# Patient Record
Sex: Female | Born: 1975 | Race: White | Hispanic: No | Marital: Married | Smoking: Never smoker
Health system: Southern US, Community
[De-identification: ages and names within clinical notes are randomized; demographics above are authoritative.]

## PROBLEM LIST (undated history)

## (undated) DIAGNOSIS — Z98891 History of uterine scar from previous surgery: Secondary | ICD-10-CM

## (undated) DIAGNOSIS — O321XX0 Maternal care for breech presentation, not applicable or unspecified: Secondary | ICD-10-CM

## (undated) DIAGNOSIS — O139 Gestational [pregnancy-induced] hypertension without significant proteinuria, unspecified trimester: Secondary | ICD-10-CM

## (undated) DIAGNOSIS — O1490 Unspecified pre-eclampsia, unspecified trimester: Secondary | ICD-10-CM

## (undated) DIAGNOSIS — T7840XA Allergy, unspecified, initial encounter: Secondary | ICD-10-CM

## (undated) DIAGNOSIS — J3089 Other allergic rhinitis: Secondary | ICD-10-CM

## (undated) DIAGNOSIS — R51 Headache: Secondary | ICD-10-CM

## (undated) HISTORY — DX: Unspecified pre-eclampsia, unspecified trimester: O14.90

## (undated) HISTORY — DX: Allergy, unspecified, initial encounter: T78.40XA

## (undated) HISTORY — DX: Headache: R51

## (undated) HISTORY — DX: Other allergic rhinitis: J30.89

## (undated) HISTORY — DX: Maternal care for breech presentation, not applicable or unspecified: O32.1XX0

## (undated) HISTORY — PX: NASAL SEPTUM SURGERY: SHX37

## (undated) HISTORY — DX: History of uterine scar from previous surgery: Z98.891

---

## 2003-03-02 ENCOUNTER — Emergency Department (HOSPITAL_COMMUNITY): Admission: EM | Admit: 2003-03-02 | Discharge: 2003-03-02 | Payer: Self-pay | Admitting: Emergency Medicine

## 2005-01-23 ENCOUNTER — Emergency Department (HOSPITAL_COMMUNITY): Admission: EM | Admit: 2005-01-23 | Discharge: 2005-01-24 | Payer: Self-pay | Admitting: Emergency Medicine

## 2005-02-11 ENCOUNTER — Ambulatory Visit: Payer: Self-pay | Admitting: Internal Medicine

## 2005-04-07 ENCOUNTER — Ambulatory Visit: Payer: Self-pay | Admitting: Internal Medicine

## 2005-04-07 ENCOUNTER — Other Ambulatory Visit: Admission: RE | Admit: 2005-04-07 | Discharge: 2005-04-07 | Payer: Self-pay | Admitting: Internal Medicine

## 2005-07-21 ENCOUNTER — Ambulatory Visit: Payer: Self-pay | Admitting: Family Medicine

## 2005-07-26 ENCOUNTER — Ambulatory Visit: Payer: Self-pay | Admitting: Internal Medicine

## 2005-07-28 ENCOUNTER — Ambulatory Visit: Payer: Self-pay | Admitting: Cardiology

## 2005-07-28 ENCOUNTER — Encounter: Payer: Self-pay | Admitting: Internal Medicine

## 2005-08-17 ENCOUNTER — Encounter: Payer: Self-pay | Admitting: Internal Medicine

## 2005-10-03 ENCOUNTER — Ambulatory Visit: Payer: Self-pay | Admitting: Internal Medicine

## 2005-10-06 ENCOUNTER — Ambulatory Visit: Payer: Self-pay | Admitting: Internal Medicine

## 2005-10-08 ENCOUNTER — Ambulatory Visit: Payer: Self-pay | Admitting: Family Medicine

## 2005-10-24 ENCOUNTER — Ambulatory Visit: Payer: Self-pay | Admitting: Internal Medicine

## 2006-03-31 ENCOUNTER — Ambulatory Visit: Payer: Self-pay | Admitting: Internal Medicine

## 2006-05-10 ENCOUNTER — Ambulatory Visit: Payer: Self-pay | Admitting: Internal Medicine

## 2006-05-10 ENCOUNTER — Other Ambulatory Visit: Admission: RE | Admit: 2006-05-10 | Discharge: 2006-05-10 | Payer: Self-pay | Admitting: Internal Medicine

## 2006-05-10 ENCOUNTER — Encounter: Payer: Self-pay | Admitting: Internal Medicine

## 2006-05-16 ENCOUNTER — Encounter: Payer: Self-pay | Admitting: Internal Medicine

## 2006-05-16 LAB — CONVERTED CEMR LAB

## 2007-02-27 ENCOUNTER — Encounter: Payer: Self-pay | Admitting: Internal Medicine

## 2007-02-27 DIAGNOSIS — G43909 Migraine, unspecified, not intractable, without status migrainosus: Secondary | ICD-10-CM | POA: Insufficient documentation

## 2007-02-27 DIAGNOSIS — J309 Allergic rhinitis, unspecified: Secondary | ICD-10-CM | POA: Insufficient documentation

## 2007-02-28 ENCOUNTER — Ambulatory Visit: Payer: Self-pay | Admitting: Internal Medicine

## 2007-04-02 ENCOUNTER — Ambulatory Visit: Payer: Self-pay | Admitting: Internal Medicine

## 2007-04-02 LAB — CONVERTED CEMR LAB
ALT: 43 units/L — ABNORMAL HIGH (ref 0–40)
AST: 29 units/L (ref 0–37)
Albumin: 3.6 g/dL (ref 3.5–5.2)
Alkaline Phosphatase: 65 units/L (ref 39–117)
BUN: 13 mg/dL (ref 6–23)
Basophils Absolute: 0 10*3/uL (ref 0.0–0.1)
Basophils Relative: 0.7 % (ref 0.0–1.0)
Bilirubin, Direct: 0.1 mg/dL (ref 0.0–0.3)
CO2: 29 meq/L (ref 19–32)
Calcium: 9 mg/dL (ref 8.4–10.5)
Chloride: 109 meq/L (ref 96–112)
Cholesterol: 231 mg/dL (ref 0–200)
Creatinine, Ser: 0.9 mg/dL (ref 0.4–1.2)
Direct LDL: 153.3 mg/dL
Eosinophils Absolute: 0.1 10*3/uL (ref 0.0–0.6)
Eosinophils Relative: 2.7 % (ref 0.0–5.0)
GFR calc Af Amer: 95 mL/min
GFR calc non Af Amer: 79 mL/min
Glucose, Bld: 99 mg/dL (ref 70–99)
HCT: 41.5 % (ref 36.0–46.0)
HDL: 49.7 mg/dL (ref >39.0)
Hemoglobin: 14.2 g/dL (ref 12.0–15.0)
Lymphocytes Relative: 40 % (ref 12.0–46.0)
MCHC: 34.1 g/dL (ref 30.0–36.0)
MCV: 87 fL (ref 78.0–100.0)
Monocytes Absolute: 0.3 10*3/uL (ref 0.2–0.7)
Monocytes Relative: 6.4 % (ref 3.0–11.0)
Neutro Abs: 2.5 10*3/uL (ref 1.4–7.7)
Neutrophils Relative %: 50.2 % (ref 43.0–77.0)
Platelets: 255 10*3/uL (ref 150–400)
Potassium: 4.2 meq/L (ref 3.5–5.1)
RBC: 4.78 M/uL (ref 3.87–5.11)
RDW: 11.5 % (ref 11.5–14.6)
Sodium: 140 meq/L (ref 135–145)
TSH: 2.58 microintl units/mL (ref 0.35–5.50)
Total Bilirubin: 0.8 mg/dL (ref 0.3–1.2)
Total CHOL/HDL Ratio: 4.6
Total Protein: 7.2 g/dL (ref 6.0–8.3)
Triglycerides: 68 mg/dL (ref 0–149)
VLDL: 14 mg/dL (ref 0–40)
WBC: 4.8 10*3/uL (ref 4.5–10.5)

## 2007-05-08 ENCOUNTER — Encounter: Payer: Self-pay | Admitting: Internal Medicine

## 2007-05-08 ENCOUNTER — Other Ambulatory Visit: Admission: RE | Admit: 2007-05-08 | Discharge: 2007-05-08 | Payer: Self-pay | Admitting: Internal Medicine

## 2007-05-08 ENCOUNTER — Ambulatory Visit: Payer: Self-pay | Admitting: Internal Medicine

## 2007-05-08 DIAGNOSIS — R7401 Elevation of levels of liver transaminase levels: Secondary | ICD-10-CM | POA: Insufficient documentation

## 2007-05-08 DIAGNOSIS — R740 Nonspecific elevation of levels of transaminase and lactic acid dehydrogenase [LDH]: Secondary | ICD-10-CM

## 2007-05-08 DIAGNOSIS — E785 Hyperlipidemia, unspecified: Secondary | ICD-10-CM | POA: Insufficient documentation

## 2007-05-09 ENCOUNTER — Encounter: Payer: Self-pay | Admitting: Internal Medicine

## 2007-07-04 ENCOUNTER — Ambulatory Visit: Payer: Self-pay | Admitting: Internal Medicine

## 2007-07-04 LAB — CONVERTED CEMR LAB
ALT: 35 units/L (ref 0–35)
AST: 29 units/L (ref 0–37)
Albumin: 3.6 g/dL (ref 3.5–5.2)
Alkaline Phosphatase: 71 units/L (ref 39–117)
Bilirubin, Direct: 0.1 mg/dL (ref 0.0–0.3)
Cholesterol: 213 mg/dL (ref 0–200)
Direct LDL: 149.2 mg/dL
HDL: 47.6 mg/dL (ref >39.0)
Total Bilirubin: 0.7 mg/dL (ref 0.3–1.2)
Total CHOL/HDL Ratio: 4.5
Total Protein: 7 g/dL (ref 6.0–8.3)
Triglycerides: 54 mg/dL (ref 0–149)
VLDL: 11 mg/dL (ref 0–40)

## 2007-07-11 ENCOUNTER — Ambulatory Visit: Payer: Self-pay | Admitting: Internal Medicine

## 2007-07-11 DIAGNOSIS — Z8719 Personal history of other diseases of the digestive system: Secondary | ICD-10-CM | POA: Insufficient documentation

## 2007-07-11 DIAGNOSIS — R51 Headache: Secondary | ICD-10-CM | POA: Insufficient documentation

## 2007-07-11 DIAGNOSIS — R519 Headache, unspecified: Secondary | ICD-10-CM | POA: Insufficient documentation

## 2007-08-14 ENCOUNTER — Ambulatory Visit: Payer: Self-pay | Admitting: Internal Medicine

## 2007-08-14 DIAGNOSIS — F4321 Adjustment disorder with depressed mood: Secondary | ICD-10-CM | POA: Insufficient documentation

## 2007-08-14 DIAGNOSIS — G4700 Insomnia, unspecified: Secondary | ICD-10-CM | POA: Insufficient documentation

## 2007-08-21 LAB — CONVERTED CEMR LAB
Free T4: 0.7 ng/dL (ref 0.6–1.6)
TSH: 2.11 microintl units/mL (ref 0.35–5.50)

## 2007-09-10 ENCOUNTER — Ambulatory Visit: Payer: Self-pay | Admitting: Internal Medicine

## 2007-09-20 ENCOUNTER — Encounter: Payer: Self-pay | Admitting: Internal Medicine

## 2007-10-01 ENCOUNTER — Ambulatory Visit: Payer: Self-pay | Admitting: Internal Medicine

## 2007-11-06 ENCOUNTER — Ambulatory Visit: Payer: Self-pay | Admitting: Internal Medicine

## 2007-11-06 DIAGNOSIS — L708 Other acne: Secondary | ICD-10-CM | POA: Insufficient documentation

## 2007-11-21 ENCOUNTER — Telehealth: Payer: Self-pay | Admitting: Internal Medicine

## 2007-11-26 ENCOUNTER — Telehealth: Payer: Self-pay | Admitting: Internal Medicine

## 2007-11-30 ENCOUNTER — Encounter: Payer: Self-pay | Admitting: Internal Medicine

## 2008-01-14 ENCOUNTER — Ambulatory Visit: Payer: Self-pay | Admitting: Internal Medicine

## 2008-04-01 ENCOUNTER — Telehealth: Payer: Self-pay | Admitting: *Deleted

## 2008-06-04 ENCOUNTER — Ambulatory Visit: Payer: Self-pay | Admitting: Internal Medicine

## 2008-06-04 LAB — CONVERTED CEMR LAB
Bilirubin Urine: NEGATIVE
Glucose, Urine, Semiquant: NEGATIVE
Ketones, urine, test strip: NEGATIVE
Nitrite: NEGATIVE
Protein, U semiquant: NEGATIVE
Specific Gravity, Urine: >=1.03
Urobilinogen, UA: 0.2
WBC Urine, dipstick: NEGATIVE
pH: 5

## 2008-06-12 LAB — CONVERTED CEMR LAB
ALT: 40 units/L — ABNORMAL HIGH (ref 0–35)
AST: 35 units/L (ref 0–37)
Albumin: 3.6 g/dL (ref 3.5–5.2)
Alkaline Phosphatase: 65 units/L (ref 39–117)
BUN: 11 mg/dL (ref 6–23)
Basophils Absolute: 0.1 10*3/uL (ref 0.0–0.1)
Basophils Relative: 1 % (ref 0.0–3.0)
Bilirubin, Direct: 0.1 mg/dL (ref 0.0–0.3)
CO2: 28 meq/L (ref 19–32)
Calcium: 9 mg/dL (ref 8.4–10.5)
Chloride: 108 meq/L (ref 96–112)
Cholesterol: 193 mg/dL (ref 0–200)
Creatinine, Ser: 1 mg/dL (ref 0.4–1.2)
Eosinophils Absolute: 0.2 10*3/uL (ref 0.0–0.7)
Eosinophils Relative: 3 % (ref 0.0–5.0)
GFR calc Af Amer: 84 mL/min
GFR calc non Af Amer: 69 mL/min
Glucose, Bld: 108 mg/dL — ABNORMAL HIGH (ref 70–99)
HCT: 40.9 % (ref 36.0–46.0)
HDL: 44.4 mg/dL (ref >39.0)
Hemoglobin: 14.3 g/dL (ref 12.0–15.0)
LDL Cholesterol: 134 mg/dL — ABNORMAL HIGH (ref 0–99)
Lymphocytes Relative: 29 % (ref 12.0–46.0)
MCHC: 34.9 g/dL (ref 30.0–36.0)
MCV: 88.8 fL (ref 78.0–100.0)
Monocytes Absolute: 0.3 10*3/uL (ref 0.1–1.0)
Monocytes Relative: 5.8 % (ref 3.0–12.0)
Neutro Abs: 3.6 10*3/uL (ref 1.4–7.7)
Neutrophils Relative %: 61.2 % (ref 43.0–77.0)
Platelets: 306 10*3/uL (ref 150–400)
Potassium: 4 meq/L (ref 3.5–5.1)
RBC: 4.6 M/uL (ref 3.87–5.11)
RDW: 12.1 % (ref 11.5–14.6)
Sodium: 143 meq/L (ref 135–145)
TSH: 2.84 microintl units/mL (ref 0.35–5.50)
Total Bilirubin: 0.5 mg/dL (ref 0.3–1.2)
Total CHOL/HDL Ratio: 4.3
Total Protein: 7.2 g/dL (ref 6.0–8.3)
Triglycerides: 71 mg/dL (ref 0–149)
VLDL: 14 mg/dL (ref 0–40)
WBC: 5.9 10*3/uL (ref 4.5–10.5)

## 2008-07-16 ENCOUNTER — Ambulatory Visit: Payer: Self-pay | Admitting: Internal Medicine

## 2008-07-16 DIAGNOSIS — R739 Hyperglycemia, unspecified: Secondary | ICD-10-CM | POA: Insufficient documentation

## 2008-07-16 LAB — CONVERTED CEMR LAB: Blood Glucose, Fingerstick: 114

## 2008-07-22 ENCOUNTER — Telehealth: Payer: Self-pay | Admitting: Internal Medicine

## 2008-08-13 ENCOUNTER — Ambulatory Visit: Payer: Self-pay | Admitting: Internal Medicine

## 2008-08-13 LAB — CONVERTED CEMR LAB
Hep B Core Total Ab: NEGATIVE
Hep B S Ab: NEGATIVE

## 2008-08-14 ENCOUNTER — Encounter: Payer: Self-pay | Admitting: Internal Medicine

## 2008-08-14 LAB — CONVERTED CEMR LAB
Albumin: 3.6 g/dL (ref 3.5–5.2)
Alkaline Phosphatase: 66 units/L (ref 39–117)
Bilirubin, Direct: 0.1 mg/dL (ref 0.0–0.3)
Total Bilirubin: 0.6 mg/dL (ref 0.3–1.2)

## 2008-12-17 ENCOUNTER — Encounter: Payer: Self-pay | Admitting: Internal Medicine

## 2009-05-05 ENCOUNTER — Telehealth: Payer: Self-pay | Admitting: *Deleted

## 2009-08-31 ENCOUNTER — Ambulatory Visit: Payer: Self-pay | Admitting: Internal Medicine

## 2009-08-31 LAB — CONVERTED CEMR LAB
ALT: 25 units/L (ref 0–35)
Albumin: 3.7 g/dL (ref 3.5–5.2)
BUN: 11 mg/dL (ref 6–23)
Basophils Relative: 0.9 % (ref 0.0–3.0)
Bilirubin Urine: NEGATIVE
Chloride: 105 meq/L (ref 96–112)
Cholesterol: 186 mg/dL (ref 0–200)
Eosinophils Relative: 1.5 % (ref 0.0–5.0)
Glucose, Urine, Semiquant: NEGATIVE
HCT: 42 % (ref 36.0–46.0)
Hemoglobin: 14.4 g/dL (ref 12.0–15.0)
LDL Cholesterol: 118 mg/dL — ABNORMAL HIGH (ref 0–99)
Lymphs Abs: 2 10*3/uL (ref 0.7–4.0)
MCV: 88.5 fL (ref 78.0–100.0)
Monocytes Absolute: 0.4 10*3/uL (ref 0.1–1.0)
Neutro Abs: 5.5 10*3/uL (ref 1.4–7.7)
Platelets: 273 10*3/uL (ref 150.0–400.0)
Potassium: 4.1 meq/L (ref 3.5–5.1)
RBC: 4.75 M/uL (ref 3.87–5.11)
TSH: 2.71 microintl units/mL (ref 0.35–5.50)
Total Protein: 7.5 g/dL (ref 6.0–8.3)
Urobilinogen, UA: 0.2
WBC: 8.1 10*3/uL (ref 4.5–10.5)

## 2009-09-14 ENCOUNTER — Ambulatory Visit: Payer: Self-pay | Admitting: Internal Medicine

## 2009-09-14 DIAGNOSIS — N970 Female infertility associated with anovulation: Secondary | ICD-10-CM | POA: Insufficient documentation

## 2009-09-14 LAB — CONVERTED CEMR LAB: Blood Glucose, Fingerstick: 111

## 2009-12-07 ENCOUNTER — Ambulatory Visit: Payer: Self-pay | Admitting: Internal Medicine

## 2009-12-14 ENCOUNTER — Ambulatory Visit: Payer: Self-pay | Admitting: Internal Medicine

## 2009-12-15 ENCOUNTER — Encounter: Payer: Self-pay | Admitting: Internal Medicine

## 2010-02-10 ENCOUNTER — Telehealth: Payer: Self-pay | Admitting: *Deleted

## 2010-02-10 ENCOUNTER — Ambulatory Visit: Payer: Self-pay | Admitting: Internal Medicine

## 2010-03-02 ENCOUNTER — Ambulatory Visit: Payer: Self-pay | Admitting: Internal Medicine

## 2010-04-12 ENCOUNTER — Telehealth: Payer: Self-pay | Admitting: *Deleted

## 2010-04-23 ENCOUNTER — Ambulatory Visit: Payer: Self-pay | Admitting: Internal Medicine

## 2010-05-12 ENCOUNTER — Encounter: Payer: Self-pay | Admitting: Internal Medicine

## 2010-05-17 ENCOUNTER — Telehealth: Payer: Self-pay | Admitting: *Deleted

## 2010-09-07 ENCOUNTER — Ambulatory Visit: Payer: Self-pay | Admitting: Internal Medicine

## 2010-09-07 LAB — CONVERTED CEMR LAB
AST: 16 units/L (ref 0–37)
Albumin: 2.7 g/dL — ABNORMAL LOW (ref 3.5–5.2)
Alkaline Phosphatase: 64 units/L (ref 39–117)
Basophils Absolute: 0 10*3/uL (ref 0.0–0.1)
Bilirubin Urine: NEGATIVE
Bilirubin, Direct: 0 mg/dL (ref 0.0–0.3)
Blood in Urine, dipstick: NEGATIVE
Calcium: 8.8 mg/dL (ref 8.4–10.5)
Cholesterol: 276 mg/dL — ABNORMAL HIGH (ref 0–200)
Creatinine, Ser: 0.7 mg/dL (ref 0.4–1.2)
Eosinophils Absolute: 0.1 10*3/uL (ref 0.0–0.7)
GFR calc non Af Amer: 96.09 mL/min (ref >60)
Glucose, Bld: 120 mg/dL — ABNORMAL HIGH (ref 70–99)
HDL: 78.8 mg/dL (ref >39.00)
Hemoglobin: 12.7 g/dL (ref 12.0–15.0)
Ketones, urine, test strip: NEGATIVE
Lymphocytes Relative: 18.1 % (ref 12.0–46.0)
MCHC: 33.9 g/dL (ref 30.0–36.0)
Monocytes Relative: 4.8 % (ref 3.0–12.0)
Neutro Abs: 6.9 10*3/uL (ref 1.4–7.7)
Neutrophils Relative %: 75.9 % (ref 43.0–77.0)
Nitrite: NEGATIVE
RDW: 13.4 % (ref 11.5–14.6)
Sodium: 139 meq/L (ref 135–145)
Total Bilirubin: 0.3 mg/dL (ref 0.3–1.2)
Total CHOL/HDL Ratio: 4
Triglycerides: 176 mg/dL — ABNORMAL HIGH (ref 0.0–149.0)
Urobilinogen, UA: 0.2

## 2010-09-15 ENCOUNTER — Ambulatory Visit: Payer: Self-pay | Admitting: Internal Medicine

## 2010-09-15 DIAGNOSIS — R809 Proteinuria, unspecified: Secondary | ICD-10-CM | POA: Insufficient documentation

## 2010-09-20 LAB — CONVERTED CEMR LAB
Creatinine,U: 122.2 mg/dL
Microalb Creat Ratio: 0.9 mg/g (ref 0.0–30.0)
Microalb, Ur: 1.1 mg/dL (ref 0.0–1.9)

## 2010-11-09 ENCOUNTER — Encounter
Admission: RE | Admit: 2010-11-09 | Discharge: 2010-11-16 | Payer: Self-pay | Source: Home / Self Care | Attending: Obstetrics and Gynecology | Admitting: Obstetrics and Gynecology

## 2010-11-10 ENCOUNTER — Encounter
Admit: 2010-11-10 | Discharge: 2010-11-16 | Payer: Self-pay | Attending: Obstetrics and Gynecology | Admitting: Obstetrics and Gynecology

## 2010-11-16 NOTE — Assessment & Plan Note (Signed)
Summary: CPX/CJR   Vital Signs:  Patient profile:   35 year old female Menstrual status:  regular LMP:     03/17/2010 Height:      67 inches Weight:      236 pounds Temp:     98.9 degrees F oral Pulse rate:   110 / minute BP sitting:   112 / 74  (left arm) Cuff size:   large  Vitals Entered By: Alfred Levins, CMA (September 15, 2010 2:06 PM) CC: cpx, no pap LMP (date): 03/17/2010 EDC by LMP==> 12/30/2010 EDC 12/30/2010 LMP - Character: light LMP - Reliable? approximate (month known) Menarche (age onset years): 12-13   Menses interval (days): 28 Menstrual flow (days): 5-6 Enter LMP: 03/17/2010 Last PAP Result normal   History of Present Illness: Sandra Hudson comes in today  for preventive visit . Since last visit  here  there have been no major changes in health status  but  is pregnant [redacted] weeks or so  and now not working . thus stress is less.   Her  HAs are better.   EDC march 15 . No complicaitons of pregnancy.   nl bp and initial bg screen negative but rechecking  next week.  had uti early in november now better . Mood: so so difficult to tell with  pregancy and  staying at home.  Takind wellbutrin Herman and ? if cymbalta or prozac .   Seeing counselor reg who rec stay on med if possible  until 4 months PP  to avoid relapse.    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 1 By Mouth Once Daily 2)  Zolpidem Tartrate 10 Mg Tabs (Zolpidem Tartrate) .Marland Kitchen.. 1 By Mouth At Bedtime 3)  Wellbutrin Xl 150 Mg  Tb24 (Bupropion Hcl) .... 2 By Mouth Once Daily 4)  Flonase 50 Mcg/act Susp (Fluticasone Propionate) .... 2 Sprays Each Nares Q D 5)  Cymbalta 60 Mg Cpep (Duloxetine Hcl) .Marland Kitchen.. 1 By Mouth Once Daily 6)  Ativan 0.5 Mg Tabs (Lorazepam) .Marland Kitchen.. 1 By Mouth Two Times A Day As Needed Anxiety  Allergies (verified): No Known Drug Allergies  Past History:  Past medical, surgical, family and social histories (including risk factors) reviewed, and no changes noted (except as noted below).  Past Medical History: Reviewed history  from 02/10/2010 and no changes required. Allergic rhinitis dust mites See preload Headache Infertility treatment [facility]  bates ent midwife  Past Surgical History: Reviewed history from 04/23/2010 and no changes required. Nasal Surgery for Deviated Septum and large turbinate  Past History:  Care Management: ENT: Jenne Pane Gynecology: Erin Sons- Midwife   central [facility]   Psychologist: Gunnar Fusi PIle   Family History: Reviewed history from 12/14/2009 and no changes required. See Preload no thyroid disease. Father has thrasing light sleeper. Multiple system atrophy  progressing.  .   died 1 2010/03/03  Social History: Reviewed history from 12/14/2009 and no changes required. 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   Review of Systems  The patient denies abdominal pain, melena, hematochezia, severe indigestion/heartburn, muscle weakness, abnormal bleeding, enlarged lymph nodes, angioedema, and breast masses.         fell down stairs  a while back  and had  elbow. pain  nl now. Neg cv pulmon  gi ,gu changes .    Physical Exam  General:  Well-developed,well-nourished,in no acute distress; alert,appropriate and cooperative throughout examination Head:  normocephalic and atraumatic.   Eyes:  PERRL, EOMs full, conjunctiva clear  Ears:  R ear normal, L ear normal, and no external deformities.   Nose:  no external deformity, no external erythema, and no nasal discharge.   Mouth:  good dentition and pharynx pink and moist.   Neck:  No deformities, masses, or tenderness noted. Breasts:  No mass, nodules, thickening, tenderness, bulging, retraction, inflamation, nipple discharge or skin changes noted.   Lungs:  Normal respiratory effort, chest expands symmetrically. Lungs are clear to auscultation, no crackles or wheezes. Heart:  Normal rate and regular rhythm. S1 and S2 normal without gallop, murmur, click, rub or other extra sounds.no gallop.   Abdomen:  soft, non-tender, no hepatomegaly, and no splenomegaly.  fundul up tp below umbilicus non tender Msk:  no joint swelling, no joint warmth, and no redness over joints.   Pulses:  pulses intact without delay   Extremities:  no clubbing cyanosis or edema  Neurologic:  alert & oriented X3, strength normal in all extremities, and gait normal.  non focal  Skin:  turgor normal, color normal, no petechiae, and no purpura.  acne on back som pustules Cervical Nodes:  No lymphadenopathy noted Axillary Nodes:  No palpable lymphadenopathy Inguinal Nodes:  No significant adenopathy Psych:  Oriented X3, memory  intact for recent and remote, good eye contact, not anxious appearing, and not depressed appearing.  midly subdued    Impression & Recommendations:  Problem # 1:  PREVENTIVE HEALTH CARE (ICD-V70.0) Discussed nutrition,exercise,diet,healthy weight, vitamin D and calcium.   utd on shots   Problem # 2:  DISORDER, ADJUSTMENT W/DEPRESSED MOOD (ICD-309.0) Assessment: Unchanged stable  discmeds  will confirm meds and dosing and will do refill s / if on fluoextine instead of cymbalta  Problem # 3:  HYPERGLYCEMIA (ICD-790.29) fbs 120 and pregnancy    to be tested soon  will send results to gyne  Problem # 4:  PROTEINURIA (ICD-791.0) with albumin 2.9 / dilutional   check urine   had recent uti and could be related Orders: TLB-Microalbumin/Creat Ratio, Urine (82043-MALB)  Problem # 5:  HYPERLIPIDEMIA NEC/NOS (ICD-272.4) high  lifestyle intervention compatable with pregnancy  Labs Reviewed: SGOT:  16 (09/07/2010)   SGPT: 16 (09/07/2010)   HDL:78.80 (09/07/2010), 46.70 (08/31/2009)  LDL:118 (08/31/2009), 134 (06/04/2008)  Chol:276 (09/07/2010), 186 (08/31/2009)  Trig:176.0 (09/07/2010), 105.0 (08/31/2009)  Problem # 6:  HEADACHE (ICD-784.0) Assessment: Improved  The following medications were removed from the medication list:    Imitrex 100 Mg Tabs (Sumatriptan succinate) .Marland Kitchen... Take 1 tablet by mouth as directed  Problem # 7:  PREGNANT STATE, INCIDENTAL (ICD-V22.2) disc tdap  and fluimmunix  for care givers and HH members    Problem # 8:  albumin 2.9  check urine may be dilutional  Complete Medication List: 1)  Zyrtec Allergy 10 Mg Tabs (Cetirizine hcl) .Marland Kitchen.. 1 by mouth once daily 2)  Zolpidem Tartrate 10 Mg Tabs (Zolpidem tartrate) .Marland Kitchen.. 1 by mouth at bedtime 3)  Wellbutrin Xl 150 Mg Tb24 (Bupropion hcl) .... 2 by mouth once daily 4)  Flonase 50 Mcg/act Susp (Fluticasone propionate) .... 2 sprays each nares q d 5)  Cymbalta 60 Mg Cpep (Duloxetine hcl) .Marland Kitchen.. 1 by mouth once daily 6)   Ativan 0.5 Mg Tabs (Lorazepam) .Marland Kitchen.. 1 by mouth two times a day as needed anxiety  Patient Instructions: 1)  will send  results  to your ob gyne office  2)  healthy eating. 3)  You will be informed of lab results when available.  4)  have pharmacy call with other refills   5)  rov in 6 months or when appropriate.  Prescriptions: WELLBUTRIN XL 150 MG  TB24 (BUPROPION HCL) 2 by mouth once daily  #60 Tablet x 5   Entered and Authorized by:   [name] MD   Signed by:   [name] MD on 09/15/2010   Method used:   Electronically to        [facility]. [phone]* (retail)       [phone]       [place], [place]  [address]       Ph: [phone]       Fax: [phone]   RxID:   [id number] ZOLPIDEM TARTRATE 10 MG TABS (ZOLPIDEM TARTRATE) 1 by mouth at bedtime  #30 x 3   Entered and Authorized by:   [name] MD   Signed by:   [name] MD on 09/15/2010   Method used:   Print then Give to Patient   RxID:   [id number]    Orders Added: 1)  TLB-Microalbumin/Creat Ratio, Urine [82043-MALB] 2)  Est. Patient 18-39 years [99395] 3)  Est. Patient Level II [59563]

## 2010-11-16 NOTE — Medication Information (Signed)
Summary: Coverage for Lunesta Denied  Coverage for Lunesta Denied   Imported By: Maryln Gottron 05/21/2010 13:09:22  _____________________________________________________________________  External Attachment:    Type:   Image     Comment:   External Document

## 2010-11-16 NOTE — Medication Information (Signed)
Summary: Prior Authorization Request and Approval for Lunesta  Prior Authorization Request and Approval for Lunesta   Imported By: Maryln Gottron 12/18/2009 11:23:11  _____________________________________________________________________  External Attachment:    Type:   Image     Comment:   External Document

## 2010-11-16 NOTE — Assessment & Plan Note (Signed)
Summary: 3 month fup//ccm   Vital Signs:  Patient profile:   35 year old female Menstrual status:  regular LMP:     12/09/2009 Weight:      212 pounds Pulse rate:   72 / minute BP sitting:   110 / 60  (right arm) Cuff size:   large  Vitals Entered By: Romualdo Bolk, CMA (AAMA) (December 14, 2009 1:43 PM) CC: Follow-up visit on meds LMP (date): 12/09/2009 LMP - Character: light LMP - Reliable? Yes Menarche (age onset years): 12-13   Menses interval (days): 28 Menstrual flow (days): 5-6 Enter LMP: 12/09/2009 Last PAP Result normal   History of Present Illness: Sandra Hudson comes in  today for  fu of a number of issues : Since last visit her father has died and   work situation is abit stressful . HA   ocass  use of meds   periods  1-2 x per months doing better since on sleep meds . Sleep : still issue at times  lunesta helps  Mood.  Ok considering fathers death from debilitating disease. in January.  no se of meds .   Fertility :   back onocps for awhile  willbe going off later. BG not checking at present.   Preventive Screening-Counseling & Management  Alcohol-Tobacco     Alcohol drinks/day: <1     Alcohol type: wine, mixed drinks     Smoking Status: never  Caffeine-Diet-Exercise     Caffeine use/day: less than 1 a day     Does Patient Exercise: no  Current Medications (verified): 1)  Imitrex 100 Mg Tabs (Sumatriptan Succinate) .... Take 1 Tablet By Mouth As Directed 2)  Zyrtec Allergy 10 Mg  Tabs (Cetirizine Hcl) .Marland Kitchen.. 1 By Mouth Once Daily 3)  Lunesta 3 Mg  Tabs (Eszopiclone) .Marland Kitchen.. 1 Hs As Directed 4)  Fluoxetine Hcl 20 Mg  Tabs (Fluoxetine Hcl) .Marland Kitchen.. 1 By Mouth Two Times A Day 5)  Wellbutrin Xl 150 Mg  Tb24 (Bupropion Hcl) .... 2 By Mouth Once Daily 6)  Flonase 50 Mcg/act Susp (Fluticasone Propionate) .... 2 Sprays Each Nares Q D 7)  Yaz 3-0.02 Mg Tabs (Drospirenone-Ethinyl Estradiol)  Allergies (verified): No Known Drug Allergies  Past History:  Past  medical, surgical, family and social histories (including risk factors) reviewed, and no changes noted (except as noted below).  Past Medical History: Reviewed history from 07/16/2008 and no changes required. Allergic rhinitis dust mites See preload Headache  [facility]  bates ent midwife  Past Surgical History: Reviewed history from 09/10/2007 and no changes required. Nasal Surgery for Devated Spetum and large turbinate  Past History:  Care Management: ENT: Jenne Pane Gynecology: Erin Sons- Midwife  Family History: Reviewed history from 09/14/2009 and no changes required. See Preload no thyroid disease. Father has thrasing light sleeper. Multiple system atrophy  progressing.  .   died 1 2010/02/26  Social History: Reviewed history from 07/16/2008 and no changes required. 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    Physical Exam  General:  alert, well-developed, well-nourished, and well-hydrated.   Head:  normocephalic, atraumatic, and no abnormalities observed.  Eyes:  vision grossly intact and pupils equal.   Neck:  No deformities, masses, or tenderness noted. Lungs:  Normal respiratory effort, chest expands symmetrically. Lungs are clear to auscultation, no crackles or wheezes.no dullness.   Heart:  Normal rate and regular rhythm. S1 and S2 normal without gallop, murmur, click, rub or other extra sounds. Abdomen:  Bowel sounds positive,abdomen soft and non-tender without masses, organomegaly or    noted. Pulses:  nl cap refill  Extremities:  no clubbing cyanosis or edema  Neurologic:  non focal alert & oriented X3 and gait normal.   Skin:  turgor normal, color normal, no ecchymoses, and no petechiae.   Cervical Nodes:  No lymphadenopathy noted Psych:  Oriented X3, memory intact for recent and remote, normally interactive, good eye contact, not anxious appearing, and subdued.   normal for situation and speech nl.    Impression & Recommendations:  Problem # 1:  HYPERGLYCEMIA (ICD-790.29) Assessment Improved hg a1c 5.8    Problem # 2:  HEADACHE (ICD-784.0) Assessment: Improved stable  Her updated medication list for this problem includes:    Imitrex 100 Mg Tabs (Sumatriptan succinate) .Marland Kitchen... Take 1 tablet by mouth as directed  Problem # 3:  DISORDER, ADJUSTMENT W/DEPRESSED MOOD (ICD-309.0) doing as expected with fathers death   counseled   Problem # 4:  SLEEPLESSNESS (ICD-780.52) Assessment: Unchanged stable Her updated medication list for this problem includes:    Lunesta 3 Mg Tabs (Eszopiclone) .Marland Kitchen... 1 hs as directed  Problem # 5:  INFERTILITY, ANOVULATORY (ICD-628.0) will try again later  weight loss would help.     Complete Medication List: 1)  Imitrex 100 Mg Tabs (Sumatriptan succinate) .... Take 1 tablet by mouth as directed 2)  Zyrtec Allergy 10 Mg Tabs (Cetirizine hcl) .Marland Kitchen.. 1 by mouth once daily 3)  Lunesta 3 Mg Tabs (Eszopiclone) .Marland Kitchen.. 1 hs as directed 4)  Fluoxetine Hcl 20 Mg Tabs (Fluoxetine hcl) .Marland Kitchen.. 1 by mouth two times a day 5)  Wellbutrin Xl 150 Mg Tb24 (Bupropion hcl) .... 2 by mouth once daily 6)  Flonase 50 Mcg/act Susp (Fluticasone propionate) .... 2 sprays each nares q d 7)  Yaz 3-0.02 Mg Tabs (Drospirenone-ethinyl estradiol)  Patient Instructions: 1)  no change  in medicine today  2)  call if   needed otherwise   get cpx with labs in the summer. Prescriptions: LUNESTA 3 MG  TABS (ESZOPICLONE) 1 hs as directed  #30 x 5   Entered and Authorized by:    [name] MD   Signed by:   [name] MD on 12/14/2009   Method used:   Print then Give to Patient   RxID:   [id number] FLUOXETINE HCL 20 MG  TABS (FLUOXETINE HCL) 1 by mouth two times a day  #60 Capsule x 6   Entered and Authorized by:   [name] MD   Signed by:   [name] MD on 12/14/2009   Method used:   Electronically to        [facility]. [phone]* (retail)       [phone]       [place], [place]  [address]       Ph: [phone]       Fax: [phone]   RxID:   [id number] WELLBUTRIN XL 150 MG  TB24 (BUPROPION HCL) 2 by mouth once daily  #60 x 5   Entered and Authorized  by:   [name] MD   Signed by:   [name] MD on 12/14/2009   Method used:   Electronically to        [facility]. [phone]* (retail)       [phone]       [place], [place]  [address]       Ph: [phone]       Fax: [phone]   RxID:   [id number]  greater than 50% of visit spent in counseling   25 min

## 2010-11-16 NOTE — Progress Notes (Signed)
  Phone Note Other Incoming   Caller: Dr Gunnar Fusi PIle Psychologist  Summary of Call: Counselor  saw pt today and suggested  adding low dose benzo  short term to see if would help  situation  until better . She also rec to pt to take off a few days and follow up . agreed with changing med type.   Will do this tomorrow am.  Please call in Ativan .5 mg  1  by mouth two times a day  as needed for anxiety.   Disp 30  # call for refills before visit if needed. Please call patient when it has been sent in  Initial call taken by: [name] MD,  February 10, 2010 7:10 PM  Follow-up for Phone Call        Left message for pt to call back. Rx called into [facility]. Follow-up by: Romualdo Bolk, CMA Duncan Dull),  February 11, 2010 9:09 AM  Additional Follow-up for Phone Call Additional follow up Details #1::        Pt aware and wants rx called into [facility] Additional Follow-up by: Romualdo Bolk, CMA (AAMA),  February 11, 2010 9:59 AM    New/Updated Medications: ATIVAN 0.5 MG TABS (LORAZEPAM) 1 by mouth two times a day as needed anxiety Prescriptions: ATIVAN 0.5 MG TABS (LORAZEPAM) 1 by mouth two times a day as needed anxiety  #30 x 0   Entered by:   Romualdo Bolk, CMA (AAMA)   Authorized by:   [name] MD   Signed by:   Romualdo Bolk, CMA (AAMA) on 02/11/2010   Method used:   Telephoned to ...       [facility]. [phone]* (retail)       [phone]       [place], [place]  [address]       Ph: [phone]       Fax: [phone]   RxID:   [id number]

## 2010-11-16 NOTE — Progress Notes (Signed)
Summary: Pt req samples for Cymbalta or script called in to [facility]  Phone Note Refill Request Call back at [phone] cell Message from:  Patient  Refills Requested: Medication #1:  CYMBALTA 60 MG CPEP 1 by mouth once daily Pt is completely out of Cymbalta samples and would like to req either more samples or Dr. Fabian Sharp can call in a script o [facility].   Initial call taken by: Lucy Antigua,  April 12, 2010 3:08 PM  Follow-up for Phone Call        Per Dr. Fabian Sharp- Okay to do samples and rx. Samples up front and rx sent to pharmacy. Follow-up by: Romualdo Bolk, CMA (AAMA),  April 12, 2010 4:55 PM    BPrescriptions: CYMBALTA 60 MG CPEP (DULOXETINE HCL) 1 by mouth once daily  #30 x 5   Entered by:   Romualdo Bolk, CMA (AAMA)   Authorized by:   [name] MD   Signed by:   Romualdo Bolk, CMA (AAMA) on 04/12/2010   Method used:   Electronically to        [facility]. [phone]* (retail)       [phone]       [place], [place]  [address]       Ph: [phone]       Fax: [phone]   RxID:   [id number]

## 2010-11-16 NOTE — Assessment & Plan Note (Signed)
Summary: 3 wk rov/njr   Vital Signs:  Patient profile:   35 year old female Menstrual status:  regular Height:      67.5 inches Weight:      212.6 pounds BMI:     32.92 Temp:     98.3 degrees F oral BP sitting:   110 / 70  (right arm)  Vitals Entered By: Kathrynn Speed CMA (Mar 02, 2010 2:24 PM) CC: ROV meds Cymbalta, Headache   History of Present Illness: Sandra Hudson comes  in for follow up  Doing some better .  used anxiety med  yesterday   during class.      with help  Still seeing counselor   weekly.   Sleep not as well.   Off ocps help some of mood  HA issue with sleep but no deterioration.    Preventive Screening-Counseling & Management  Alcohol-Tobacco     Alcohol drinks/day: <1     Alcohol type: wine, mixed drinks     Smoking Status: never  Caffeine-Diet-Exercise     Caffeine use/day: less than 1 a day     Does Patient Exercise: no  Current Medications (verified): 1)  Imitrex 100 Mg Tabs (Sumatriptan Succinate) .... Take 1 Tablet By Mouth As Directed 2)  Zyrtec Allergy 10 Mg  Tabs (Cetirizine Hcl) .Marland Kitchen.. 1 By Mouth Once Daily 3)  Lunesta 3 Mg  Tabs (Eszopiclone) .Marland Kitchen.. 1 Hs As Directed 4)  Wellbutrin Xl 150 Mg  Tb24 (Bupropion Hcl) .... 2 By Mouth Once Daily 5)  Flonase 50 Mcg/act Susp (Fluticasone Propionate) .... 2 Sprays Each Nares Q D 6)  Yaz 3-0.02 Mg Tabs (Drospirenone-Ethinyl Estradiol) 7)  Cymbalta 60 Mg Cpep (Duloxetine Hcl) .Marland Kitchen.. 1 By Mouth Once Daily 8)  Ativan 0.5 Mg Tabs (Lorazepam) .Marland Kitchen.. 1 By Mouth Two Times A Day As Needed Anxiety  Allergies (verified): No Known Drug Allergies  Past History:  Past medical, surgical, family and social histories (including risk factors) reviewed for relevance to current acute and chronic problems.  Past Medical History: Reviewed history from 02/10/2010 and no changes required. Allergic rhinitis dust mites See preload Headache Infertility treatment [facility]  bates ent midwife  Past Surgical  History: Reviewed history from 02/10/2010 and no changes required. Nasal Surgery for Deviated Spetum and large turbinate  Family History: Reviewed history from 12/14/2009 and no changes required. See Preload no thyroid disease. Father has thrasing light sleeper. Multiple system atrophy  progressing.  .   died 1 03/11/2010  Social History: Reviewed history from 12/14/2009 and no changes required. 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   Psych:  Oriented X3, normally interactive, good eye contact, and not anxious appearing.  mildly subdued   brighter affect    Impression & Recommendations:  Problem # 1:  DISORDER, ADJUSTMENT W/DEPRESSED MOOD (ICD-309.0) Assessment Improved some improvement   samples given to continue on same dose .   Problem # 2:  HEADACHE (ICD-784.0)  Her updated medication list for this problem includes:    Imitrex 100 Mg Tabs (Sumatriptan succinate) .Marland Kitchen... Take 1 tablet by mouth as directed  Problem # 3:  SLEEPLESSNESS (ICD-780.52) Assessment: Unchanged ocass wakenings but can go back to sleep  Her updated medication list for this problem includes:  Lunesta 3 Mg Tabs (Eszopiclone) .Marland Kitchen... 1 hs as directed  Problem # 4:  ALLERGIC RHINITIS (ICD-477.9)  Her updated medication list for this problem includes:    Zyrtec Allergy 10 Mg Tabs (Cetirizine hcl) .Marland Kitchen... 1 by mouth once daily    Flonase 50 Mcg/act Susp (Fluticasone propionate) .Marland Kitchen... 2 sprays each nares q d  Complete Medication List: 1)  Imitrex 100 Mg Tabs (Sumatriptan succinate) .... Take 1 tablet by mouth as directed 2)  Zyrtec Allergy 10 Mg Tabs (Cetirizine hcl) .Marland Kitchen.. 1 by mouth once daily 3)  Lunesta 3 Mg Tabs (Eszopiclone) .Marland Kitchen.. 1 hs as directed 4)  Wellbutrin Xl 150 Mg Tb24 (Bupropion hcl) .... 2 by mouth once  daily 5)  Flonase 50 Mcg/act Susp (Fluticasone propionate) .... 2 sprays each nares q d 6)  Yaz 3-0.02 Mg Tabs (Drospirenone-ethinyl estradiol) 7)  Cymbalta 60 Mg Cpep (Duloxetine hcl) .Marland Kitchen.. 1 by mouth once daily 8)  Ativan 0.5 Mg Tabs (Lorazepam) .Marland Kitchen.. 1 by mouth two times a day as needed anxiety  Patient Instructions: 1)  continue on     cymbalta   60   2)  ROV  in about a month    or as needed.  Prescriptions: FLONASE 50 MCG/ACT SUSP (FLUTICASONE PROPIONATE) 2 sprays each nares q d  #16 Gram x 6   Entered and Authorized by:   [name] MD   Signed by:   [name] MD on 03/02/2010   Method used:   Electronically to        [facility]. [phone]* (retail)       [phone]       [place], [place]  [address]       Ph: [phone]       Fax: [phone]   RxID:   [id number]

## 2010-11-16 NOTE — Progress Notes (Signed)
Summary: Change Lunesta to generic Ambien  Phone Note Outgoing Call   Call placed by: Romualdo Bolk, CMA Duncan Dull),  May 17, 2010 5:25 PM Call placed to: Patient Summary of Call: Called pt about denial of prior auth. Pt wants to know what other medication she can take for sleep. Pt's ins will cover generic ambien. Pt is preg. Is this safe for her to take and can we change her Lunesta to this? Initial call taken by: Romualdo Bolk, CMA (AAMA),  May 17, 2010 5:26 PM  Follow-up for Phone Call        ok  to try ambien 10 mg  1 by mouth hs as needed . Preg category B   .   but less is best.  Abien 10 mg  disp #30  refill x 1  Follow-up by: [name] MD,  May 17, 2010 5:32 PM  Additional Follow-up for Phone Call Additional follow up Details #1::        Left message for pt to call back. Additional Follow-up by: Romualdo Bolk, CMA (AAMA),  May 18, 2010 11:42 AM    Additional Follow-up for Phone Call Additional follow up Details #2::    Pt aware and rx called in. Follow-up by: Romualdo Bolk, CMA (AAMA),  May 18, 2010 4:44 PM  New/Updated Medications: ZOLPIDEM TARTRATE 10 MG TABS (ZOLPIDEM TARTRATE) 1 by mouth at bedtime Prescriptions: ZOLPIDEM TARTRATE 10 MG TABS (ZOLPIDEM TARTRATE) 1 by mouth at bedtime  #30 x 1   Entered by:   Romualdo Bolk, CMA (AAMA)   Authorized by:   [name] MD   Signed by:   Romualdo Bolk, CMA (AAMA) on 05/18/2010   Method used:   Telephoned to ...       [facility]. [phone]* (retail)       [phone]       [place], [place]  [address]       Ph: [phone]       Fax: [phone]   RxID:   [id number]

## 2010-11-16 NOTE — Assessment & Plan Note (Signed)
Summary: med check/?increase of dosage/cjr   Vital Signs:  Patient profile:   35 year old female Menstrual status:  regular LMP:     02/03/2010 Weight:      207 pounds Pulse rate:   78 / minute BP sitting:   110 / 60  (right arm) Cuff size:   large  Vitals Entered By: Romualdo Bolk, CMA (AAMA) (February 10, 2010 11:03 AM) CC: Follow-up visit on meds-Pt wants to discuss increasing dosage. Pt states that she has been more fatigue and lack of interest. LMP (date): 02/03/2010 LMP - Character: light LMP - Reliable? Yes Menarche (age onset years): 12-13   Menses interval (days): 28 Menstrual flow (days): 5-6 Enter LMP: 02/03/2010 Last PAP Result normal   History of Present Illness: Sandra Hudson comesin comes in today   for above.  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 Other lossesMoms cat died .  no change with OCP recently started.   More lack of motivation and interest.    Stress at school  anyway.   did travel over break.    did have enjoyable times .   Sleep a lot  more depression than anxiety. To see counselor today. Paula Pile  Preventive Screening-Counseling & Management  Alcohol-Tobacco     Alcohol drinks/day: <1     Alcohol type: wine, mixed drinks     Smoking Status: never  Caffeine-Diet-Exercise     Caffeine use/day: less than 1 a day     Does Patient Exercise: no  Current Medications (verified): 1)  Imitrex 100 Mg Tabs (Sumatriptan Succinate) .... Take 1 Tablet By Mouth As Directed 2)  Zyrtec Allergy 10 Mg  Tabs (Cetirizine Hcl) .Marland Kitchen.. 1 By Mouth Once Daily 3)  Lunesta 3 Mg  Tabs (Eszopiclone) .Marland Kitchen.. 1 Hs As Directed 4)  Fluoxetine Hcl 20 Mg  Tabs (Fluoxetine Hcl) .Marland Kitchen.. 1 By Mouth Two Times A Day 5)  Wellbutrin Xl 150 Mg  Tb24 (Bupropion Hcl) .... 2 By Mouth Once Daily 6)  Flonase 50 Mcg/act Susp (Fluticasone Propionate) .... 2 Sprays Each Nares Q D 7)  Yaz 3-0.02 Mg Tabs  (Drospirenone-Ethinyl Estradiol)  Allergies (verified): No Known Drug Allergies  Past History:  Past medical, surgical, family and social histories (including risk factors) reviewed, and no changes noted (except as noted below).  Past Medical History: Allergic rhinitis dust mites See preload Headache Infertility treatment [facility]  bates ent midwife  Past Surgical History: Nasal Surgery for Deviated Spetum and large turbinate  Past History:  Care Management: ENT: Jenne Pane Gynecology: Erin Sons- Midwife  Psychologist: Gunnar Fusi PIle   Family History: Reviewed history from 12/14/2009 and no changes required. See Preload no thyroid disease. Father has thrasing light sleeper. Multiple system atrophy  progressing.  .   died 1 03-09-2010  Social History: Reviewed history from 12/14/2009 and no changes required. 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    Physical Exam  General:  alert, well-developed, well-nourished, and well-hydrated.   Head:  normocephalic and atraumatic.   Psych:  Oriented X3, memory intact for recent and remote, good eye contact, and not anxious appearing.  slightly down and stressed  . nl cognition  and speech  and though process.   Impression & Recommendations:  Problem # 1:  DISORDER, ADJUSTMENT W/DEPRESSED MOOD (ICD-309.0) Assessment Deteriorated still hasnt have time to process fathers death and very difficult work situation,      reasonable to change to an snri and follow up . to see counselor today to assess whether to take off work a few days is reasonable.     counseled today   Complete Medication List: 1)  Imitrex 100 Mg Tabs (Sumatriptan succinate) .... Take 1 tablet by mouth as directed 2)   Zyrtec Allergy 10 Mg Tabs (Cetirizine hcl) .Marland Kitchen.. 1 by mouth once daily 3)  Lunesta 3 Mg Tabs (Eszopiclone) .Marland Kitchen.. 1 hs as directed 4)  Wellbutrin Xl 150 Mg Tb24 (Bupropion hcl) .... 2 by mouth once daily 5)  Flonase 50 Mcg/act Susp (Fluticasone propionate) .... 2 sprays each nares q d 6)  Yaz 3-0.02 Mg Tabs (Drospirenone-ethinyl estradiol) 7)  Cymbalta 60 Mg Cpep (Duloxetine hcl) .Marland Kitchen.. 1 by mouth once daily  Patient Instructions: 1)  change fluoxetine to   cymbalta 60 mg per day     2)  may have some transitional   signs  3)  rov in 2-3 weeks   4)  disc with counselor today about other  strategies.  if needed can add small amt of prozac   in transition if needed.     greater than 50% of visit spent in counseling  25 minutes .

## 2010-11-16 NOTE — Assessment & Plan Note (Signed)
Summary: POSSIBLE PREGNANCY (PT TO LAB FOR PREGNANCY TEST) // RS   Vital Signs:  Patient profile:   35 year old female Menstrual status:  regular Height:      67.5 inches (171.45 cm) Weight:      213.13 pounds (96.88 kg) O2 Sat:      99 % on Room air Temp:     98.9 degrees F (37.17 degrees C) oral Pulse rate:   122 / minute BP sitting:   122 / 78  (left arm) Cuff size:   large  Vitals Entered By: Josph Macho RMA (April 23, 2010 12:12 PM)  O2 Flow:  Room air CC: Possible pregnancy CF Is Patient Diabetic? No   History of Present Illness: Sandra Hudson comesin for work in [place] with husband for above . is 37 days out from last menses  and usually 32 days.  OTC  urine test one faintly pos  and one positive with plus.   Is a fertility patient and     trying to get pregnandt . SOme bloating and lower abd discomfort mild some breast tendermess. Delorise Shiner LAtham  practitioner.  No bleeding . Is on meds and  list updated aware no imitrex in pregnancy .  takes lunesta regularly and cant sleep much without it .     Preventive Screening-Counseling & Management  Alcohol-Tobacco     Alcohol drinks/day: <1     Alcohol type: wine, mixed drinks     Smoking Status: never  Current Medications (verified): 1)  Imitrex 100 Mg Tabs (Sumatriptan Succinate) .... Take 1 Tablet By Mouth As Directed 2)  Zyrtec Allergy 10 Mg  Tabs (Cetirizine Hcl) .Marland Kitchen.. 1 By Mouth Once Daily 3)  Lunesta 3 Mg  Tabs (Eszopiclone) .Marland Kitchen.. 1 Hs As Directed 4)  Wellbutrin Xl 150 Mg  Tb24 (Bupropion Hcl) .... 2 By Mouth Once Daily 5)  Flonase 50 Mcg/act Susp (Fluticasone Propionate) .... 2 Sprays Each Nares Q D 6)  Cymbalta 60 Mg Cpep (Duloxetine Hcl) .Marland Kitchen.. 1 By Mouth Once Daily 7)  Ativan 0.5 Mg Tabs (Lorazepam) .Marland Kitchen.. 1 By Mouth Two Times A Day As Needed Anxiety  Allergies (verified): No Known Drug Allergies  Past History:  Past medical, surgical, family and social histories (including risk factors) reviewed, and no changes  noted (except as noted below).  Past Medical History: Reviewed history from 02/10/2010 and no changes required. Allergic rhinitis dust mites See preload Headache Infertility treatment [facility]  bates ent midwife  Past Surgical History: Nasal Surgery for Deviated Septum and large turbinate  Family History: Reviewed history from 12/14/2009 and no changes required. See Preload no thyroid disease. Father has thrasing light sleeper. Multiple system atrophy  progressing.  .   died 1 03/05/2010  Social History: Reviewed history from 12/14/2009 and no changes required. 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  The patient denies anorexia, fever, abdominal pain, muscle weakness, difficulty walking, abnormal bleeding, enlarged lymph nodes, and angioedema.    Physical Exam  General:  alert, well-developed, and well-nourished.  here  with husband  Psych:  Oriented X3, normally interactive, good eye contact, not anxious appearing, and not depressed appearing.     Impression & Recommendations:  Problem # 1:  DELAYED MENSES (ICD-626.8)  urine test not that sensisitve early    Do hcg   ...if  positive    will need to  avoid certain meds and arrange management   .    Orders: TLB-Preg Serum Quant (B-hCG) ([id number])  Problem # 2:  INFERTILITY, ANOVULATORY (ICD-628.0) Assessment: Comment Only  Complete Medication List: 1)  Imitrex 100 Mg Tabs (Sumatriptan succinate) .... Take 1 tablet by mouth as directed 2)  Zyrtec Allergy 10 Mg Tabs (Cetirizine hcl) .Marland Kitchen.. 1 by mouth once daily 3)  Lunesta 3 Mg Tabs (Eszopiclone) .Marland Kitchen.. 1 hs as directed 4)  Wellbutrin Xl 150 Mg Tb24 (Bupropion hcl) .... 2 by mouth once daily 5)  Flonase 50 Mcg/act Susp (Fluticasone propionate) .... 2 sprays each nares q d 6)  Cymbalta 60 Mg Cpep (Duloxetine hcl) .Marland Kitchen.. 1 by mouth once daily 7)  Ativan  0.5 Mg Tabs (Lorazepam) .Marland Kitchen.. 1 by mouth two times a day as needed anxiety  Other Orders: Urine Pregnancy Test  ([id number])  Patient Instructions: 1)  wil notify you of lab when available will review med list if positive about risk in pregnancy.  counseled  Laboratory Results   Urine Tests  Date/Time Recieved: April 23, 2010 12:00 PM  Date/Time Reported: April 23, 2010 12:00 PM     Urine HCG: negative Comments: Wynona Canes, CMA  April 23, 2010 12:00 PM

## 2010-11-17 ENCOUNTER — Inpatient Hospital Stay (HOSPITAL_COMMUNITY): Payer: 59

## 2010-11-17 ENCOUNTER — Inpatient Hospital Stay (HOSPITAL_COMMUNITY)
Admission: AD | Admit: 2010-11-17 | Discharge: 2010-12-14 | DRG: 774 | Disposition: A | Discharge status: Home / Self Care | Payer: 59 | Source: Ambulatory Visit | Attending: Obstetrics and Gynecology | Admitting: Obstetrics and Gynecology

## 2010-11-17 DIAGNOSIS — O99814 Abnormal glucose complicating childbirth: Secondary | ICD-10-CM | POA: Diagnosis present

## 2010-11-17 DIAGNOSIS — O99344 Other mental disorders complicating childbirth: Secondary | ICD-10-CM | POA: Diagnosis present

## 2010-11-17 DIAGNOSIS — F3289 Other specified depressive episodes: Secondary | ICD-10-CM | POA: Diagnosis present

## 2010-11-17 DIAGNOSIS — O9903 Anemia complicating the puerperium: Secondary | ICD-10-CM | POA: Diagnosis not present

## 2010-11-17 DIAGNOSIS — D649 Anemia, unspecified: Secondary | ICD-10-CM | POA: Diagnosis not present

## 2010-11-17 DIAGNOSIS — F329 Major depressive disorder, single episode, unspecified: Secondary | ICD-10-CM | POA: Diagnosis present

## 2010-11-17 DIAGNOSIS — IMO0002 Reserved for concepts with insufficient information to code with codable children: Principal | ICD-10-CM | POA: Diagnosis present

## 2010-11-17 DIAGNOSIS — E669 Obesity, unspecified: Secondary | ICD-10-CM | POA: Diagnosis present

## 2010-11-17 LAB — CBC
Hemoglobin: 11 g/dL — ABNORMAL LOW (ref 12.0–15.0)
MCH: 26 pg (ref 26.0–34.0)
Platelets: 218 10*3/uL (ref 150–400)
RBC: 4.23 MIL/uL (ref 3.87–5.11)
WBC: 9 10*3/uL (ref 4.0–10.5)

## 2010-11-17 LAB — COMPREHENSIVE METABOLIC PANEL
ALT: 27 U/L (ref 0–35)
AST: 29 U/L (ref 0–37)
Albumin: 1.9 g/dL — ABNORMAL LOW (ref 3.5–5.2)
CO2: 24 mEq/L (ref 19–32)
Chloride: 105 mEq/L (ref 96–112)
Creatinine, Ser: 0.8 mg/dL (ref 0.4–1.2)
GFR calc Af Amer: >60 mL/min (ref >60)
GFR calc non Af Amer: >60 mL/min (ref >60)
Potassium: 4.1 mEq/L (ref 3.5–5.1)
Sodium: 135 mEq/L (ref 135–145)
Total Bilirubin: 0.3 mg/dL (ref 0.3–1.2)

## 2010-11-18 LAB — CBC
HCT: 33.8 % — ABNORMAL LOW (ref 36.0–46.0)
MCV: 81.6 fL (ref 78.0–100.0)
RBC: 4.14 MIL/uL (ref 3.87–5.11)
WBC: 8.7 10*3/uL (ref 4.0–10.5)

## 2010-11-18 LAB — LACTATE DEHYDROGENASE: LDH: 146 U/L (ref 94–250)

## 2010-11-18 LAB — COMPREHENSIVE METABOLIC PANEL
Alkaline Phosphatase: 67 U/L (ref 39–117)
BUN: 8 mg/dL (ref 6–23)
CO2: 24 mEq/L (ref 19–32)
Chloride: 107 mEq/L (ref 96–112)
Glucose, Bld: 88 mg/dL (ref 70–99)
Potassium: 4.4 mEq/L (ref 3.5–5.1)
Total Bilirubin: 0.3 mg/dL (ref 0.3–1.2)

## 2010-11-18 LAB — GLUCOSE, CAPILLARY

## 2010-11-18 LAB — URIC ACID: Uric Acid, Serum: 6.7 mg/dL (ref 2.4–7.0)

## 2010-11-19 LAB — COMPREHENSIVE METABOLIC PANEL
ALT: 27 U/L (ref 0–35)
Calcium: 8.5 mg/dL (ref 8.4–10.5)
Creatinine, Ser: 0.75 mg/dL (ref 0.4–1.2)
Glucose, Bld: 83 mg/dL (ref 70–99)
Sodium: 137 mEq/L (ref 135–145)
Total Protein: 4.6 g/dL — ABNORMAL LOW (ref 6.0–8.3)

## 2010-11-19 LAB — STREP B DNA PROBE

## 2010-11-19 LAB — URIC ACID: Uric Acid, Serum: 6.7 mg/dL (ref 2.4–7.0)

## 2010-11-19 LAB — LACTATE DEHYDROGENASE: LDH: 143 U/L (ref 94–250)

## 2010-11-19 LAB — GLUCOSE, CAPILLARY
Glucose-Capillary: 90 mg/dL (ref 70–99)
Glucose-Capillary: 104 mg/dL — ABNORMAL HIGH (ref 70–99)
Glucose-Capillary: 93 mg/dL (ref 70–99)

## 2010-11-19 LAB — CBC
HCT: 34.2 % — ABNORMAL LOW (ref 36.0–46.0)
MCH: 26 pg (ref 26.0–34.0)
MCHC: 31.9 g/dL (ref 30.0–36.0)
RDW: 13.2 % (ref 11.5–15.5)

## 2010-11-20 LAB — GLUCOSE, CAPILLARY
Glucose-Capillary: 114 mg/dL — ABNORMAL HIGH (ref 70–99)
Glucose-Capillary: 94 mg/dL (ref 70–99)

## 2010-11-20 LAB — CBC
HCT: 34.2 % — ABNORMAL LOW (ref 36.0–46.0)
Hemoglobin: 11 g/dL — ABNORMAL LOW (ref 12.0–15.0)
MCH: 26.1 pg (ref 26.0–34.0)
MCV: 81.2 fL (ref 78.0–100.0)
RBC: 4.21 MIL/uL (ref 3.87–5.11)

## 2010-11-20 LAB — URIC ACID: Uric Acid, Serum: 7.3 mg/dL — ABNORMAL HIGH (ref 2.4–7.0)

## 2010-11-20 LAB — COMPREHENSIVE METABOLIC PANEL
AST: 23 U/L (ref 0–37)
BUN: 9 mg/dL (ref 6–23)
CO2: 19 mEq/L (ref 19–32)
Chloride: 107 mEq/L (ref 96–112)
Creatinine, Ser: 0.71 mg/dL (ref 0.4–1.2)
GFR calc non Af Amer: >60 mL/min (ref >60)
Glucose, Bld: 92 mg/dL (ref 70–99)
Total Bilirubin: 0.4 mg/dL (ref 0.3–1.2)

## 2010-11-21 LAB — COMPREHENSIVE METABOLIC PANEL
BUN: 10 mg/dL (ref 6–23)
CO2: 22 mEq/L (ref 19–32)
Calcium: 8.8 mg/dL (ref 8.4–10.5)
Creatinine, Ser: 0.89 mg/dL (ref 0.4–1.2)
GFR calc Af Amer: >60 mL/min (ref >60)
GFR calc non Af Amer: >60 mL/min (ref >60)
Glucose, Bld: 103 mg/dL — ABNORMAL HIGH (ref 70–99)
Total Bilirubin: 0.4 mg/dL (ref 0.3–1.2)

## 2010-11-21 LAB — URIC ACID: Uric Acid, Serum: 7.2 mg/dL — ABNORMAL HIGH (ref 2.4–7.0)

## 2010-11-21 LAB — GLUCOSE, CAPILLARY: Glucose-Capillary: 96 mg/dL (ref 70–99)

## 2010-11-21 LAB — CBC
HCT: 32.5 % — ABNORMAL LOW (ref 36.0–46.0)
MCH: 26.3 pg (ref 26.0–34.0)
MCHC: 32.3 g/dL (ref 30.0–36.0)
RDW: 13.1 % (ref 11.5–15.5)

## 2010-11-21 LAB — LACTATE DEHYDROGENASE: LDH: 150 U/L (ref 94–250)

## 2010-11-22 LAB — COMPREHENSIVE METABOLIC PANEL
ALT: 21 U/L (ref 0–35)
AST: 15 U/L (ref 0–37)
Albumin: 1.7 g/dL — ABNORMAL LOW (ref 3.5–5.2)
CO2: 22 mEq/L (ref 19–32)
Calcium: 8.6 mg/dL (ref 8.4–10.5)
Chloride: 110 mEq/L (ref 96–112)
Creatinine, Ser: 0.81 mg/dL (ref 0.4–1.2)
GFR calc Af Amer: >60 mL/min (ref >60)
GFR calc non Af Amer: >60 mL/min (ref >60)
Sodium: 137 mEq/L (ref 135–145)
Total Bilirubin: 0.2 mg/dL — ABNORMAL LOW (ref 0.3–1.2)

## 2010-11-22 LAB — GLUCOSE, CAPILLARY
Glucose-Capillary: 97 mg/dL (ref 70–99)
Glucose-Capillary: 109 mg/dL — ABNORMAL HIGH (ref 70–99)
Glucose-Capillary: 97 mg/dL (ref 70–99)

## 2010-11-22 LAB — CBC
Hemoglobin: 10.7 g/dL — ABNORMAL LOW (ref 12.0–15.0)
Platelets: 205 10*3/uL (ref 150–400)
RBC: 4.12 MIL/uL (ref 3.87–5.11)

## 2010-11-23 LAB — COMPREHENSIVE METABOLIC PANEL
AST: 18 U/L (ref 0–37)
Albumin: 1.8 g/dL — ABNORMAL LOW (ref 3.5–5.2)
BUN: 7 mg/dL (ref 6–23)
Calcium: 8.6 mg/dL (ref 8.4–10.5)
Creatinine, Ser: 0.79 mg/dL (ref 0.4–1.2)
GFR calc Af Amer: >60 mL/min (ref >60)
Total Protein: 4.9 g/dL — ABNORMAL LOW (ref 6.0–8.3)

## 2010-11-23 LAB — CBC
MCH: 26.2 pg (ref 26.0–34.0)
MCV: 80.7 fL (ref 78.0–100.0)
Platelets: 209 10*3/uL (ref 150–400)
RBC: 4.2 MIL/uL (ref 3.87–5.11)
RDW: 13.3 % (ref 11.5–15.5)

## 2010-11-23 LAB — GLUCOSE, CAPILLARY
Glucose-Capillary: 89 mg/dL (ref 70–99)
Glucose-Capillary: 126 mg/dL — ABNORMAL HIGH (ref 70–99)
Glucose-Capillary: 109 mg/dL — ABNORMAL HIGH (ref 70–99)

## 2010-11-23 LAB — URIC ACID: Uric Acid, Serum: 6.4 mg/dL (ref 2.4–7.0)

## 2010-11-24 LAB — GLUCOSE, CAPILLARY
Glucose-Capillary: 135 mg/dL — ABNORMAL HIGH (ref 70–99)
Glucose-Capillary: 113 mg/dL — ABNORMAL HIGH (ref 70–99)
Glucose-Capillary: 127 mg/dL — ABNORMAL HIGH (ref 70–99)

## 2010-11-24 LAB — COMPREHENSIVE METABOLIC PANEL
ALT: 19 U/L (ref 0–35)
AST: 18 U/L (ref 0–37)
Alkaline Phosphatase: 72 U/L (ref 39–117)
CO2: 23 mEq/L (ref 19–32)
Chloride: 108 mEq/L (ref 96–112)
GFR calc Af Amer: >60 mL/min (ref >60)
GFR calc non Af Amer: >60 mL/min (ref >60)
Glucose, Bld: 91 mg/dL (ref 70–99)
Potassium: 4.3 mEq/L (ref 3.5–5.1)
Sodium: 137 mEq/L (ref 135–145)
Total Bilirubin: 0.3 mg/dL (ref 0.3–1.2)

## 2010-11-24 LAB — CBC
HCT: 32.4 % — ABNORMAL LOW (ref 36.0–46.0)
Hemoglobin: 10.5 g/dL — ABNORMAL LOW (ref 12.0–15.0)
RBC: 4.02 MIL/uL (ref 3.87–5.11)

## 2010-11-25 LAB — COMPREHENSIVE METABOLIC PANEL
Albumin: 1.7 g/dL — ABNORMAL LOW (ref 3.5–5.2)
Alkaline Phosphatase: 65 U/L (ref 39–117)
BUN: 8 mg/dL (ref 6–23)
Creatinine, Ser: 0.91 mg/dL (ref 0.4–1.2)
Potassium: 4.2 mEq/L (ref 3.5–5.1)
Total Protein: 4.7 g/dL — ABNORMAL LOW (ref 6.0–8.3)

## 2010-11-25 LAB — CREATININE CLEARANCE, URINE, 24 HOUR
Creatinine Clearance: 189 mL/min — ABNORMAL HIGH (ref 75–115)
Creatinine, 24H Ur: 1908 mg/d — ABNORMAL HIGH (ref 700–1800)
Creatinine: 0.7 mg/dL (ref 0.4–1.2)
Urine Total Volume-CRCL: 2624 mL

## 2010-11-25 LAB — PROTEIN, URINE, 24 HOUR: Urine Total Volume-UPROT: 2624 mL

## 2010-11-25 LAB — GLUCOSE, CAPILLARY
Glucose-Capillary: 96 mg/dL (ref 70–99)
Glucose-Capillary: 92 mg/dL (ref 70–99)
Glucose-Capillary: 95 mg/dL (ref 70–99)

## 2010-11-25 LAB — CBC
MCH: 25.4 pg — ABNORMAL LOW (ref 26.0–34.0)
Platelets: 207 10*3/uL (ref 150–400)
RBC: 3.97 MIL/uL (ref 3.87–5.11)

## 2010-11-25 LAB — URIC ACID: Uric Acid, Serum: 6 mg/dL (ref 2.4–7.0)

## 2010-11-26 LAB — COMPREHENSIVE METABOLIC PANEL
Albumin: 1.7 g/dL — ABNORMAL LOW (ref 3.5–5.2)
Alkaline Phosphatase: 68 U/L (ref 39–117)
BUN: 9 mg/dL (ref 6–23)
Chloride: 106 mEq/L (ref 96–112)
Glucose, Bld: 106 mg/dL — ABNORMAL HIGH (ref 70–99)
Potassium: 3.8 mEq/L (ref 3.5–5.1)
Total Bilirubin: 0.4 mg/dL (ref 0.3–1.2)

## 2010-11-26 LAB — CBC
Hemoglobin: 10.4 g/dL — ABNORMAL LOW (ref 12.0–15.0)
MCH: 25.7 pg — ABNORMAL LOW (ref 26.0–34.0)
MCV: 80.4 fL (ref 78.0–100.0)
RBC: 4.04 MIL/uL (ref 3.87–5.11)

## 2010-11-26 LAB — GLUCOSE, CAPILLARY: Glucose-Capillary: 110 mg/dL — ABNORMAL HIGH (ref 70–99)

## 2010-11-26 LAB — LACTATE DEHYDROGENASE: LDH: 137 U/L (ref 94–250)

## 2010-11-26 LAB — URIC ACID: Uric Acid, Serum: 5.9 mg/dL (ref 2.4–7.0)

## 2010-11-27 LAB — COMPREHENSIVE METABOLIC PANEL
ALT: 16 U/L (ref 0–35)
AST: 19 U/L (ref 0–37)
CO2: 23 mEq/L (ref 19–32)
Calcium: 8.6 mg/dL (ref 8.4–10.5)
GFR calc Af Amer: >60 mL/min (ref >60)
GFR calc non Af Amer: >60 mL/min (ref >60)
Potassium: 4.4 mEq/L (ref 3.5–5.1)
Sodium: 137 mEq/L (ref 135–145)

## 2010-11-27 LAB — GLUCOSE, CAPILLARY: Glucose-Capillary: 152 mg/dL — ABNORMAL HIGH (ref 70–99)

## 2010-11-27 LAB — CBC
Hemoglobin: 10.4 g/dL — ABNORMAL LOW (ref 12.0–15.0)
MCHC: 31.9 g/dL (ref 30.0–36.0)
RBC: 4.06 MIL/uL (ref 3.87–5.11)
WBC: 10.5 10*3/uL (ref 4.0–10.5)

## 2010-11-28 LAB — GLUCOSE, CAPILLARY

## 2010-11-29 LAB — URIC ACID: Uric Acid, Serum: 5.7 mg/dL (ref 2.4–7.0)

## 2010-11-29 LAB — CBC
HCT: 31.6 % — ABNORMAL LOW (ref 36.0–46.0)
MCV: 80.2 fL (ref 78.0–100.0)
RBC: 3.94 MIL/uL (ref 3.87–5.11)
WBC: 9.8 10*3/uL (ref 4.0–10.5)

## 2010-11-29 LAB — COMPREHENSIVE METABOLIC PANEL
Alkaline Phosphatase: 78 U/L (ref 39–117)
BUN: 11 mg/dL (ref 6–23)
CO2: 22 mEq/L (ref 19–32)
Chloride: 107 mEq/L (ref 96–112)
Glucose, Bld: 82 mg/dL (ref 70–99)
Potassium: 4.1 mEq/L (ref 3.5–5.1)
Total Bilirubin: 0.4 mg/dL (ref 0.3–1.2)

## 2010-11-29 LAB — GLUCOSE, CAPILLARY: Glucose-Capillary: 136 mg/dL — ABNORMAL HIGH (ref 70–99)

## 2010-11-29 LAB — LACTATE DEHYDROGENASE: LDH: 142 U/L (ref 94–250)

## 2010-11-30 LAB — GLUCOSE, CAPILLARY
Glucose-Capillary: 121 mg/dL — ABNORMAL HIGH (ref 70–99)
Glucose-Capillary: 129 mg/dL — ABNORMAL HIGH (ref 70–99)

## 2010-12-01 LAB — GLUCOSE, CAPILLARY
Glucose-Capillary: 105 mg/dL — ABNORMAL HIGH (ref 70–99)
Glucose-Capillary: 94 mg/dL (ref 70–99)

## 2010-12-01 LAB — COMPREHENSIVE METABOLIC PANEL
ALT: 15 U/L (ref 0–35)
AST: 15 U/L (ref 0–37)
Calcium: 8.6 mg/dL (ref 8.4–10.5)
GFR calc Af Amer: >60 mL/min (ref >60)
Sodium: 135 mEq/L (ref 135–145)
Total Protein: 4.5 g/dL — ABNORMAL LOW (ref 6.0–8.3)

## 2010-12-01 LAB — CBC
Hemoglobin: 10.2 g/dL — ABNORMAL LOW (ref 12.0–15.0)
MCH: 25.8 pg — ABNORMAL LOW (ref 26.0–34.0)
MCV: 80.1 fL (ref 78.0–100.0)
RBC: 3.96 MIL/uL (ref 3.87–5.11)

## 2010-12-01 LAB — PROTEIN, URINE, 24 HOUR: Collection Interval-UPROT: 24 hours

## 2010-12-01 LAB — CREATININE CLEARANCE, URINE, 24 HOUR
Creatinine, 24H Ur: 2079 mg/d — ABNORMAL HIGH (ref 700–1800)
Creatinine, Urine: 50.7 mg/dL
Creatinine: 0.78 mg/dL (ref 0.4–1.2)
Urine Total Volume-CRCL: 4100 mL

## 2010-12-01 LAB — LACTATE DEHYDROGENASE: LDH: 153 U/L (ref 94–250)

## 2010-12-02 LAB — COMPREHENSIVE METABOLIC PANEL
Albumin: 2.1 g/dL — ABNORMAL LOW (ref 3.5–5.2)
BUN: 10 mg/dL (ref 6–23)
Calcium: 8.6 mg/dL (ref 8.4–10.5)
Creatinine, Ser: 0.79 mg/dL (ref 0.4–1.2)
Glucose, Bld: 100 mg/dL — ABNORMAL HIGH (ref 70–99)
Total Protein: 5.5 g/dL — ABNORMAL LOW (ref 6.0–8.3)

## 2010-12-02 LAB — GLUCOSE, CAPILLARY
Glucose-Capillary: 90 mg/dL (ref 70–99)
Glucose-Capillary: 129 mg/dL — ABNORMAL HIGH (ref 70–99)

## 2010-12-02 LAB — CBC
MCH: 25.6 pg — ABNORMAL LOW (ref 26.0–34.0)
MCHC: 32.1 g/dL (ref 30.0–36.0)
MCV: 79.9 fL (ref 78.0–100.0)
Platelets: 264 10*3/uL (ref 150–400)
RDW: 13.5 % (ref 11.5–15.5)
WBC: 9.1 10*3/uL (ref 4.0–10.5)

## 2010-12-02 LAB — URIC ACID: Uric Acid, Serum: 6 mg/dL (ref 2.4–7.0)

## 2010-12-02 LAB — LACTATE DEHYDROGENASE: LDH: 164 U/L (ref 94–250)

## 2010-12-03 LAB — CBC
HCT: 32.5 % — ABNORMAL LOW (ref 36.0–46.0)
Hemoglobin: 10.3 g/dL — ABNORMAL LOW (ref 12.0–15.0)
MCH: 25.2 pg — ABNORMAL LOW (ref 26.0–34.0)
MCHC: 31.7 g/dL (ref 30.0–36.0)
RBC: 4.08 MIL/uL (ref 3.87–5.11)

## 2010-12-03 LAB — COMPREHENSIVE METABOLIC PANEL
ALT: 15 U/L (ref 0–35)
AST: 16 U/L (ref 0–37)
CO2: 23 mEq/L (ref 19–32)
Calcium: 8.9 mg/dL (ref 8.4–10.5)
Chloride: 108 mEq/L (ref 96–112)
Creatinine, Ser: 0.84 mg/dL (ref 0.4–1.2)
GFR calc Af Amer: >60 mL/min (ref >60)
GFR calc non Af Amer: >60 mL/min (ref >60)
Glucose, Bld: 89 mg/dL (ref 70–99)
Sodium: 138 mEq/L (ref 135–145)
Total Bilirubin: 0.4 mg/dL (ref 0.3–1.2)

## 2010-12-03 LAB — LACTATE DEHYDROGENASE: LDH: 144 U/L (ref 94–250)

## 2010-12-03 LAB — GLUCOSE, CAPILLARY: Glucose-Capillary: 125 mg/dL — ABNORMAL HIGH (ref 70–99)

## 2010-12-04 LAB — CBC
HCT: 32.9 % — ABNORMAL LOW (ref 36.0–46.0)
Hemoglobin: 10.6 g/dL — ABNORMAL LOW (ref 12.0–15.0)
MCH: 25.6 pg — ABNORMAL LOW (ref 26.0–34.0)
MCHC: 32.2 g/dL (ref 30.0–36.0)

## 2010-12-04 LAB — COMPREHENSIVE METABOLIC PANEL
ALT: 14 U/L (ref 0–35)
CO2: 22 mEq/L (ref 19–32)
Calcium: 8.7 mg/dL (ref 8.4–10.5)
GFR calc non Af Amer: >60 mL/min (ref >60)
Glucose, Bld: 90 mg/dL (ref 70–99)
Sodium: 136 mEq/L (ref 135–145)

## 2010-12-04 LAB — GLUCOSE, CAPILLARY
Glucose-Capillary: 95 mg/dL (ref 70–99)
Glucose-Capillary: 79 mg/dL (ref 70–99)

## 2010-12-04 LAB — LACTATE DEHYDROGENASE: LDH: 149 U/L (ref 94–250)

## 2010-12-05 LAB — COMPREHENSIVE METABOLIC PANEL
BUN: 14 mg/dL (ref 6–23)
Calcium: 9 mg/dL (ref 8.4–10.5)
Creatinine, Ser: 0.77 mg/dL (ref 0.4–1.2)
Glucose, Bld: 93 mg/dL (ref 70–99)
Total Protein: 4.7 g/dL — ABNORMAL LOW (ref 6.0–8.3)

## 2010-12-05 LAB — CBC
HCT: 32.6 % — ABNORMAL LOW (ref 36.0–46.0)
MCH: 25.4 pg — ABNORMAL LOW (ref 26.0–34.0)
MCHC: 31.9 g/dL (ref 30.0–36.0)
MCV: 79.7 fL (ref 78.0–100.0)
RDW: 13.6 % (ref 11.5–15.5)

## 2010-12-05 LAB — GLUCOSE, CAPILLARY
Glucose-Capillary: 108 mg/dL — ABNORMAL HIGH (ref 70–99)
Glucose-Capillary: 97 mg/dL (ref 70–99)

## 2010-12-05 LAB — LACTATE DEHYDROGENASE: LDH: 160 U/L (ref 94–250)

## 2010-12-05 LAB — URIC ACID: Uric Acid, Serum: 5.7 mg/dL (ref 2.4–7.0)

## 2010-12-06 LAB — COMPREHENSIVE METABOLIC PANEL
AST: 17 U/L (ref 0–37)
Albumin: 1.9 g/dL — ABNORMAL LOW (ref 3.5–5.2)
BUN: 9 mg/dL (ref 6–23)
CO2: 22 mEq/L (ref 19–32)
Calcium: 8.5 mg/dL (ref 8.4–10.5)
Creatinine, Ser: 0.71 mg/dL (ref 0.4–1.2)
GFR calc Af Amer: >60 mL/min (ref >60)
GFR calc non Af Amer: >60 mL/min (ref >60)

## 2010-12-06 LAB — URIC ACID: Uric Acid, Serum: 5.8 mg/dL (ref 2.4–7.0)

## 2010-12-06 LAB — GLUCOSE, CAPILLARY
Glucose-Capillary: 76 mg/dL (ref 70–99)
Glucose-Capillary: 106 mg/dL — ABNORMAL HIGH (ref 70–99)

## 2010-12-06 LAB — CBC
MCH: 25.1 pg — ABNORMAL LOW (ref 26.0–34.0)
MCHC: 31.7 g/dL (ref 30.0–36.0)
MCV: 78.9 fL (ref 78.0–100.0)
Platelets: 252 10*3/uL (ref 150–400)

## 2010-12-07 LAB — CBC
MCHC: 31.8 g/dL (ref 30.0–36.0)
MCV: 78.9 fL (ref 78.0–100.0)
Platelets: 244 10*3/uL (ref 150–400)
RDW: 13.7 % (ref 11.5–15.5)
WBC: 9.7 10*3/uL (ref 4.0–10.5)

## 2010-12-07 LAB — URIC ACID: Uric Acid, Serum: 5.7 mg/dL (ref 2.4–7.0)

## 2010-12-07 LAB — GLUCOSE, CAPILLARY
Glucose-Capillary: 80 mg/dL (ref 70–99)
Glucose-Capillary: 110 mg/dL — ABNORMAL HIGH (ref 70–99)

## 2010-12-07 LAB — COMPREHENSIVE METABOLIC PANEL
BUN: 11 mg/dL (ref 6–23)
CO2: 23 mEq/L (ref 19–32)
Calcium: 8.5 mg/dL (ref 8.4–10.5)
Creatinine, Ser: 0.68 mg/dL (ref 0.4–1.2)
GFR calc Af Amer: >60 mL/min (ref >60)
GFR calc non Af Amer: >60 mL/min (ref >60)
Glucose, Bld: 79 mg/dL (ref 70–99)

## 2010-12-07 LAB — LACTATE DEHYDROGENASE: LDH: 144 U/L (ref 94–250)

## 2010-12-08 LAB — PROTEIN, URINE, 24 HOUR
Protein, 24H Urine: 2773 mg/d — ABNORMAL HIGH (ref 50–100)
Protein, Urine: 94 mg/dL
Urine Total Volume-UPROT: 2950 mL

## 2010-12-08 LAB — COMPREHENSIVE METABOLIC PANEL
AST: 16 U/L (ref 0–37)
Albumin: 2 g/dL — ABNORMAL LOW (ref 3.5–5.2)
BUN: 13 mg/dL (ref 6–23)
Calcium: 8.8 mg/dL (ref 8.4–10.5)
Creatinine, Ser: 0.78 mg/dL (ref 0.4–1.2)
GFR calc Af Amer: >60 mL/min (ref >60)
GFR calc non Af Amer: >60 mL/min (ref >60)

## 2010-12-08 LAB — GLUCOSE, CAPILLARY
Glucose-Capillary: 91 mg/dL (ref 70–99)
Glucose-Capillary: 119 mg/dL — ABNORMAL HIGH (ref 70–99)
Glucose-Capillary: 73 mg/dL (ref 70–99)

## 2010-12-08 LAB — CBC
MCH: 24.9 pg — ABNORMAL LOW (ref 26.0–34.0)
MCHC: 31.5 g/dL (ref 30.0–36.0)
MCV: 79.3 fL (ref 78.0–100.0)
Platelets: 257 10*3/uL (ref 150–400)

## 2010-12-08 LAB — URIC ACID: Uric Acid, Serum: 5.7 mg/dL (ref 2.4–7.0)

## 2010-12-08 LAB — CREATININE CLEARANCE, URINE, 24 HOUR
Creatinine: 0.78 mg/dL (ref 0.4–1.2)
Urine Total Volume-CRCL: 2950 mL

## 2010-12-09 LAB — CBC
HCT: 36.7 % (ref 36.0–46.0)
Hemoglobin: 11.8 g/dL — ABNORMAL LOW (ref 12.0–15.0)
MCHC: 30.7 g/dL (ref 30.0–36.0)
MCV: 78.9 fL (ref 78.0–100.0)
Platelets: 287 10*3/uL (ref 150–400)
Platelets: 268 10*3/uL (ref 150–400)
RBC: 4.65 MIL/uL (ref 3.87–5.11)
RDW: 13.8 % (ref 11.5–15.5)
WBC: 11.8 10*3/uL — ABNORMAL HIGH (ref 4.0–10.5)
WBC: 12.2 10*3/uL — ABNORMAL HIGH (ref 4.0–10.5)

## 2010-12-09 LAB — COMPREHENSIVE METABOLIC PANEL
ALT: 18 U/L (ref 0–35)
Albumin: 2.2 g/dL — ABNORMAL LOW (ref 3.5–5.2)
Albumin: 2.3 g/dL — ABNORMAL LOW (ref 3.5–5.2)
Alkaline Phosphatase: 97 U/L (ref 39–117)
Alkaline Phosphatase: 107 U/L (ref 39–117)
BUN: 13 mg/dL (ref 6–23)
CO2: 22 mEq/L (ref 19–32)
Chloride: 104 mEq/L (ref 96–112)
Chloride: 104 mEq/L (ref 96–112)
Creatinine, Ser: 0.83 mg/dL (ref 0.4–1.2)
GFR calc non Af Amer: >60 mL/min (ref >60)
Glucose, Bld: 86 mg/dL (ref 70–99)
Glucose, Bld: 87 mg/dL (ref 70–99)
Potassium: 3.9 mEq/L (ref 3.5–5.1)
Potassium: 4.3 mEq/L (ref 3.5–5.1)
Sodium: 134 mEq/L — ABNORMAL LOW (ref 135–145)
Total Bilirubin: 0.1 mg/dL — ABNORMAL LOW (ref 0.3–1.2)
Total Bilirubin: 0.4 mg/dL (ref 0.3–1.2)
Total Protein: 5.9 g/dL — ABNORMAL LOW (ref 6.0–8.3)

## 2010-12-09 LAB — GLUCOSE, CAPILLARY
Glucose-Capillary: 106 mg/dL — ABNORMAL HIGH (ref 70–99)
Glucose-Capillary: 115 mg/dL — ABNORMAL HIGH (ref 70–99)
Glucose-Capillary: 83 mg/dL (ref 70–99)
Glucose-Capillary: 81 mg/dL (ref 70–99)

## 2010-12-09 LAB — LACTATE DEHYDROGENASE
LDH: 170 U/L (ref 94–250)
LDH: 174 U/L (ref 94–250)

## 2010-12-09 LAB — URIC ACID: Uric Acid, Serum: 5.5 mg/dL (ref 2.4–7.0)

## 2010-12-10 LAB — CBC
Platelets: 272 10*3/uL (ref 150–400)
RDW: 13.9 % (ref 11.5–15.5)
WBC: 10.4 10*3/uL (ref 4.0–10.5)

## 2010-12-10 LAB — COMPREHENSIVE METABOLIC PANEL
ALT: 15 U/L (ref 0–35)
AST: 19 U/L (ref 0–37)
Albumin: 2.2 g/dL — ABNORMAL LOW (ref 3.5–5.2)
Alkaline Phosphatase: 102 U/L (ref 39–117)
Potassium: 4.2 mEq/L (ref 3.5–5.1)
Sodium: 129 mEq/L — ABNORMAL LOW (ref 135–145)
Total Protein: 5.1 g/dL — ABNORMAL LOW (ref 6.0–8.3)

## 2010-12-10 LAB — GLUCOSE, CAPILLARY
Glucose-Capillary: 115 mg/dL — ABNORMAL HIGH (ref 70–99)
Glucose-Capillary: 116 mg/dL — ABNORMAL HIGH (ref 70–99)

## 2010-12-11 LAB — COMPREHENSIVE METABOLIC PANEL
ALT: 17 U/L (ref 0–35)
Alkaline Phosphatase: 107 U/L (ref 39–117)
CO2: 22 mEq/L (ref 19–32)
Glucose, Bld: 107 mg/dL — ABNORMAL HIGH (ref 70–99)
Potassium: 4 mEq/L (ref 3.5–5.1)
Sodium: 134 mEq/L — ABNORMAL LOW (ref 135–145)
Total Protein: 5.9 g/dL — ABNORMAL LOW (ref 6.0–8.3)

## 2010-12-11 LAB — CBC
HCT: 35.2 % — ABNORMAL LOW (ref 36.0–46.0)
MCH: 25.2 pg — ABNORMAL LOW (ref 26.0–34.0)
MCV: 78.2 fL (ref 78.0–100.0)
MCV: 77.6 fL — ABNORMAL LOW (ref 78.0–100.0)
Platelets: 310 10*3/uL (ref 150–400)
Platelets: 269 10*3/uL (ref 150–400)
RBC: 4.5 MIL/uL (ref 3.87–5.11)
RBC: 4.25 MIL/uL (ref 3.87–5.11)
WBC: 9.3 10*3/uL (ref 4.0–10.5)

## 2010-12-11 LAB — LACTATE DEHYDROGENASE: LDH: 180 U/L (ref 94–250)

## 2010-12-12 ENCOUNTER — Other Ambulatory Visit: Payer: Self-pay | Admitting: Obstetrics and Gynecology

## 2010-12-12 LAB — CBC
HCT: 29.2 % — ABNORMAL LOW (ref 36.0–46.0)
MCH: 24.5 pg — ABNORMAL LOW (ref 26.0–34.0)
MCHC: 31.5 g/dL (ref 30.0–36.0)
MCV: 77.7 fL — ABNORMAL LOW (ref 78.0–100.0)
MCV: 78.1 fL (ref 78.0–100.0)
Platelets: 241 10*3/uL (ref 150–400)
RDW: 13.8 % (ref 11.5–15.5)
RDW: 13.8 % (ref 11.5–15.5)
WBC: 16.9 10*3/uL — ABNORMAL HIGH (ref 4.0–10.5)

## 2010-12-12 LAB — COMPREHENSIVE METABOLIC PANEL
Albumin: 1.5 g/dL — ABNORMAL LOW (ref 3.5–5.2)
Alkaline Phosphatase: 84 U/L (ref 39–117)
BUN: 9 mg/dL (ref 6–23)
Calcium: 7.2 mg/dL — ABNORMAL LOW (ref 8.4–10.5)
Creatinine, Ser: 1.23 mg/dL — ABNORMAL HIGH (ref 0.4–1.2)
Potassium: 4 mEq/L (ref 3.5–5.1)
Total Protein: 3.7 g/dL — ABNORMAL LOW (ref 6.0–8.3)

## 2010-12-12 LAB — GLUCOSE, CAPILLARY: Glucose-Capillary: 71 mg/dL (ref 70–99)

## 2010-12-12 LAB — RPR: RPR Ser Ql: NONREACTIVE

## 2010-12-12 LAB — MRSA PCR SCREENING: MRSA by PCR: NEGATIVE

## 2010-12-13 LAB — CBC
Hemoglobin: 7 g/dL — ABNORMAL LOW (ref 12.0–15.0)
RBC: 2.76 MIL/uL — ABNORMAL LOW (ref 3.87–5.11)
WBC: 15.9 10*3/uL — ABNORMAL HIGH (ref 4.0–10.5)

## 2010-12-13 LAB — COMPREHENSIVE METABOLIC PANEL
ALT: 20 U/L (ref 0–35)
AST: 27 U/L (ref 0–37)
Albumin: 1.6 g/dL — ABNORMAL LOW (ref 3.5–5.2)
CO2: 24 mEq/L (ref 19–32)
Chloride: 108 mEq/L (ref 96–112)
GFR calc Af Amer: >60 mL/min (ref >60)
GFR calc non Af Amer: >60 mL/min (ref >60)
Potassium: 4 mEq/L (ref 3.5–5.1)
Sodium: 138 mEq/L (ref 135–145)
Total Bilirubin: 0.1 mg/dL — ABNORMAL LOW (ref 0.3–1.2)

## 2010-12-14 LAB — CBC
HCT: 22.3 % — ABNORMAL LOW (ref 36.0–46.0)
Hemoglobin: 7.1 g/dL — ABNORMAL LOW (ref 12.0–15.0)
MCH: 25.4 pg — ABNORMAL LOW (ref 26.0–34.0)
MCHC: 31.8 g/dL (ref 30.0–36.0)
RBC: 2.8 MIL/uL — ABNORMAL LOW (ref 3.87–5.11)

## 2010-12-14 LAB — BASIC METABOLIC PANEL
BUN: 10 mg/dL (ref 6–23)
CO2: 26 mEq/L (ref 19–32)
Chloride: 104 mEq/L (ref 96–112)
Creatinine, Ser: 0.97 mg/dL (ref 0.4–1.2)
GFR calc Af Amer: >60 mL/min (ref >60)
Potassium: 4.1 mEq/L (ref 3.5–5.1)

## 2010-12-15 ENCOUNTER — Encounter: Payer: Self-pay | Admitting: Internal Medicine

## 2010-12-16 ENCOUNTER — Ambulatory Visit (INDEPENDENT_AMBULATORY_CARE_PROVIDER_SITE_OTHER): Payer: 59 | Admitting: Internal Medicine

## 2010-12-16 ENCOUNTER — Encounter: Payer: Self-pay | Admitting: Internal Medicine

## 2010-12-16 DIAGNOSIS — O139 Gestational [pregnancy-induced] hypertension without significant proteinuria, unspecified trimester: Secondary | ICD-10-CM

## 2010-12-16 DIAGNOSIS — F4321 Adjustment disorder with depressed mood: Secondary | ICD-10-CM

## 2010-12-16 DIAGNOSIS — O99810 Abnormal glucose complicating pregnancy: Secondary | ICD-10-CM

## 2010-12-16 DIAGNOSIS — IMO0002 Reserved for concepts with insufficient information to code with codable children: Secondary | ICD-10-CM

## 2010-12-16 DIAGNOSIS — O24419 Gestational diabetes mellitus in pregnancy, unspecified control: Secondary | ICD-10-CM

## 2010-12-16 LAB — GLUCOSE, CAPILLARY: Glucose-Capillary: 111 mg/dL — ABNORMAL HIGH (ref 70–99)

## 2010-12-16 NOTE — Patient Instructions (Signed)
120/80 left  124/78 right  large cuff sitting

## 2010-12-17 ENCOUNTER — Ambulatory Visit (HOSPITAL_COMMUNITY)
Admission: RE | Admit: 2010-12-17 | Discharge: 2010-12-17 | Disposition: A | Discharge status: Home / Self Care | Payer: 59 | Source: Ambulatory Visit | Attending: Obstetrics and Gynecology | Admitting: Obstetrics and Gynecology

## 2010-12-17 DIAGNOSIS — O923 Agalactia: Secondary | ICD-10-CM | POA: Insufficient documentation

## 2010-12-19 ENCOUNTER — Encounter: Payer: Self-pay | Admitting: Internal Medicine

## 2010-12-19 DIAGNOSIS — O24419 Gestational diabetes mellitus in pregnancy, unspecified control: Secondary | ICD-10-CM | POA: Insufficient documentation

## 2010-12-19 DIAGNOSIS — O139 Gestational [pregnancy-induced] hypertension without significant proteinuria, unspecified trimester: Secondary | ICD-10-CM | POA: Insufficient documentation

## 2010-12-19 NOTE — Progress Notes (Signed)
  Subjective:    Patient ID: Sandra Hudson, female    DOB: 1976-06-11, 35 y.o.   MRN: [id number]  HPI the patient comes in today with new born daughter and husband  for blood pressure evaluation. She had 37.[redacted]  Week gestation  That was complicated by late gestational diabetes rx with glyburide and PIH  And ? Preeclampsia. rx with labetolol for one day and Magnesium . She had sig edema but has lost weight since her vaginal delivery of a healthy female .  3665 gram infant . She is tired but no CP sob  Or bleeding. SHe is attempting to breast feed but some problems with latching on.   Is on diuretic recently for leg edema  Review of Systems No fever cp sob uti sx or unusual bleeding.   Monitoring BP .  Mood  Still on her  Depression meds  cymbalta and wellbutrin    Objective:   Physical Exam Wd Wn in nad  Non toxic  HEENT appear normal  Neck no mass or jvd Chest:  Clear to A&P without wheezes rales or rhonchi CV:  S1-S2 no gallops or murmurs peripheral perfusion is normal  120/80  124/78 today right and left large cuff Ext: 1-2+ edema of legs   No redness or warmth. Psych:   oriented nl cognition   Slightly tearful when talks of dads death.   But appropriate for situation.   Neuro Non focal   Assessment & Plan:  PIH   Better     EDEMA  Some post delivery  Hx gest dm Mood:   Stable

## 2010-12-23 ENCOUNTER — Ambulatory Visit (HOSPITAL_COMMUNITY)
Admission: RE | Admit: 2010-12-23 | Discharge: 2010-12-23 | Disposition: A | Discharge status: Home / Self Care | Payer: 59 | Source: Ambulatory Visit | Attending: Obstetrics and Gynecology | Admitting: Obstetrics and Gynecology

## 2010-12-23 DIAGNOSIS — O9279 Other disorders of lactation: Secondary | ICD-10-CM | POA: Insufficient documentation

## 2010-12-30 ENCOUNTER — Ambulatory Visit (HOSPITAL_COMMUNITY)
Admission: RE | Admit: 2010-12-30 | Discharge: 2010-12-30 | Disposition: A | Discharge status: Home / Self Care | Payer: 59 | Source: Ambulatory Visit | Attending: Obstetrics and Gynecology | Admitting: Obstetrics and Gynecology

## 2010-12-30 DIAGNOSIS — O923 Agalactia: Secondary | ICD-10-CM | POA: Insufficient documentation

## 2011-01-07 ENCOUNTER — Ambulatory Visit (HOSPITAL_COMMUNITY)
Admission: RE | Admit: 2011-01-07 | Discharge: 2011-01-07 | Disposition: A | Discharge status: Home / Self Care | Payer: 59 | Source: Ambulatory Visit | Attending: Obstetrics and Gynecology | Admitting: Obstetrics and Gynecology

## 2011-01-07 DIAGNOSIS — O923 Agalactia: Secondary | ICD-10-CM | POA: Insufficient documentation

## 2011-02-15 ENCOUNTER — Inpatient Hospital Stay (HOSPITAL_COMMUNITY): Admission: AD | Admit: 2011-02-15 | Payer: Self-pay | Source: Home / Self Care | Admitting: Obstetrics and Gynecology

## 2011-02-24 NOTE — Discharge Summary (Signed)
Sandra Hudson, DELAHOZ                  ACCOUNT NO.:  [id number]  MEDICAL RECORD NO.:  [id number]           PATIENT TYPE:  I  LOCATION:  9116                          FACILITY:  [facility]  PHYSICIAN:  Jun Rightmyer A. Mylz Yuan, M.D. DATE OF BIRTH:  1976-05-30  DATE OF ADMISSION:  11/17/2010 DATE OF DISCHARGE:  12/14/2010                              DISCHARGE SUMMARY   CONSULTS: 1. Maternal Fetal Medicine. 2. Nutrition. 3. Childbirth educator. 4. Spiritual care.  HOSPITAL PROCEDURES: 1. Glucose stabilizer in labor. 2. Epidural anesthesia in labor. 3. Magnesium sulfate in labor and postpartum with postpartum AICU     admission.  ADMITTING DIAGNOSES: 1. Intrauterine pregnancy at 33-6/7th weeks. 2. Mild preeclampsia with proteinuria. 3. Gestational diabetes, on glyburide 5 mg p.o. daily. 4. Depression. 5. Obesity.  DISCHARGE DIAGNOSES: 1. Stable postpartum day #2, status post spontaneous vaginal delivery     with partial third-degree tear. 2. Preeclampsia. 3. Gestational diabetes - no meds postpartum. 4. Lactating. 5. Persisting postpartum anemia. 6. Stable depression, on Wellbutrin and Cymbalta.  HOSPITAL COURSE:  The patient is a 35 year old married white female gravida 1, para 0 who presented on the date admission at 33-6/7th weeks per an [facility] of December 30, 2010, for further monitoring secondary to an elevated 24-hour urine with total protein equal to 552 mg.  She denied contractions, no leakage of fluid, no vaginal bleeding, good fetal movement.  Denied headache, nausea, vomiting.  Had some questionable epigastric pain, but not continuous.  No other GI symptoms.  Had some sinus and head congestion with mild cough.  She had been followed by [facility] OB[facility] CNM Service until diagnosed with gestational diabetes and was transferred to the MD Service.  Her pregnancy had been remarkable for 1. Infertility. 2. Migraines. 3. Depression. 4. Obesity. 5. Gestational diabetes. 6. Mild  preeclampsia.  At time of admission, blood pressure was stable 124/74.  She had been on labetalol 200 mg p.o. daily.  Her physical exam was within normal limits with the exception of 2+ bilateral lower extremity edema.  Reflexes were normal.  No clonus, was mildly anxious on arrival.  Cervix was closed, thick and high, ballotable.  Fetal heart tracing with baseline of 130s, reactive, moderate variability, no decelerations and having irregular contractions that lasted approximately 20-30 seconds, which were mild and the patient did not discern.  She was admitted to the antenatal unit with Dr. Silverio Lay as attending physician for further consultation with Maternal Fetal Medicine. Labetalol was discontinued at time of admission.   She was to continue her glyburide 5 mg p.o. daily as well as her Wellbutrin and Cymbalta, otherwise routine antenatal orders; to receive PIH labs daily. GBS was collected on that day, which was November 17, 2010 and was negative.  The patient initially was on continuous monitoring and shortly after admission, monitoring was discontinued at night in observance of sleep and on November 29, 2010, external monitoring continued  with NST's q. shift.  The patient did receive daily PIH labs except for November 28, 2010 and November 30, 2010.  White blood cell count while she was hospitalized ranged  from 8.7-16.9 and the 16.9 was postpartum. Hemoglobin ranged from 7, which was on postpartum day #1 to 11.8 at its high.  Platelets were stable throughout her stay ranging 191-310. Metabolic panels were stable with a few days where she had low sodium with a low of 129 on December 10, 2010, but otherwise metabolic panels were completely normal.  Uric acid ranged while hospitalized from 5.4- 7.8, it was 7.8 on December 13, 2010.  She had three 24-hour urines that were collected while she was hospitalized; first on November 25, 2010, 24-hour protein was 630.  On December 01, 2010, had increased to 1230 mg of protein and on December 08, 2010, it was again elevated to 2773.  As I mentioned group beta strep was negative per culture on November 17, 2010, she had a PCR MRSA screen done that was negative after admission to [facility].  The patient did have CBGs checked daily, fasting and 2-hour postprandials.  She was well controlled on her p.o. glyburide initially 5 mg p.o. daily, it was increased to 10 mg p.o. daily on November 26, 2010, and on that regimen, she had occasional fasting that was elevated and an occasional postpartum outlier that was elevated above 120, but overall was well controlled on the glyburide. Throughout the patient's antenatal stay, she did not complain of any PIH signs or symptoms with the exception of a migraine-type headache that she complained on December 02, 2010.  It was treated with p.o. Vicodin and did resolve completely by the following day.  Consult was again made with Dr. Sherrie George, at the time of that headache complaint, to ask if continued monitoring was appropriate secondary to the headache and Dr. Sherrie George did recommend continued observance.  Plan was made after herarrival secondary to her stable PIH labs as well as lack of other PIH signs or symptoms to induce labor for subsequent delivery at 37 weeks or if signs or symptom onset occurred, or if 24-hour urine which was done weekly did show greater than or equal to 5 grams of protein.  The patient did remain stable with her mood on her Cymbalta and her Wellbutrin with the exception of having rough weekend about 2-3 weeks into the hospitalization.  Her mother is a Product manager midwife in [place] and did verbalize that she wished her mom could be with her. Her husband did come frequently and did have one of her female best friends that stayed with her over one of the weekends.  Induction was planned to begin on December 08, 2010 with Cytotec over the night and to begin Pitocin on  December 09, 2010 and to begin the magnesium when in active labor at time of Pitocin.  Just to further elaborate, the patient's blood pressures were very stable on bedrest on antenatal and the patient did not receive any antihypertensive medications antenatally.  Cervical exam on December 03, 2010 was closed cervix, long, posterior.  Following first night of induction, Pitocin had been started morning of December 09, 2010. At 8:30 in the morning, Dr. Pennie Rushing did assess the patient, she was afebrile, her blood pressures were 130s/80s. CST was negative.  Cervix was still long and closed from 4:30 a.m. check.  PIH labs remained stable and as I mentioned Pitocin was started that morning.  By lunchtime on December 09, 2010, contractions were approximately every 23 minutes, blood pressure was stable at 130/74 and that was following magnesium bolus.  Contraction pain was 4/10.  No other complaints.  Pitocin was continued.  Cervix at 1500 on December 09, 2010 was fingertip, long vertex.  Reactive fetal heart tracing with an occasional variable.  At 1940 on the evening of December 09, 2010, cervix was still unchanged and Cervidil was recommended per Dr. Pennie Rushing. The patient was hungry and had felt weak, so Pitocin was discontinued and the patient allowed to eat and to proceed with Cervidil ripening overnight.  Pitocin was restarted on the morning of December 10, 2010. Did complain of headache similar to the migraine she had had the previous week.  On the morning of December 10, 2010, blood pressure was 141/93.  Was prescribed Fioricet that morning.  Fetal heart tracing was category 1, not complaining of other pain from contractions and Pitocin was continued on the day of December 10, 2010.  At 7:22 p.m. on the evening of December 10, 2010, the patient still not feeling contractions.  Cervix was 1-2 cm, 50% effaced, -2 to -3 station. Options were again reviewed including continuation of serial  induction versus C-section, and the patient and family did desire to proceed with continued induction.  Pitocin was discontinued again on the evening of December 10, 2010 to repeat Cervidil again the evening and night of December 10, 2010.  On the morning of December 11, 2010, the patient's blood pressure was 148/91, 149/96, magnesium had continued, uric acid had continued to slightly elevate with 7.7 on that morning, had been 7.1 the day before.  Blood sugars were stable.  Headaches had improved.  Cervix was still unchanged 1-2/50/-3.  Dr. Stefano Gaul did perform artificial rupture of membranes for clear fluid and an IUPC was placed at that time, it was around 9:10 a.m.  Category 1 fetal heart tracing was noted and Pitocin continued.  Around 6 p.m. on December 11, 2010, cervix was 3 cm, 0 station and the patient was desiring epidural and was subsequently placed.  Blood sugars were stable.  Blood pressure was 149/68.  Around 10:30 p.m., cervix was 9.5, +1 station, vertex, category 1 fetal heart tracing and expectant management continued.  Complete cervical dilatation was noted at 2307 p.m. and following onset of pushing, the patient did push well to a spontaneous vaginal delivery of a viable female infant born on December 12, 2010 at 0012 a.m. at 37 weeks and 4 days with birthweight of 8 pounds and 1 ounce (3665 grams).  OA presentation was noted, clear fluid had been noted throughout her labor status post artificial rupture of membranes.  Apgars were 5 at 1 minute and 9 at 5 minutes.  Cord gases: venous pH was 7.3, arterial pH was 7.17.  Three-vessel cord was noted and placenta was sent to pathology. The patient had a partial third-degree laceration which was repaired by Dr. Leonard Schwartz.  The patient tolerated well under epidural anesthesia and 1% local lidocaine.  EBL was approximately 400 mL.  Final path report noted a placenta weighing 876 grams with infarcts with  three- vessel umbilical cord.  Following delivery, the patient was transferred to [facility] for continued postpartum magnesium infusion.  On morning rounds on December 12, 2010 which was postpartum day #0, she denied any headaches, blurred vision or right upper quadrant pain.  Blood pressures were stable, ranging 123-143 systolic, diastolics were 70-88.  Physical exam was within normal limits.  Did have 1+ edema, lower extremities. Reflexes were within normal limits with no clonus.  Hemoglobin was down to 8.2.  The patient did have orthostatics that were within normal limits.  She did have a  social work consult.  On the afternoon of December 12, 2010. the patient had been unable to void, no headache, I and O cath was performed.  Physical exam was otherwise within normal limits and plan was made to continue magnesium sulfate for 24 hours.  By postpartum day #1, which was December 13, 2010, the patient was ambulating, doing better, blood pressures were systolics of 130s- 140s/80s.  Lungs were clear.  Abdomen soft.  Fundus was firm. Hemoglobin continued to decrease to a low of 7.  Negative I and O balance greater than 700 mL.  Weight was stable.  Plan was made at that time by Dr. Pennie Rushing to start hydrochlorothiazide.  She was also given 40 mg of IV Lasix at that time and was transferred to [facility] on December 13, 2010.  Her magnesium was discontinued around 1 a.m. on December 13, 2010 as well.  On December 14, 2010, which was postpartum day #2, the patient was without complaints.  No headache or blurred vision or right upper quadrant pain.  She was afebrile.  Blood pressures ranged 119-134/73-87.  Blood sugars were stable 107, 104 and 111, on no p.o. glyburide.  Fundus was firm below umbilicus.  Extremities; DTRs were 2+ bilaterally with 2+ bilateral lower extremity edema, and no clonus.  The patient was deemed to have received full benefit of her hospital stay and was discharged home  per Dr. Jaymes Graff on postpartum day #2 .  Discharge instructions were per [facility] pamphlet. Warning signs and symptoms to report were reviewed with the patient and family.  DISCHARGE FOLLOWUP:  She was to follow up at [facility] in approximately 6 weeks or p.r.n.  She was also to have a [employer] blood pressure check on December 17, 2010.  DISCHARGE MEDICATIONS: 1. Hydrochlorothiazide 25 mg one tablet p.o. daily x14 days. 2. Claritin 10 mg p.o. daily. 3. Cymbalta 60 mg p.o. daily. 4. Prenatal vitamin one tablet p.o. daily. 5. Flonase two sprays per nostril daily. 6. Wellbutrin XL 300 mg p.o. daily. 7. Motrin 600 mg p.o. q.6 h. p.r.n. pain.  OTHER DISCHARGE INSTRUCTIONS:  She was given an iron-rich food sheet as well as encouraged to pick up Floridex to take one-two teaspoons p.o. b.i.d.  She was also given discharge pamphlet on postpartum depression signs and symptoms and resources, as well as thorough review of PIH signs and symptoms of concern to report as well.     Candice Duryea, CNM   ______________________________ Pierre Bali Normand Sloop, M.D.    CHS/MEDQ  D:  02/17/2011  T:  02/17/2011  Job:  [id number]  Electronically Signed by Carolanne Grumbling CNM on 02/23/2011 03:37:41 PM Electronically Signed by Jaymes Graff M.D. on 02/24/2011 11:15:50 PM

## 2011-03-30 ENCOUNTER — Other Ambulatory Visit: Payer: Self-pay | Admitting: Internal Medicine

## 2011-05-28 ENCOUNTER — Other Ambulatory Visit: Payer: Self-pay | Admitting: Internal Medicine

## 2011-05-30 ENCOUNTER — Telehealth: Payer: Self-pay | Admitting: Internal Medicine

## 2011-05-30 MED ORDER — BUPROPION HCL ER (XL) 150 MG PO TB24: ORAL_TABLET | ORAL | Status: DC

## 2011-05-30 NOTE — Telephone Encounter (Signed)
rx resent to pharmacy

## 2011-05-30 NOTE — Telephone Encounter (Signed)
Pharmacy needs clarification on  Prescription for buPROPion (WELLBUTRIN XL) 150 MG  Prescription was called in for Quantity 60 take 1 time daily and the prescription is normaly filled for 2 times daily. Please contact pharmacy

## 2011-05-31 ENCOUNTER — Other Ambulatory Visit: Payer: Self-pay | Admitting: Internal Medicine

## 2011-07-07 ENCOUNTER — Other Ambulatory Visit: Payer: Self-pay | Admitting: Internal Medicine

## 2011-08-09 ENCOUNTER — Other Ambulatory Visit: Payer: Self-pay | Admitting: Internal Medicine

## 2011-08-09 NOTE — Telephone Encounter (Signed)
Ok to refill until  Check up appt in January 2013

## 2011-08-09 NOTE — Telephone Encounter (Signed)
Pls advise.  

## 2011-08-12 ENCOUNTER — Encounter: Payer: Self-pay | Admitting: Internal Medicine

## 2011-08-16 ENCOUNTER — Encounter: Payer: Self-pay | Admitting: Internal Medicine

## 2011-08-17 ENCOUNTER — Ambulatory Visit: Payer: 59 | Admitting: Internal Medicine

## 2011-08-17 ENCOUNTER — Ambulatory Visit (INDEPENDENT_AMBULATORY_CARE_PROVIDER_SITE_OTHER): Payer: 59 | Admitting: Physician Assistant

## 2011-08-17 ENCOUNTER — Encounter: Payer: Self-pay | Admitting: Internal Medicine

## 2011-08-17 ENCOUNTER — Encounter: Payer: Self-pay | Admitting: Physician Assistant

## 2011-08-17 VITALS — BP 90/64 | HR 72 | Ht 68.0 in | Wt 194.6 lb

## 2011-08-17 DIAGNOSIS — R1030 Lower abdominal pain, unspecified: Secondary | ICD-10-CM

## 2011-08-17 DIAGNOSIS — K625 Hemorrhage of anus and rectum: Secondary | ICD-10-CM

## 2011-08-17 DIAGNOSIS — R109 Unspecified abdominal pain: Secondary | ICD-10-CM

## 2011-08-17 MED ORDER — PEG-KCL-NACL-NASULF-NA ASC-C 100 G PO SOLR: ORAL | Status: DC

## 2011-08-17 NOTE — Progress Notes (Signed)
Reviewed and agree with management. Crimson Beer T. Maryela Tapper MD FACG 

## 2011-08-17 NOTE — Progress Notes (Signed)
Subjective:    Patient ID: Sandra Hudson, female    DOB: [birth year], 35 y.o.   MRN: [id number]  HPI Sandra Hudson is a pleasant generally healthy 35 year old white female who delivered a child in February of 2012. She comes in today for evaluation of rectal bleeding. She says she has been having symptoms over the past year intermittently with several days of bright red blood per rectum and mucus followed by periods of no bleeding. She states that she did have a tear at the time of her vaginal delivery however she has a recuperated from that and has been cleared by from a GYN standpoint and has no current symptoms. She has no complaint of rectal pain or discomfort She said she was having some intermittent bleeding even prior to her delivery. She has noticed intermittent lower abdominal cramping as well, states that her bowel movements have been normal and has at least one bowel movement per day. She is not certain that she has seen blood mixed in with her stool but thinks that she has. Her appetite has been good, she is still breast-feeding weight is stable. She has no complaints of nausea or vomiting. Her family history is negative for GI disease as far she is aware.  Most recent labs done 06/02/2011 shows a WBC of 6.1 hemoglobin 13.3 hematocrit 39.3 MCV of 84 platelets 279    Review of Systems  Constitutional: Negative.   HENT: Negative.   Eyes: Negative.   Respiratory: Negative.   Cardiovascular: Negative.   Gastrointestinal: Positive for abdominal pain and anal bleeding.  Genitourinary: Negative.   Musculoskeletal: Negative.   Skin: Negative.   Neurological: Negative.   Hematological: Negative.   Psychiatric/Behavioral: The patient is nervous/anxious.    Outpatient Encounter Prescriptions as of 08/17/2011  Medication Sig Dispense Refill  . buPROPion (WELLBUTRIN XL) 150 MG 24 hr tablet take 1 take by mouth once daily (NEEDS TO SCHDULE A FOLLOW UP APPOINTMENT BEFORE NEXT REFILL)  30 tablet  3  .  cetirizine (ZYRTEC) 10 MG tablet Take 10 mg by mouth daily.        . CYMBALTA 60 MG capsule take 1 capsule by mouth once daily  30 capsule  4  . ibuprofen (ADVIL,MOTRIN) 600 MG tablet Take 600 mg by mouth every 6 (six) hours as needed.        Marland Kitchen LORazepam (ATIVAN) 0.5 MG tablet Take 0.5 mg by mouth every 8 (eight) hours. 1 tab bid as needed for anxiety       . DISCONTD: fluticasone (FLONASE) 50 MCG/ACT nasal spray 2 sprays by Nasal route daily.        Marland Kitchen DISCONTD: hydrochlorothiazide 25 MG tablet Take 25 mg by mouth daily.        Marland Kitchen DISCONTD: zolpidem (AMBIEN) 10 MG tablet Take 10 mg by mouth at bedtime as needed.         No Known Allergies     Patient Active Problem List  Diagnoses  . HYPERLIPIDEMIA NEC/NOS  . DISORDER, ADJUSTMENT W/DEPRESSED MOOD  . MIGRAINE HEADACHE  . ALLERGIC RHINITIS  . INFERTILITY, ANOVULATORY  . ACNE VULGARIS, FACIAL  . SLEEPLESSNESS  . HEADACHE  . HYPERGLYCEMIA  . TRANSAMINASES, SERUM, ELEVATED  . PROTEINURIA  . RECTAL BLEEDING, HX OF  . PIH (pregnancy induced hypertension)  . Gestational diabetes mellitus    Objective:   Physical Exam Well-developed young white female in no acute distress pleasant, blood pressure 90/64 pulse 72. HEENT; nontraumatic normocephalic EOMI PERRLA sclera anicteric, Neck;  supple no JVD ,Cardiovascular; regular rate and rhythm with S1-S2 no murmur or gallop, Pulmonary; clear bilaterally, Abdomen; soft minimally tender in the left lower quadrant, she has some fullness in the left lower quadrant consistent with a palpable loop of bowel no mass or hepatosplenomegaly no guarding or rebound, bowel sounds present, Rectal; scant stool in the rectal vault Hemoccult negative, small external hemorrhoidal tag she is nontender to exam no palpable lesion. Extremities; no clubbing cyanosis or edema skin warm dry, Psych; mood and affect normal and appropriate.        Assessment & Plan:  #34 35 year old white female with a one-year history of  intermittent bright red blood per rectum, mucoid stools and intermittent lower abdominal cramping. Will rule out inflammatory bowel disease i.e. proctitis. Symptoms may be secondary to IBS and internal hemorrhoids, also cannot rule out polyp or occult lesion.  Plan; we'll schedule for colonoscopy with Dr. Rhea Belton, procedure discussed with the patient in detail and she is agreeable to proceed. As she is still breast-feeding have advised she dump her breast milk once she starts her prep and through the day after her procedure.

## 2011-09-09 ENCOUNTER — Other Ambulatory Visit: Payer: Self-pay | Admitting: Internal Medicine

## 2011-09-13 ENCOUNTER — Ambulatory Visit: Payer: 59 | Admitting: Internal Medicine

## 2011-09-16 ENCOUNTER — Encounter: Payer: Self-pay | Admitting: Internal Medicine

## 2011-09-16 ENCOUNTER — Ambulatory Visit (AMBULATORY_SURGERY_CENTER): Payer: 59 | Admitting: Internal Medicine

## 2011-09-16 ENCOUNTER — Other Ambulatory Visit: Payer: 59 | Admitting: Internal Medicine

## 2011-09-16 DIAGNOSIS — K625 Hemorrhage of anus and rectum: Secondary | ICD-10-CM

## 2011-09-16 DIAGNOSIS — R109 Unspecified abdominal pain: Secondary | ICD-10-CM

## 2011-09-16 HISTORY — PX: COLONOSCOPY: SHX174

## 2011-09-16 MED ORDER — SODIUM CHLORIDE 0.9 % IV SOLN: 500.0000 mL | INTRAVENOUS | Status: DC

## 2011-09-16 NOTE — Progress Notes (Signed)
Pt tolerated the colonoscopy well. Maw    Fair prep per Dr. Rhea Belton. maw

## 2011-09-16 NOTE — Patient Instructions (Signed)
Discharge instructions given with verbal understanding. Resume previous medications.  

## 2011-09-16 NOTE — Progress Notes (Signed)
Patient did not experience any of the following events: a burn prior to discharge; a fall within the facility; wrong site/side/patient/procedure/implant event; or a hospital transfer or hospital admission upon discharge from the facility. (G8907) Patient did not have preoperative order for IV antibiotic SSI prophylaxis. (G8918)  

## 2011-09-19 ENCOUNTER — Telehealth: Payer: Self-pay

## 2011-09-19 NOTE — Telephone Encounter (Signed)
Left message

## 2011-10-25 ENCOUNTER — Other Ambulatory Visit (INDEPENDENT_AMBULATORY_CARE_PROVIDER_SITE_OTHER): Payer: 59

## 2011-10-25 DIAGNOSIS — Z0000 Encounter for general adult medical examination without abnormal findings: Secondary | ICD-10-CM

## 2011-10-25 LAB — CBC WITH DIFFERENTIAL/PLATELET
Eosinophils Relative: 3 % (ref 0.0–5.0)
Monocytes Relative: 7.3 % (ref 3.0–12.0)
Neutrophils Relative %: 50.3 % (ref 43.0–77.0)
Platelets: 269 10*3/uL (ref 150.0–400.0)
WBC: 6.7 10*3/uL (ref 4.5–10.5)

## 2011-10-25 LAB — POCT URINALYSIS DIPSTICK
Nitrite, UA: NEGATIVE
Spec Grav, UA: >=1.03
Urobilinogen, UA: 0.2

## 2011-10-25 LAB — LIPID PANEL
HDL: 62.4 mg/dL (ref >39.00)
Total CHOL/HDL Ratio: 3
VLDL: 8.6 mg/dL (ref 0.0–40.0)

## 2011-10-25 LAB — HEPATIC FUNCTION PANEL
ALT: 20 U/L (ref 0–35)
AST: 16 U/L (ref 0–37)
Bilirubin, Direct: 0 mg/dL (ref 0.0–0.3)
Total Bilirubin: 0.7 mg/dL (ref 0.3–1.2)

## 2011-10-25 LAB — BASIC METABOLIC PANEL
BUN: 19 mg/dL (ref 6–23)
Creatinine, Ser: 0.8 mg/dL (ref 0.4–1.2)
GFR: 82.45 mL/min (ref >60.00)

## 2011-10-25 LAB — LDL CHOLESTEROL, DIRECT: Direct LDL: 134.2 mg/dL

## 2011-11-07 ENCOUNTER — Ambulatory Visit (INDEPENDENT_AMBULATORY_CARE_PROVIDER_SITE_OTHER): Payer: 59 | Admitting: Internal Medicine

## 2011-11-07 ENCOUNTER — Encounter: Payer: Self-pay | Admitting: Internal Medicine

## 2011-11-07 VITALS — BP 100/70 | HR 90 | Temp 97.9°F | Ht 67.5 in | Wt 196.0 lb

## 2011-11-07 DIAGNOSIS — F4321 Adjustment disorder with depressed mood: Secondary | ICD-10-CM

## 2011-11-07 DIAGNOSIS — Z0000 Encounter for general adult medical examination without abnormal findings: Secondary | ICD-10-CM

## 2011-11-07 DIAGNOSIS — E785 Hyperlipidemia, unspecified: Secondary | ICD-10-CM

## 2011-11-07 DIAGNOSIS — R7309 Other abnormal glucose: Secondary | ICD-10-CM

## 2011-11-07 DIAGNOSIS — J309 Allergic rhinitis, unspecified: Secondary | ICD-10-CM

## 2011-11-07 DIAGNOSIS — G43909 Migraine, unspecified, not intractable, without status migrainosus: Secondary | ICD-10-CM

## 2011-11-07 DIAGNOSIS — R21 Rash and other nonspecific skin eruption: Secondary | ICD-10-CM

## 2011-11-07 MED ORDER — FLUOCINONIDE-E 0.05 % EX CREA: TOPICAL_CREAM | Freq: Two times a day (BID) | CUTANEOUS | Status: AC

## 2011-11-07 MED ORDER — BUPROPION HCL ER (XL) 300 MG PO TB24: 300.0000 mg | ORAL_TABLET | Freq: Every day | ORAL | Status: DC

## 2011-11-07 NOTE — Patient Instructions (Addendum)
Continue lifestyle intervention healthy eating and exercise . Go back up to 300 mg of wellbutrin per day as discussed. Sleep deprivation can affect concentration. May want to log sleep patterns.  Can try topical steroid for 2 weeks or so and if not better see derm .   Otherwise  Recheck in 6 months.

## 2011-11-07 NOTE — Assessment & Plan Note (Signed)
Better today with post partum weigh tloss

## 2011-11-07 NOTE — Progress Notes (Signed)
Subjective:    Patient ID: Sandra Hudson, female    DOB: April 28, 1976, 36 y.o.   MRN: [id number]  HPI Patient comes in today for preventive visit and follow-up of medical issues. Update of her history since her last visit. MOOD  forgetful ness   Did better on 300mg   wellbutrin xl   Than 150. Some how dose got changed at pharmacy and has tried this does for about 3 months  .  Coming on anniversary of fathers death.  No hopelessness .  BP: has been good BG: has been losing weight  And avoiding sugars as much as possible Tired:  25 mos old not sleeping regularly ? If related .  Rash: went away with preg and now coming back  See below on elbow and now patch o n face  No pets currently  ( cat dies a while back)  Review of Systems ROS:  GEN/ HEENTNo fever, significant weight changes sweats headaches vision problems hearing changes, CV/ PULM; No chest pain shortness of breath cough, syncope,edema  change in exercise tolerance. GI /GU: No adominal pain, vomiting, change in bowel habits. No blood in the stool. No significant GU symptoms. Periods back using condoms  Still gets has at times.  No imitrex . SKIN/HEME: ,no acute skin rashes but flaky area on elbow and one patch on face. suspicious lesions or bleeding. No lymphadenopathy, nodules, masses.  NEURO/ PSYCH:  No neurologic signs such as weakness numbness No depression anxiety. IMM/ Allergy: No unusual infections.  Allergy .   REST of 12 system review negative '    Objective:   Physical Exam Wt Readings from Last 3 Encounters:  11/07/11 196 lb (88.905 kg)  09/16/11 194 lb (87.998 kg)  08/17/11 194 lb 9.6 oz (88.27 kg)    Physical Exam: Vital signs reviewed HYQ:MVHQ is a well-developed well-nourished alert cooperative  white female who appears her stated age in no acute distress.  HEENT: normocephalic atraumatic , Eyes: PERRL EOM's full, conjunctiva clear, Nares: paten,t no deformity discharge or tenderness., Ears: no deformity EAC's  clear TMs with normal landmarks. Mouth: clear OP, no lesions, edema.  Moist mucous membranes. Dentition in adequate repair. NECK: supple without masses, thyromegaly or bruits. CHEST/PULM:  Clear to auscultation and percussion breath sounds equal no wheeze , rales or rhonchi. No chest wall deformities or tenderness. CV: PMI is nondisplaced, S1 S2 no gallops, murmurs, rubs. Peripheral pulses are full without delay.No JVD .  Breast: normal by inspection . No dimpling, discharge, masses, tenderness or discharge . ABDOMEN: Bowel sounds normal nontender  No guard or rebound, no hepato splenomegal no CVA tenderness.  No hernia. Extremtities:  No clubbing cyanosis or edema, no acute joint swelling or redness no focal atrophy NEURO:  Oriented x3, cranial nerves 3-12 appear to be intact, no obvious focal weakness,gait within normal limits no abnormal reflexes or asymmetrical SKIN: normal turgor, color, no bruising or petechiae. Pink patches  Scaly with discrete edge left extensor elbow  And diffuse right   Small patch on trunk and left forehead  PSYCH: Oriented, good eye contact, no obvious depression anxiety, cognition and judgment appear normal. LN: no cervical axillary inguinal adenopathy    Lab Results  Component Value Date   WBC 6.7 10/25/2011   HGB 13.5 10/25/2011   HCT 39.6 10/25/2011   PLT 269.0 10/25/2011   GLUCOSE 92 10/25/2011   CHOL 216* 10/25/2011   TRIG 43.0 10/25/2011   HDL 62.40 10/25/2011   LDLDIRECT 134.2 10/25/2011  LDLCALC 118* 08/31/2009   ALT 20 10/25/2011   AST 16 10/25/2011   NA 142 10/25/2011   K 3.9 10/25/2011   CL 107 10/25/2011   CREATININE 0.8 10/25/2011   BUN 19 10/25/2011   CO2 27 10/25/2011   TSH 3.87 10/25/2011   HGBA1C 5.7 12/07/2009   MICROALBUR 1.1 09/15/2010         Assessment & Plan:  Preventive Health Care Counseled regarding healthy nutrition, exercise, sleep, injury prevention, calcium vit d and healthy weight .  Mood stable: for anniversary  Unsure how doseing of wellbutrin  went down to 150 but seems to do better on 300 and will increase dose for now.  concentration could also be sleep deprivation.   Rash : ? Atypical atopic vs other    Such as tinea   Treat empirically and fu with derm if needed LIPIDS: better Hyperglycemia better  LIPIDS  Better  Allergic stable MHAs controlled   Taking prn nsaid.

## 2011-11-24 ENCOUNTER — Telehealth: Payer: Self-pay | Admitting: *Deleted

## 2011-11-24 MED ORDER — DULOXETINE HCL 60 MG PO CPEP: 60.0000 mg | ORAL_CAPSULE | Freq: Every day | ORAL | Status: DC

## 2011-11-24 NOTE — Telephone Encounter (Signed)
Refill on cymbalta 

## 2011-12-01 ENCOUNTER — Other Ambulatory Visit: Payer: Self-pay | Admitting: Internal Medicine

## 2011-12-01 NOTE — Telephone Encounter (Signed)
I don't see this medication on pt's medication list. I left a message for pt to call back about this.

## 2011-12-05 ENCOUNTER — Other Ambulatory Visit: Payer: Self-pay | Admitting: Internal Medicine

## 2011-12-14 ENCOUNTER — Telehealth: Payer: Self-pay | Admitting: *Deleted

## 2011-12-14 NOTE — Telephone Encounter (Signed)
Pt needs a refill on imitrex 100mg  tabs. She is now back on it for migraines during cycle.

## 2011-12-14 NOTE — Telephone Encounter (Signed)
#  9 with 1 refill

## 2011-12-15 MED ORDER — SUMATRIPTAN SUCCINATE 100 MG PO TABS: 100.0000 mg | ORAL_TABLET | ORAL | Status: DC | PRN

## 2011-12-15 NOTE — Telephone Encounter (Signed)
rx sent to pharmacy

## 2012-05-28 ENCOUNTER — Other Ambulatory Visit: Payer: Self-pay | Admitting: Internal Medicine

## 2012-05-28 NOTE — Telephone Encounter (Signed)
Sent note to Dr. Clent Ridges.  WP out of the office this week.  Waiting on reply.

## 2012-05-28 NOTE — Telephone Encounter (Signed)
Pt needs refills.  Last seen in physical appointment on 11/07/11.  Has no future appt.  Please advise.  Thanks!!!

## 2012-05-30 ENCOUNTER — Telehealth: Payer: Self-pay | Admitting: Family Medicine

## 2012-05-30 NOTE — Telephone Encounter (Signed)
Call in #30 with no rf  

## 2012-05-30 NOTE — Telephone Encounter (Signed)
Last seen:  11/07/11 (CPE) No follow up Last filled 11/07/11 #30 6 refills.  Please advise new refills.

## 2012-05-31 NOTE — Telephone Encounter (Signed)
Sent to the pharmacy by e-scribe. 

## 2012-07-16 ENCOUNTER — Other Ambulatory Visit: Payer: Self-pay | Admitting: Family Medicine

## 2012-07-16 NOTE — Telephone Encounter (Signed)
Call in #30 with no rf  

## 2012-08-15 ENCOUNTER — Other Ambulatory Visit: Payer: Self-pay | Admitting: Family Medicine

## 2012-08-17 NOTE — Telephone Encounter (Signed)
This is a Dr. Panosh patient  

## 2012-08-22 ENCOUNTER — Ambulatory Visit (INDEPENDENT_AMBULATORY_CARE_PROVIDER_SITE_OTHER): Payer: 59 | Admitting: *Deleted

## 2012-08-22 ENCOUNTER — Other Ambulatory Visit: Payer: Self-pay | Admitting: Family Medicine

## 2012-08-22 DIAGNOSIS — Z23 Encounter for immunization: Secondary | ICD-10-CM

## 2012-08-22 MED ORDER — BUPROPION HCL ER (XL) 300 MG PO TB24: 300.0000 mg | ORAL_TABLET | Freq: Every day | ORAL | Status: DC

## 2012-10-19 ENCOUNTER — Other Ambulatory Visit: Payer: Self-pay | Admitting: Internal Medicine

## 2012-11-06 ENCOUNTER — Other Ambulatory Visit (INDEPENDENT_AMBULATORY_CARE_PROVIDER_SITE_OTHER): Payer: 59

## 2012-11-06 DIAGNOSIS — Z0000 Encounter for general adult medical examination without abnormal findings: Secondary | ICD-10-CM

## 2012-11-06 LAB — LIPID PANEL
Total CHOL/HDL Ratio: 5
VLDL: 26.2 mg/dL (ref 0.0–40.0)

## 2012-11-06 LAB — CBC WITH DIFFERENTIAL/PLATELET
Basophils Relative: 0.6 % (ref 0.0–3.0)
Eosinophils Relative: 5.5 % — ABNORMAL HIGH (ref 0.0–5.0)
HCT: 41.1 % (ref 36.0–46.0)
Lymphs Abs: 2.5 10*3/uL (ref 0.7–4.0)
MCV: 85.4 fl (ref 78.0–100.0)
Monocytes Absolute: 0.4 10*3/uL (ref 0.1–1.0)
Monocytes Relative: 5.9 % (ref 3.0–12.0)
Neutrophils Relative %: 49.4 % (ref 43.0–77.0)
Platelets: 309 10*3/uL (ref 150.0–400.0)
RBC: 4.81 Mil/uL (ref 3.87–5.11)
WBC: 6.4 10*3/uL (ref 4.5–10.5)

## 2012-11-06 LAB — HEPATIC FUNCTION PANEL
ALT: 26 U/L (ref 0–35)
AST: 19 U/L (ref 0–37)
Bilirubin, Direct: 0 mg/dL (ref 0.0–0.3)
Total Bilirubin: 0.5 mg/dL (ref 0.3–1.2)
Total Protein: 7.1 g/dL (ref 6.0–8.3)

## 2012-11-06 LAB — BASIC METABOLIC PANEL
BUN: 16 mg/dL (ref 6–23)
Chloride: 103 mEq/L (ref 96–112)
GFR: 71.98 mL/min (ref >60.00)
Potassium: 4.1 mEq/L (ref 3.5–5.1)
Sodium: 136 mEq/L (ref 135–145)

## 2012-11-13 ENCOUNTER — Ambulatory Visit (INDEPENDENT_AMBULATORY_CARE_PROVIDER_SITE_OTHER): Payer: 59 | Admitting: Internal Medicine

## 2012-11-13 ENCOUNTER — Encounter: Payer: Self-pay | Admitting: Internal Medicine

## 2012-11-13 VITALS — BP 110/74 | HR 104 | Temp 98.0°F | Ht 67.75 in | Wt 210.0 lb

## 2012-11-13 DIAGNOSIS — E785 Hyperlipidemia, unspecified: Secondary | ICD-10-CM

## 2012-11-13 DIAGNOSIS — F4321 Adjustment disorder with depressed mood: Secondary | ICD-10-CM

## 2012-11-13 DIAGNOSIS — R7309 Other abnormal glucose: Secondary | ICD-10-CM

## 2012-11-13 DIAGNOSIS — Z0000 Encounter for general adult medical examination without abnormal findings: Secondary | ICD-10-CM

## 2012-11-13 DIAGNOSIS — R51 Headache: Secondary | ICD-10-CM

## 2012-11-13 MED ORDER — BUPROPION HCL ER (XL) 300 MG PO TB24: 300.0000 mg | ORAL_TABLET | ORAL | Status: DC

## 2012-11-13 MED ORDER — DULOXETINE HCL 60 MG PO CPEP: 60.0000 mg | ORAL_CAPSULE | Freq: Every day | ORAL | Status: DC

## 2012-11-13 MED ORDER — SUMATRIPTAN SUCCINATE 100 MG PO TABS: 100.0000 mg | ORAL_TABLET | ORAL | Status: DC | PRN

## 2012-11-13 NOTE — Progress Notes (Signed)
Chief Complaint  Patient presents with  . Annual Exam    Medication refills    HPI: Patient comes in today for Preventive Health Care visit  No major change in health status since last visit . HAs :  only  Menstrual    Trigger doing much better  Periods fairly regular    Long inbetween considering trying to get pregnant again Mood stable some down days .   Sleep  Get it off and on related to her 36 year old  Has been exercising as much with weather and not paying attention to diet.   Sleep ok  " does miss lunesta"   ROS:  GEN/ HEENT: No fever, significant weight changes sweats headaches vision problems hearing changes, CV/ PULM; No chest pain shortness of breath cough, syncope,edema  change in exercise tolerance. GI /GU: No adominal pain, vomiting, change in bowel habits. No blood in the stool. No significant GU symptoms. SKIN/HEME: ,no acute skin rashes suspicious lesions or bleeding. No lymphadenopathy, nodules, masses.  NEURO/ PSYCH:  No neurologic signs such as weakness numbness. No depression anxiety. IMM/ Allergy: No unusual infections.  Allergy .   REST of 12 system review negative except as per HPI   Past Medical History  Diagnosis Date  . Allergy   . Headache   . Female infertility   . Allergic rhinitis due to dust     Family History  Problem Relation Age of Onset  . Depression Father   . Depression Mother   . Breast cancer      maternal great aunt  . Breast cancer Paternal Grandmother   . ALS Father     multiple system atropy    History   Social History  . Marital Status: Married    Spouse Name: N/A    Number of Children: 1  . Years of Education: N/A   Occupational History  .     Social History Main Topics  . Smoking status: Never Smoker   . Smokeless tobacco: Never Used  . Alcohol Use: No     Comment: occasionally  . Drug Use: No  . Sexually Active: None   Other Topics Concern  . 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    Outpatient Encounter Prescriptions as of 11/13/2012  Medication Sig Dispense Refill  . buPROPion (WELLBUTRIN XL) 300 MG 24 hr tablet Take 1 tablet (300 mg total) by mouth every morning.  90 tablet  3  . cetirizine (ZYRTEC) 10 MG tablet Take 10 mg by mouth daily.        . DULoxetine (CYMBALTA) 60 MG capsule Take 1 capsule (60 mg total) by mouth daily.  90 capsule  3  . ibuprofen (ADVIL,MOTRIN) 600 MG tablet Take 600 mg by mouth every 6 (six) hours as needed.        . SUMAtriptan (IMITREX) 100 MG tablet Take 1 tablet (100 mg total) by mouth every 2 (two) hours as needed for migraine.  27 tablet  3  . triamcinolone cream (KENALOG) 0.1 % Apply 1 application topically 2 (two) times daily.      . [DISCONTINUED] buPROPion (WELLBUTRIN XL) 300 MG 24 hr tablet take 1 tablet by mouth once daily  30 tablet  0  . [DISCONTINUED] DULoxetine (CYMBALTA) 60 MG capsule Take 1 capsule (60 mg total) by mouth daily.  90 capsule  3  . [DISCONTINUED] SUMAtriptan (IMITREX) 100 MG tablet Take 1 tablet (100 mg total) by mouth every 2 (two) hours as needed for migraine.  9 tablet  1  . LORazepam (ATIVAN) 0.5 MG tablet Take 0.5 mg by mouth every 8 (eight) hours. 1 tab bid as needed for anxiety       . [DISCONTINUED] buPROPion (WELLBUTRIN XL) 150 MG 24 hr tablet take 1 take by mouth once daily (NEEDS TO SCHDULE A FOLLOW UP APPOINTMENT BEFORE NEXT REFILL)  30 tablet  3  . [DISCONTINUED] CYMBALTA 60 MG capsule take 1 capsule by mouth once daily  30 capsule  4    EXAM:  BP 110/74  Pulse 104  Temp 98 F (36.7 C) (Oral)  Ht 5' 7.75" (1.721 m)  Wt 210 lb (95.255 kg)  BMI 32.17 kg/m2  SpO2 98%  LMP 11/02/2012  Body mass index is 32.17 kg/(m^2).  Physical Exam: Vital signs reviewed WUJ:WJXB is a well-developed well-nourished alert cooperative   female who appears her stated age in no acute distress.  HEENT: normocephalic atraumatic , Eyes:  PERRL EOM's full, conjunctiva clear, Nares: paten,t no deformity discharge or tenderness., Ears: no deformity EAC's clear TMs with normal landmarks. Mouth: clear OP, no lesions, edema.  Moist mucous membranes. Dentition in adequate repair. NECK: supple without masses, thyromegaly or bruits. CHEST/PULM:  Clear to auscultation and percussion breath sounds equal no wheeze , rales or rhonchi. No chest wall deformities or tenderness. Breast: normal by inspection . No dimpling, discharge, masses, tenderness or discharge . CV: PMI is nondisplaced, S1 S2 no gallops, murmurs, rubs. Peripheral pulses are full without delay.No JVD .  ABDOMEN: Bowel sounds normal nontender  No guard or rebound, no hepato splenomegal no CVA tenderness.  No hernia. Extremtities:  No clubbing cyanosis or edema, no acute joint swelling or redness no focal atrophy NEURO:  Oriented x3, cranial nerves 3-12 appear to be intact, no obvious focal weakness,gait within normal limits no abnormal reflexes or asymmetrical SKIN: No acute rashes normal turgor, color, no bruising or petechiae. PSYCH: Oriented, good eye contact, no obvious depression anxiety, cognition and judgment appear normal. LN: no cervical axillary inguinal adenopathy  Lab Results  Component Value Date   WBC 6.4 11/06/2012   HGB 13.9 11/06/2012   HCT 41.1 11/06/2012   PLT 309.0 11/06/2012   GLUCOSE 122* 11/06/2012   CHOL 235* 11/06/2012   TRIG 131.0 11/06/2012   HDL 43.90 11/06/2012   LDLDIRECT 158.0 11/06/2012   LDLCALC 118* 08/31/2009   ALT 26 11/06/2012   AST 19 11/06/2012   NA 136 11/06/2012   K 4.1 11/06/2012   CL 103 11/06/2012   CREATININE 0.9 11/06/2012   BUN 16 11/06/2012   CO2 28 11/06/2012   TSH 4.88 11/06/2012   HGBA1C 5.7 12/07/2009   MICROALBUR 1.1 09/15/2010    ASSESSMENT AND PLAN:  Discussed the following assessment and plan:  1. Encounter for preventive health examination   2. HYPERGLYCEMIA pre diabetes    pre diabetic and risk   dis lsi and then fu  in 3 months   3. DISORDER, ADJUSTMENT W/DEPRESSED MOOD    seems stable continue on meds for now  4. Other and unspecified hyperlipidemia    ldl inc attend to lsi  and follow  5. Headache    stable triggers can refill imitrex    Patient Care Team: [name], MD as PCP - General Christia Reading, MD Nigel Bridgeman, CNM as Midwife Beverley Fiedler, MD (  Gastroenterology) Jacqlyn Krauss as Referring Physician (Dermatology) Patient Instructions  Intensify lifestyle interventions. Pay attention   To intake and carbs   Increase physical activity.  Take folic acid sulement  Check labs in about 3 months and then ROV   To see how sugars are doing.   Preventive Care for Adults, Female A healthy lifestyle and preventive care can promote health and wellness. Preventive health guidelines for women include the following key practices.  A routine yearly physical is a good way to check with your caregiver about your health and preventive screening. It is a chance to share any concerns and updates on your health, and to receive a thorough exam.  Visit your dentist for a routine exam and preventive care every 6 months. Brush your teeth twice a day and floss once a day. Good oral hygiene prevents tooth decay and gum disease.  The frequency of eye exams is based on your age, health, family medical history, use of contact lenses, and other factors. Follow your caregiver's recommendations for frequency of eye exams.  Eat a healthy diet. Foods like vegetables, fruits, whole grains, low-fat dairy products, and lean protein foods contain the nutrients you need without too many calories. Decrease your intake of foods high in solid fats, added sugars, and salt. Eat the right amount of calories for you.Get information about a proper diet from your caregiver, if necessary.  Regular physical exercise is one of the most important things you can do for your health. Most adults should get at least 150 minutes of  moderate-intensity exercise (any activity that increases your heart rate and causes you to sweat) each week. In addition, most adults need muscle-strengthening exercises on 2 or more days a week.  Maintain a healthy weight. The body mass index (BMI) is a screening tool to identify possible weight problems. It provides an estimate of body fat based on height and weight. Your caregiver can help determine your BMI, and can help you achieve or maintain a healthy weight.For adults 20 years and older:  A BMI below 18.5 is considered underweight.  A BMI of 18.5 to 24.9 is normal.  A BMI of 25 to 29.9 is considered overweight.  A BMI of 30 and above is considered obese.  Maintain normal blood lipids and cholesterol levels by exercising and minimizing your intake of saturated fat. Eat a balanced diet with plenty of fruit and vegetables. Blood tests for lipids and cholesterol should begin at age 38 and be repeated every 5 years. If your lipid or cholesterol levels are high, you are over 50, or you are at high risk for heart disease, you may need your cholesterol levels checked more frequently.Ongoing high lipid and cholesterol levels should be treated with medicines if diet and exercise are not effective.  If you smoke, find out from your caregiver how to quit. If you do not use tobacco, do not start.  If you are pregnant, do not drink alcohol. If you are breastfeeding, be very cautious about drinking alcohol. If you are not pregnant and choose to drink alcohol, do not exceed 1 drink per day. One drink is considered to be 12 ounces (355 mL) of beer, 5 ounces (148 mL) of wine, or 1.5 ounces (44 mL) of liquor.  Avoid use of street drugs. Do not share needles with anyone. Ask for help if you need support or instructions about stopping the use of drugs.  High blood pressure causes heart disease and increases the risk of stroke.  Your blood pressure should be checked at least every 1 to 2 years. Ongoing high  blood pressure should be treated with medicines if weight loss and exercise are not effective.  If you are 6 to 36 years old, ask your caregiver if you should take aspirin to prevent strokes.  Diabetes screening involves taking a blood sample to check your fasting blood sugar level. This should be done once every 3 years, after age 38, if you are within normal weight and without risk factors for diabetes. Testing should be considered at a younger age or be carried out more frequently if you are overweight and have at least 1 risk factor for diabetes.  Breast cancer screening is essential preventive care for women. You should practice "breast self-awareness." This means understanding the normal appearance and feel of your breasts and may include breast self-examination. Any changes detected, no matter how small, should be reported to a caregiver. Women in their 71s and 30s should have a clinical breast exam (CBE) by a caregiver as part of a regular health exam every 1 to 3 years. After age 49, women should have a CBE every year. Starting at age 57, women should consider having a mammography (breast X-ray test) every year. Women who have a family history of breast cancer should talk to their caregiver about genetic screening. Women at a high risk of breast cancer should talk to their caregivers about having magnetic resonance imaging (MRI) and a mammography every year.  The Pap test is a screening test for cervical cancer. A Pap test can show cell changes on the cervix that might become cervical cancer if left untreated. A Pap test is a procedure in which cells are obtained and examined from the lower end of the uterus (cervix).  Women should have a Pap test starting at age 1.  Between ages 56 and 98, Pap tests should be repeated every 2 years.  Beginning at age 23, you should have a Pap test every 3 years as long as the past 3 Pap tests have been normal.  Some women have medical problems that  increase the chance of getting cervical cancer. Talk to your caregiver about these problems. It is especially important to talk to your caregiver if a new problem develops soon after your last Pap test. In these cases, your caregiver may recommend more frequent screening and Pap tests.  The above recommendations are the same for women who have or have not gotten the vaccine for human papillomavirus (HPV).  If you had a hysterectomy for a problem that was not cancer or a condition that could lead to cancer, then you no longer need Pap tests. Even if you no longer need a Pap test, a regular exam is a good idea to make sure no other problems are starting.  If you are between ages 32 and 87, and you have had normal Pap tests going back 10 years, you no longer need Pap tests. Even if you no longer need a Pap test, a regular exam is a good idea to make sure no other problems are starting.  If you have had past treatment for cervical cancer or a condition that could lead to cancer, you need Pap tests and screening for cancer for at least 20 years after your treatment.  If Pap tests have been discontinued, risk factors (such as a new sexual partner) need to be reassessed to determine if screening should be resumed.  The HPV test is an additional test  that may be used for cervical cancer screening. The HPV test looks for the virus that can cause the cell changes on the cervix. The cells collected during the Pap test can be tested for HPV. The HPV test could be used to screen women aged 26 years and older, and should be used in women of any age who have unclear Pap test results. After the age of 86, women should have HPV testing at the same frequency as a Pap test.  Colorectal cancer can be detected and often prevented. Most routine colorectal cancer screening begins at the age of 63 and continues through age 32. However, your caregiver may recommend screening at an earlier age if you have risk factors for colon  cancer. On a yearly basis, your caregiver may provide home test kits to check for hidden blood in the stool. Use of a small camera at the end of a tube, to directly examine the colon (sigmoidoscopy or colonoscopy), can detect the earliest forms of colorectal cancer. Talk to your caregiver about this at age 36, when routine screening begins. Direct examination of the colon should be repeated every 5 to 10 years through age 55, unless early forms of pre-cancerous polyps or small growths are found.  Hepatitis C blood testing is recommended for all people born from [birth year] through 1964 and any individual with known risks for hepatitis C.  Practice safe sex. Use condoms and avoid high-risk sexual practices to reduce the spread of sexually transmitted infections (STIs). STIs include gonorrhea, chlamydia, syphilis, trichomonas, herpes, HPV, and human immunodeficiency virus (HIV). Herpes, HIV, and HPV are viral illnesses that have no cure. They can result in disability, cancer, and death. Sexually active women aged 57 and younger should be checked for chlamydia. Older women with new or multiple partners should also be tested for chlamydia. Testing for other STIs is recommended if you are sexually active and at increased risk.  Osteoporosis is a disease in which the bones lose minerals and strength with aging. This can result in serious bone fractures. The risk of osteoporosis can be identified using a bone density scan. Women ages 48 and over and women at risk for fractures or osteoporosis should discuss screening with their caregivers. Ask your caregiver whether you should take a calcium supplement or vitamin D to reduce the rate of osteoporosis.  Menopause can be associated with physical symptoms and risks. Hormone replacement therapy is available to decrease symptoms and risks. You should talk to your caregiver about whether hormone replacement therapy is right for you.  Use sunscreen with sun protection factor  (SPF) of 30 or more. Apply sunscreen liberally and repeatedly throughout the day. You should seek shade when your shadow is shorter than you. Protect yourself by wearing long sleeves, pants, a wide-brimmed hat, and sunglasses year round, whenever you are outdoors.  Once a month, do a whole body skin exam, using a mirror to look at the skin on your back. Notify your caregiver of new moles, moles that have irregular borders, moles that are larger than a pencil eraser, or moles that have changed in shape or color.  Stay current with required immunizations.  Influenza. You need a dose every fall (or winter). The composition of the flu vaccine changes each year, so being vaccinated once is not enough.  Pneumococcal polysaccharide. You need 1 to 2 doses if you smoke cigarettes or if you have certain chronic medical conditions. You need 1 dose at age 34 (or older) if you have  never been vaccinated.  Tetanus, diphtheria, pertussis (Tdap, Td). Get 1 dose of Tdap vaccine if you are younger than age 53, are over 72 and have contact with an infant, are a Research scientist (physical sciences), are pregnant, or simply want to be protected from whooping cough. After that, you need a Td booster dose every 10 years. Consult your caregiver if you have not had at least 3 tetanus and diphtheria-containing shots sometime in your life or have a deep or dirty wound.  HPV. You need this vaccine if you are a woman age 57 or younger. The vaccine is given in 3 doses over 6 months.  Measles, mumps, rubella (MMR). You need at least 1 dose of MMR if you were born in 1957 or later. You may also need a second dose.  Meningococcal. If you are age 59 to 41 and a first-year college student living in a residence hall, or have one of several medical conditions, you need to get vaccinated against meningococcal disease. You may also need additional booster doses.  Zoster (shingles). If you are age 73 or older, you should get this vaccine.  Varicella  (chickenpox). If you have never had chickenpox or you were vaccinated but received only 1 dose, talk to your caregiver to find out if you need this vaccine.  Hepatitis A. You need this vaccine if you have a specific risk factor for hepatitis A virus infection or you simply wish to be protected from this disease. The vaccine is usually given as 2 doses, 6 to 18 months apart.  Hepatitis B. You need this vaccine if you have a specific risk factor for hepatitis B virus infection or you simply wish to be protected from this disease. The vaccine is given in 3 doses, usually over 6 months.     Neta Mends. Panosh M.D. Health Maintenance  Topic Date Due  . Influenza Vaccine  06/17/2013  . Pap Smear  06/19/2015  . Tetanus/tdap  07/16/2018   Health Maintenance Review

## 2012-11-13 NOTE — Patient Instructions (Addendum)
Intensify lifestyle interventions. Pay attention   To intake and carbs   Increase physical activity.  Take folic acid sulement  Check labs in about 3 months and then ROV   To see how sugars are doing.   Preventive Care for Adults, Female A healthy lifestyle and preventive care can promote health and wellness. Preventive health guidelines for women include the following key practices.  A routine yearly physical is a good way to check with your caregiver about your health and preventive screening. It is a chance to share any concerns and updates on your health, and to receive a thorough exam.  Visit your dentist for a routine exam and preventive care every 6 months. Brush your teeth twice a day and floss once a day. Good oral hygiene prevents tooth decay and gum disease.  The frequency of eye exams is based on your age, health, family medical history, use of contact lenses, and other factors. Follow your caregiver's recommendations for frequency of eye exams.  Eat a healthy diet. Foods like vegetables, fruits, whole grains, low-fat dairy products, and lean protein foods contain the nutrients you need without too many calories. Decrease your intake of foods high in solid fats, added sugars, and salt. Eat the right amount of calories for you.Get information about a proper diet from your caregiver, if necessary.  Regular physical exercise is one of the most important things you can do for your health. Most adults should get at least 150 minutes of moderate-intensity exercise (any activity that increases your heart rate and causes you to sweat) each week. In addition, most adults need muscle-strengthening exercises on 2 or more days a week.  Maintain a healthy weight. The body mass index (BMI) is a screening tool to identify possible weight problems. It provides an estimate of body fat based on height and weight. Your caregiver can help determine your BMI, and can help you achieve or maintain a healthy  weight.For adults 20 years and older:  A BMI below 18.5 is considered underweight.  A BMI of 18.5 to 24.9 is normal.  A BMI of 25 to 29.9 is considered overweight.  A BMI of 30 and above is considered obese.  Maintain normal blood lipids and cholesterol levels by exercising and minimizing your intake of saturated fat. Eat a balanced diet with plenty of fruit and vegetables. Blood tests for lipids and cholesterol should begin at age 55 and be repeated every 5 years. If your lipid or cholesterol levels are high, you are over 50, or you are at high risk for heart disease, you may need your cholesterol levels checked more frequently.Ongoing high lipid and cholesterol levels should be treated with medicines if diet and exercise are not effective.  If you smoke, find out from your caregiver how to quit. If you do not use tobacco, do not start.  If you are pregnant, do not drink alcohol. If you are breastfeeding, be very cautious about drinking alcohol. If you are not pregnant and choose to drink alcohol, do not exceed 1 drink per day. One drink is considered to be 12 ounces (355 mL) of beer, 5 ounces (148 mL) of wine, or 1.5 ounces (44 mL) of liquor.  Avoid use of street drugs. Do not share needles with anyone. Ask for help if you need support or instructions about stopping the use of drugs.  High blood pressure causes heart disease and increases the risk of stroke. Your blood pressure should be checked at least every 1 to  2 years. Ongoing high blood pressure should be treated with medicines if weight loss and exercise are not effective.  If you are 15 to 36 years old, ask your caregiver if you should take aspirin to prevent strokes.  Diabetes screening involves taking a blood sample to check your fasting blood sugar level. This should be done once every 3 years, after age 46, if you are within normal weight and without risk factors for diabetes. Testing should be considered at a younger age or be  carried out more frequently if you are overweight and have at least 1 risk factor for diabetes.  Breast cancer screening is essential preventive care for women. You should practice "breast self-awareness." This means understanding the normal appearance and feel of your breasts and may include breast self-examination. Any changes detected, no matter how small, should be reported to a caregiver. Women in their 14s and 30s should have a clinical breast exam (CBE) by a caregiver as part of a regular health exam every 1 to 3 years. After age 64, women should have a CBE every year. Starting at age 52, women should consider having a mammography (breast X-ray test) every year. Women who have a family history of breast cancer should talk to their caregiver about genetic screening. Women at a high risk of breast cancer should talk to their caregivers about having magnetic resonance imaging (MRI) and a mammography every year.  The Pap test is a screening test for cervical cancer. A Pap test can show cell changes on the cervix that might become cervical cancer if left untreated. A Pap test is a procedure in which cells are obtained and examined from the lower end of the uterus (cervix).  Women should have a Pap test starting at age 49.  Between ages 11 and 76, Pap tests should be repeated every 2 years.  Beginning at age 52, you should have a Pap test every 3 years as long as the past 3 Pap tests have been normal.  Some women have medical problems that increase the chance of getting cervical cancer. Talk to your caregiver about these problems. It is especially important to talk to your caregiver if a new problem develops soon after your last Pap test. In these cases, your caregiver may recommend more frequent screening and Pap tests.  The above recommendations are the same for women who have or have not gotten the vaccine for human papillomavirus (HPV).  If you had a hysterectomy for a problem that was not  cancer or a condition that could lead to cancer, then you no longer need Pap tests. Even if you no longer need a Pap test, a regular exam is a good idea to make sure no other problems are starting.  If you are between ages 43 and 60, and you have had normal Pap tests going back 10 years, you no longer need Pap tests. Even if you no longer need a Pap test, a regular exam is a good idea to make sure no other problems are starting.  If you have had past treatment for cervical cancer or a condition that could lead to cancer, you need Pap tests and screening for cancer for at least 20 years after your treatment.  If Pap tests have been discontinued, risk factors (such as a new sexual partner) need to be reassessed to determine if screening should be resumed.  The HPV test is an additional test that may be used for cervical cancer screening. The HPV test  looks for the virus that can cause the cell changes on the cervix. The cells collected during the Pap test can be tested for HPV. The HPV test could be used to screen women aged 72 years and older, and should be used in women of any age who have unclear Pap test results. After the age of 69, women should have HPV testing at the same frequency as a Pap test.  Colorectal cancer can be detected and often prevented. Most routine colorectal cancer screening begins at the age of 66 and continues through age 21. However, your caregiver may recommend screening at an earlier age if you have risk factors for colon cancer. On a yearly basis, your caregiver may provide home test kits to check for hidden blood in the stool. Use of a small camera at the end of a tube, to directly examine the colon (sigmoidoscopy or colonoscopy), can detect the earliest forms of colorectal cancer. Talk to your caregiver about this at age 63, when routine screening begins. Direct examination of the colon should be repeated every 5 to 10 years through age 40, unless early forms of pre-cancerous  polyps or small growths are found.  Hepatitis C blood testing is recommended for all people born from [birth year] through 1965 and any individual with known risks for hepatitis C.  Practice safe sex. Use condoms and avoid high-risk sexual practices to reduce the spread of sexually transmitted infections (STIs). STIs include gonorrhea, chlamydia, syphilis, trichomonas, herpes, HPV, and human immunodeficiency virus (HIV). Herpes, HIV, and HPV are viral illnesses that have no cure. They can result in disability, cancer, and death. Sexually active women aged 35 and younger should be checked for chlamydia. Older women with new or multiple partners should also be tested for chlamydia. Testing for other STIs is recommended if you are sexually active and at increased risk.  Osteoporosis is a disease in which the bones lose minerals and strength with aging. This can result in serious bone fractures. The risk of osteoporosis can be identified using a bone density scan. Women ages 63 and over and women at risk for fractures or osteoporosis should discuss screening with their caregivers. Ask your caregiver whether you should take a calcium supplement or vitamin D to reduce the rate of osteoporosis.  Menopause can be associated with physical symptoms and risks. Hormone replacement therapy is available to decrease symptoms and risks. You should talk to your caregiver about whether hormone replacement therapy is right for you.  Use sunscreen with sun protection factor (SPF) of 30 or more. Apply sunscreen liberally and repeatedly throughout the day. You should seek shade when your shadow is shorter than you. Protect yourself by wearing long sleeves, pants, a wide-brimmed hat, and sunglasses year round, whenever you are outdoors.  Once a month, do a whole body skin exam, using a mirror to look at the skin on your back. Notify your caregiver of new moles, moles that have irregular borders, moles that are larger than a pencil  eraser, or moles that have changed in shape or color.  Stay current with required immunizations.  Influenza. You need a dose every fall (or winter). The composition of the flu vaccine changes each year, so being vaccinated once is not enough.  Pneumococcal polysaccharide. You need 1 to 2 doses if you smoke cigarettes or if you have certain chronic medical conditions. You need 1 dose at age 11 (or older) if you have never been vaccinated.  Tetanus, diphtheria, pertussis (Tdap, Td). Get 1  dose of Tdap vaccine if you are younger than age 54, are over 84 and have contact with an infant, are a Research scientist (physical sciences), are pregnant, or simply want to be protected from whooping cough. After that, you need a Td booster dose every 10 years. Consult your caregiver if you have not had at least 3 tetanus and diphtheria-containing shots sometime in your life or have a deep or dirty wound.  HPV. You need this vaccine if you are a woman age 36 or younger. The vaccine is given in 3 doses over 6 months.  Measles, mumps, rubella (MMR). You need at least 1 dose of MMR if you were born in 1957 or later. You may also need a second dose.  Meningococcal. If you are age 72 to 50 and a first-year college student living in a residence hall, or have one of several medical conditions, you need to get vaccinated against meningococcal disease. You may also need additional booster doses.  Zoster (shingles). If you are age 16 or older, you should get this vaccine.  Varicella (chickenpox). If you have never had chickenpox or you were vaccinated but received only 1 dose, talk to your caregiver to find out if you need this vaccine.  Hepatitis A. You need this vaccine if you have a specific risk factor for hepatitis A virus infection or you simply wish to be protected from this disease. The vaccine is usually given as 2 doses, 6 to 18 months apart.  Hepatitis B. You need this vaccine if you have a specific risk factor for hepatitis B  virus infection or you simply wish to be protected from this disease. The vaccine is given in 3 doses, usually over 6 months.

## 2012-12-10 ENCOUNTER — Other Ambulatory Visit: Payer: Self-pay | Admitting: Family Medicine

## 2012-12-10 MED ORDER — DULOXETINE HCL 60 MG PO CPEP: 60.0000 mg | ORAL_CAPSULE | Freq: Every day | ORAL | Status: DC

## 2013-02-07 ENCOUNTER — Other Ambulatory Visit: Payer: 59

## 2013-02-14 ENCOUNTER — Ambulatory Visit: Payer: 59 | Admitting: Internal Medicine

## 2013-02-26 ENCOUNTER — Other Ambulatory Visit: Payer: 59

## 2013-03-05 ENCOUNTER — Ambulatory Visit: Payer: 59 | Admitting: Internal Medicine

## 2013-03-12 ENCOUNTER — Other Ambulatory Visit (INDEPENDENT_AMBULATORY_CARE_PROVIDER_SITE_OTHER): Payer: 59

## 2013-03-12 DIAGNOSIS — R7309 Other abnormal glucose: Secondary | ICD-10-CM

## 2013-03-12 DIAGNOSIS — E039 Hypothyroidism, unspecified: Secondary | ICD-10-CM

## 2013-03-12 DIAGNOSIS — R739 Hyperglycemia, unspecified: Secondary | ICD-10-CM

## 2013-03-12 LAB — T4, FREE: Free T4: 0.74 ng/dL (ref 0.60–1.60)

## 2013-03-12 LAB — BASIC METABOLIC PANEL
BUN: 14 mg/dL (ref 6–23)
Calcium: 9.2 mg/dL (ref 8.4–10.5)
GFR: 74.57 mL/min (ref >60.00)
Glucose, Bld: 113 mg/dL — ABNORMAL HIGH (ref 70–99)
Potassium: 4.3 mEq/L (ref 3.5–5.1)

## 2013-03-18 ENCOUNTER — Ambulatory Visit: Payer: 59 | Admitting: Internal Medicine

## 2013-03-25 ENCOUNTER — Encounter: Payer: Self-pay | Admitting: Internal Medicine

## 2013-03-25 ENCOUNTER — Ambulatory Visit (INDEPENDENT_AMBULATORY_CARE_PROVIDER_SITE_OTHER): Payer: 59 | Admitting: Internal Medicine

## 2013-03-25 VITALS — BP 116/84 | HR 79 | Temp 98.1°F | Wt 212.0 lb

## 2013-03-25 DIAGNOSIS — R7309 Other abnormal glucose: Secondary | ICD-10-CM

## 2013-03-25 NOTE — Progress Notes (Signed)
Chief Complaint  Patient presents with  . Follow-up    lab    HPI: Fu sugars  . Trying to eat better considering jointing weight watchers .   Has 36 yo at home  . No new sx or problems  Plans on another pregnancy if possible  ROS:  No cv change in health  Past Medical History  Diagnosis Date  . Allergy   . Headache(784.0)   . Female infertility   . Allergic rhinitis due to dust     Family History  Problem Relation Age of Onset  . Depression Father   . Depression Mother   . Breast cancer      maternal great aunt  . Breast cancer Paternal Grandmother   . ALS Father     multiple system atropy    History   Social History  . Marital Status: Married    Spouse Name: N/A    Number of Children: 1  . Years of Education: N/A   Occupational History  .     Social History Main Topics  . Smoking status: Never Smoker   . Smokeless tobacco: Never Used  . Alcohol Use: No     Comment: occasionally  . Drug Use: No  . Sexually Active: None   Other Topics Concern  . 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    Outpatient Encounter Prescriptions as of 03/25/2013  Medication Sig Dispense Refill  . buPROPion (WELLBUTRIN XL) 300 MG 24 hr tablet Take 1 tablet (300 mg total) by mouth every morning.  90 tablet  3  . cetirizine (ZYRTEC) 10 MG tablet Take 10 mg by mouth daily.        . DULoxetine (CYMBALTA) 60 MG capsule Take 1 capsule (60 mg total) by mouth daily.  90 capsule  1  . ibuprofen (ADVIL,MOTRIN) 600 MG tablet Take 600 mg by mouth every 6 (six) hours as needed.        Marland Kitchen LORazepam (ATIVAN) 0.5 MG tablet Take 0.5 mg by mouth every 8 (eight) hours. 1 tab bid as needed for anxiety       . SUMAtriptan (IMITREX) 100 MG tablet Take 1 tablet (100 mg total) by mouth every 2 (two) hours as needed for migraine.  27 tablet  3  . triamcinolone cream (KENALOG) 0.1 % Apply 1  application topically 2 (two) times daily.       No facility-administered encounter medications on file as of 03/25/2013.    EXAM:  BP 116/84  Pulse 79  Temp(Src) 98.1 F (36.7 C) (Oral)  Wt 212 lb (96.163 kg)  BMI 32.47 kg/m2  SpO2 99%  Body mass index is 32.47 kg/(m^2).  GENERAL: vitals reviewed and listed above, alert, oriented, appears well hydrated and in no acute distress  HEENT: atraumatic, conjunctivaPSYCH: pleasant and cooperative, no obvious depression or anxiety Lab Results  Component Value Date   WBC 6.4 11/06/2012   HGB 13.9 11/06/2012   HCT 41.1 11/06/2012   PLT 309.0 11/06/2012   GLUCOSE 113* 03/12/2013   CHOL 235* 11/06/2012   TRIG 131.0 11/06/2012   HDL 43.90 11/06/2012   LDLDIRECT 158.0 11/06/2012   LDLCALC 118* 08/31/2009   ALT 26 11/06/2012   AST 19 11/06/2012   NA 136 03/12/2013   K 4.3 03/12/2013  CL 101 03/12/2013   CREATININE 0.9 03/12/2013   BUN 14 03/12/2013   CO2 25 03/12/2013   TSH 3.43 03/12/2013   HGBA1C 6.0 03/12/2013   MICROALBUR 1.1 09/15/2010    ASSESSMENT AND PLAN:  Discussed the following assessment and plan:  HYPERGLYCEMIA - stable at risk to develop dm  bg in control cw pre diabetes   Continue lifestyle intervention healthy eating and exercise . Agree with  weightloss   Pre conceptually    Recheck 4-6 months to help with accountability and monitoring per patient -Patient advised to return or notify health care team  if symptoms worsen or persist or new concerns arise.  Patient Instructions  Agree with  Joining weight watchers  Or such.   Plan   Repeat 4-6 months   And then rov      Vertis Bauder K. Amandy Chubbuck M.D.

## 2013-03-25 NOTE — Patient Instructions (Signed)
Agree with  Joining weight watchers  Or such.   Plan   Repeat 4-6 months   And then rov

## 2013-05-15 ENCOUNTER — Other Ambulatory Visit: Payer: Self-pay | Admitting: Family Medicine

## 2013-05-15 MED ORDER — DULOXETINE HCL 60 MG PO CPEP: 60.0000 mg | ORAL_CAPSULE | Freq: Every day | ORAL | Status: DC

## 2013-07-02 LAB — OB RESULTS CONSOLE RPR: RPR: NONREACTIVE

## 2013-07-02 LAB — OB RESULTS CONSOLE HEPATITIS B SURFACE ANTIGEN: Hepatitis B Surface Ag: NEGATIVE

## 2013-07-02 LAB — OB RESULTS CONSOLE RUBELLA ANTIBODY, IGM: Rubella: IMMUNE

## 2013-07-02 LAB — OB RESULTS CONSOLE ABO/RH: RH TYPE: POSITIVE

## 2013-07-02 LAB — OB RESULTS CONSOLE GC/CHLAMYDIA
CHLAMYDIA, DNA PROBE: NEGATIVE
GC PROBE AMP, GENITAL: NEGATIVE

## 2013-07-02 LAB — OB RESULTS CONSOLE HGB/HCT, BLOOD
HCT: 40 %
HEMOGLOBIN: 13.4 g/dL

## 2013-07-02 LAB — OB RESULTS CONSOLE ANTIBODY SCREEN: Antibody Screen: NEGATIVE

## 2013-07-02 LAB — OB RESULTS CONSOLE HIV ANTIBODY (ROUTINE TESTING): HIV: NONREACTIVE

## 2013-07-26 ENCOUNTER — Encounter: Payer: Self-pay | Admitting: Internal Medicine

## 2013-08-20 ENCOUNTER — Other Ambulatory Visit (INDEPENDENT_AMBULATORY_CARE_PROVIDER_SITE_OTHER): Payer: 59

## 2013-08-20 DIAGNOSIS — R7309 Other abnormal glucose: Secondary | ICD-10-CM

## 2013-08-20 LAB — POCT CBG (FASTING - GLUCOSE)-MANUAL ENTRY: Glucose Fasting, POC: 124 mg/dL — AB (ref 70–99)

## 2013-08-26 ENCOUNTER — Encounter: Payer: 59 | Admitting: Internal Medicine

## 2013-08-26 ENCOUNTER — Encounter: Payer: Self-pay | Admitting: Internal Medicine

## 2013-08-26 DIAGNOSIS — Z0289 Encounter for other administrative examinations: Secondary | ICD-10-CM

## 2013-08-26 NOTE — Progress Notes (Signed)
Document opened and reviewed for OV but appt  canceled same day .  

## 2013-09-04 ENCOUNTER — Other Ambulatory Visit: Payer: Self-pay | Admitting: Internal Medicine

## 2013-11-22 ENCOUNTER — Other Ambulatory Visit: Payer: Self-pay | Admitting: Internal Medicine

## 2013-11-29 ENCOUNTER — Other Ambulatory Visit: Payer: Self-pay | Admitting: Internal Medicine

## 2013-12-02 NOTE — Telephone Encounter (Signed)
Ok to refill x 90 days  Tell her she needs   Fu lab and ov

## 2013-12-02 NOTE — Telephone Encounter (Signed)
Has been more than a year since her last CPE.  Seen last in June 2014.  Please advise.  Thanks!

## 2013-12-11 ENCOUNTER — Telehealth: Payer: Self-pay | Admitting: Internal Medicine

## 2013-12-11 NOTE — Telephone Encounter (Signed)
Ok to use this  And try to see her early at her daughters app t if possible

## 2013-12-11 NOTE — Telephone Encounter (Signed)
Pt's daughter has appt on tues 3/3 at 8:45.  Mom needs fup on her meds and wants to come in at the same time. Only thing available is a SD at 9:45. Is it OK to use?

## 2013-12-11 NOTE — Telephone Encounter (Signed)
done

## 2013-12-17 ENCOUNTER — Encounter: Payer: Self-pay | Admitting: Internal Medicine

## 2013-12-17 ENCOUNTER — Ambulatory Visit (INDEPENDENT_AMBULATORY_CARE_PROVIDER_SITE_OTHER): Payer: 59 | Admitting: Internal Medicine

## 2013-12-17 VITALS — BP 116/70 | Temp 98.5°F | Ht 67.5 in | Wt 223.0 lb

## 2013-12-17 DIAGNOSIS — Z331 Pregnant state, incidental: Secondary | ICD-10-CM

## 2013-12-17 DIAGNOSIS — R7309 Other abnormal glucose: Secondary | ICD-10-CM

## 2013-12-17 DIAGNOSIS — F4321 Adjustment disorder with depressed mood: Secondary | ICD-10-CM

## 2013-12-17 DIAGNOSIS — Z79899 Other long term (current) drug therapy: Secondary | ICD-10-CM

## 2013-12-17 MED ORDER — BUPROPION HCL ER (XL) 300 MG PO TB24: ORAL_TABLET | ORAL | Status: DC

## 2013-12-17 NOTE — Progress Notes (Signed)
Chief Complaint  Patient presents with  . Follow-up    Pt is [redacted] wks pregnant    HPI: FU of medication here with 37 yo caughter [facility] Needs followup with medication evaluation refill is continued on the Cymbalta and Wellbutrin because does better on it and SOB at her decided benefit more than risk. She is now on as needed Ambien also 30 weeks pregnancy sugar  Tests was ok so far.  May 14th  Edc. Monthly .  Bp pressure has been fine.   Grandfather died and to go to wedding and funeral. Slightly down about this but actually has been doing quite well -year-old daughter seems to be doing well also. ROS: See pertinent positives and negatives per HPI. She is up-to-date on flu vaccine and Pap smear. Past Medical History  Diagnosis Date  . Allergy   . Headache(784.0)   . Female infertility   . Allergic rhinitis due to dust     Family History  Problem Relation Age of Onset  . Depression Father   . Depression Mother   . Breast cancer      maternal great aunt  . Breast cancer Paternal Grandmother   . ALS Father     multiple system atropy    History   Social History  . Marital Status: Married    Spouse Name: N/A    Number of Children: 1  . Years of Education: N/A   Occupational History  .     Social History Main Topics  . Smoking status: Never Smoker   . Smokeless tobacco: Never Used  . Alcohol Use: No     Comment: occasionally  . Drug Use: No  . Sexual Activity: None   Other Topics Concern  . 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    Outpatient Encounter Prescriptions as of 12/17/2013  Medication Sig  . buPROPion (WELLBUTRIN XL) 300 MG 24 hr tablet take 1 tablet by mouth every morning  . cetirizine (ZYRTEC) 10 MG tablet Take 10 mg by mouth daily.    . DULoxetine (CYMBALTA) 60 MG capsule Take 1 capsule by mouth  daily  . triamcinolone cream (KENALOG) 0.1  % Apply 1 application topically 2 (two) times daily.  Marland Kitchen. zolpidem (AMBIEN) 10 MG tablet   . [DISCONTINUED] buPROPion (WELLBUTRIN XL) 300 MG 24 hr tablet take 1 tablet by mouth every morning  . [DISCONTINUED] ibuprofen (ADVIL,MOTRIN) 600 MG tablet Take 600 mg by mouth every 6 (six) hours as needed.    . [DISCONTINUED] LORazepam (ATIVAN) 0.5 MG tablet Take 0.5 mg by mouth every 8 (eight) hours. 1 tab bid as needed for anxiety   . [DISCONTINUED] SUMAtriptan (IMITREX) 100 MG tablet Take 1 tablet (100 mg total) by mouth every 2 (two) hours as needed for migraine.    EXAM:  BP 116/70  Temp(Src) 98.5 F (36.9 C) (Oral)  Ht 5' 7.5" (1.715 m)  Wt 223 lb (101.152 kg)  BMI 34.39 kg/m2  Body mass index is 34.39 kg/(m^2).  GENERAL: vitals reviewed and listed above, alert, oriented, appears well hydrated and in no acute distress  HEENT: atraumatic, conjunctiva  clear, no obvious abnormalities on inspection of external nose and ears MS: moves all extremities without noticeable focal  abnormality PSYCH: pleasant and cooperative, no obvious depression or  anxiety see screen ?s  Lab Results  Component Value Date   WBC 6.4 11/06/2012   HGB 13.9 11/06/2012   HCT 41.1 11/06/2012   PLT 309.0 11/06/2012   GLUCOSE 113* 03/12/2013   CHOL 235* 11/06/2012   TRIG 131.0 11/06/2012   HDL 43.90 11/06/2012   LDLDIRECT 158.0 11/06/2012   LDLCALC 118* 08/31/2009   ALT 26 11/06/2012   AST 19 11/06/2012   NA 136 03/12/2013   K 4.3 03/12/2013   CL 101 03/12/2013   CREATININE 0.9 03/12/2013   BUN 14 03/12/2013   CO2 25 03/12/2013   TSH 3.43 03/12/2013   HGBA1C 5.8 08/20/2013   MICROALBUR 1.1 09/15/2010    ASSESSMENT AND PLAN:  Discussed the following assessment and plan:  Adjustment disorder with depressed mood - seems stable reamiaing on meds in pregnancy  Medication management  HYPERGLYCEMIA  Pregnant state, incidental - edc in MAy 15 Refill in the Wellbutrin today pharmacy can contact us for further refills  followup after delivery or at her next well child check as needed. -Patient advised to return or notify health care team  if symptoms worsen or persist or new concerns arise.  Patient Instructions  Ok to refill medication. At this time. Continue lifestyle intervention healthy eating and exercise .  ROV 6 months or as needed.     Neta Mends. Panosh M.D.   Pre visit review using our clinic review tool, if applicable. No additional management support is needed unless otherwise documented below in the visit note.

## 2013-12-17 NOTE — Patient Instructions (Addendum)
Ok to refill medication. At this time. Continue lifestyle intervention healthy eating and exercise .  ROV 6 months or as needed.

## 2014-01-29 ENCOUNTER — Inpatient Hospital Stay (HOSPITAL_COMMUNITY)
Admission: AD | Admit: 2014-01-29 | Discharge: 2014-02-03 | DRG: 765 | Disposition: A | Discharge status: Home / Self Care | Payer: 59 | Source: Ambulatory Visit | Attending: Obstetrics and Gynecology | Admitting: Obstetrics and Gynecology

## 2014-01-29 ENCOUNTER — Encounter (HOSPITAL_COMMUNITY): Payer: Self-pay | Admitting: *Deleted

## 2014-01-29 DIAGNOSIS — F329 Major depressive disorder, single episode, unspecified: Secondary | ICD-10-CM | POA: Diagnosis present

## 2014-01-29 DIAGNOSIS — O321XX0 Maternal care for breech presentation, not applicable or unspecified: Secondary | ICD-10-CM

## 2014-01-29 DIAGNOSIS — O99214 Obesity complicating childbirth: Secondary | ICD-10-CM

## 2014-01-29 DIAGNOSIS — F3289 Other specified depressive episodes: Secondary | ICD-10-CM | POA: Diagnosis present

## 2014-01-29 DIAGNOSIS — O9903 Anemia complicating the puerperium: Secondary | ICD-10-CM | POA: Diagnosis not present

## 2014-01-29 DIAGNOSIS — O99344 Other mental disorders complicating childbirth: Secondary | ICD-10-CM | POA: Diagnosis present

## 2014-01-29 DIAGNOSIS — D649 Anemia, unspecified: Secondary | ICD-10-CM | POA: Diagnosis not present

## 2014-01-29 DIAGNOSIS — O09519 Supervision of elderly primigravida, unspecified trimester: Secondary | ICD-10-CM | POA: Diagnosis present

## 2014-01-29 DIAGNOSIS — O1490 Unspecified pre-eclampsia, unspecified trimester: Secondary | ICD-10-CM | POA: Diagnosis present

## 2014-01-29 DIAGNOSIS — E669 Obesity, unspecified: Secondary | ICD-10-CM | POA: Diagnosis present

## 2014-01-29 DIAGNOSIS — F32A Depression, unspecified: Secondary | ICD-10-CM | POA: Diagnosis present

## 2014-01-29 DIAGNOSIS — Z331 Pregnant state, incidental: Secondary | ICD-10-CM

## 2014-01-29 DIAGNOSIS — O139 Gestational [pregnancy-induced] hypertension without significant proteinuria, unspecified trimester: Secondary | ICD-10-CM | POA: Diagnosis present

## 2014-01-29 DIAGNOSIS — Z6837 Body mass index (BMI) 37.0-37.9, adult: Secondary | ICD-10-CM

## 2014-01-29 DIAGNOSIS — N838 Other noninflammatory disorders of ovary, fallopian tube and broad ligament: Secondary | ICD-10-CM | POA: Diagnosis present

## 2014-01-29 DIAGNOSIS — O1414 Severe pre-eclampsia complicating childbirth: Principal | ICD-10-CM | POA: Diagnosis present

## 2014-01-29 DIAGNOSIS — O34599 Maternal care for other abnormalities of gravid uterus, unspecified trimester: Secondary | ICD-10-CM | POA: Diagnosis present

## 2014-01-29 DIAGNOSIS — Z98891 History of uterine scar from previous surgery: Secondary | ICD-10-CM

## 2014-01-29 DIAGNOSIS — G43909 Migraine, unspecified, not intractable, without status migrainosus: Secondary | ICD-10-CM | POA: Diagnosis present

## 2014-01-29 DIAGNOSIS — O24419 Gestational diabetes mellitus in pregnancy, unspecified control: Secondary | ICD-10-CM | POA: Diagnosis present

## 2014-01-29 DIAGNOSIS — IMO0002 Reserved for concepts with insufficient information to code with codable children: Secondary | ICD-10-CM | POA: Diagnosis present

## 2014-01-29 HISTORY — DX: Unspecified pre-eclampsia, unspecified trimester: O14.90

## 2014-01-29 HISTORY — DX: Maternal care for breech presentation, not applicable or unspecified: O32.1XX0

## 2014-01-29 HISTORY — DX: Gestational (pregnancy-induced) hypertension without significant proteinuria, unspecified trimester: O13.9

## 2014-01-29 LAB — COMPREHENSIVE METABOLIC PANEL
ALBUMIN: 2 g/dL — AB (ref 3.5–5.2)
ALK PHOS: 92 U/L (ref 39–117)
ALT: 14 U/L (ref 0–35)
AST: 19 U/L (ref 0–37)
BUN: 11 mg/dL (ref 6–23)
CO2: 21 mEq/L (ref 19–32)
Calcium: 8.4 mg/dL (ref 8.4–10.5)
Chloride: 102 mEq/L (ref 96–112)
Creatinine, Ser: 0.86 mg/dL (ref 0.50–1.10)
GFR calc non Af Amer: 86 mL/min — ABNORMAL LOW (ref >90)
Glucose, Bld: 101 mg/dL — ABNORMAL HIGH (ref 70–99)
POTASSIUM: 4.5 meq/L (ref 3.7–5.3)
Sodium: 138 mEq/L (ref 137–147)
Total Bilirubin: <0.2 mg/dL — ABNORMAL LOW (ref 0.3–1.2)
Total Protein: 5.7 g/dL — ABNORMAL LOW (ref 6.0–8.3)

## 2014-01-29 LAB — CBC
HCT: 35.1 % — ABNORMAL LOW (ref 36.0–46.0)
HEMOGLOBIN: 11.9 g/dL — AB (ref 12.0–15.0)
MCH: 26.9 pg (ref 26.0–34.0)
MCHC: 33.9 g/dL (ref 30.0–36.0)
MCV: 79.4 fL (ref 78.0–100.0)
Platelets: 249 10*3/uL (ref 150–400)
RBC: 4.42 MIL/uL (ref 3.87–5.11)
RDW: 13.3 % (ref 11.5–15.5)
WBC: 9.5 10*3/uL (ref 4.0–10.5)

## 2014-01-29 LAB — URIC ACID: Uric Acid, Serum: 6.6 mg/dL (ref 2.4–7.0)

## 2014-01-29 LAB — LACTATE DEHYDROGENASE: LDH: 214 U/L (ref 94–250)

## 2014-01-29 MED ORDER — DOCUSATE SODIUM 100 MG PO CAPS
100.0000 mg | ORAL_CAPSULE | Freq: Every day | ORAL | Status: DC
  Administered 2014-01-30 – 2014-02-03 (×5): 100 mg via ORAL
  Filled 2014-01-29 (×5): qty 1

## 2014-01-29 MED ORDER — ACETAMINOPHEN 325 MG PO TABS
650.0000 mg | ORAL_TABLET | ORAL | Status: DC | PRN
  Administered 2014-01-30: 650 mg via ORAL
  Filled 2014-01-29: qty 2

## 2014-01-29 MED ORDER — ZOLPIDEM TARTRATE 5 MG PO TABS
5.0000 mg | ORAL_TABLET | Freq: Every evening | ORAL | Status: DC | PRN
  Administered 2014-01-30: 5 mg via ORAL
  Filled 2014-01-29: qty 1

## 2014-01-29 MED ORDER — CALCIUM CARBONATE ANTACID 500 MG PO CHEW: 2.0000 | CHEWABLE_TABLET | ORAL | Status: DC | PRN

## 2014-01-29 MED ORDER — PRENATAL MULTIVITAMIN CH
1.0000 | ORAL_TABLET | Freq: Every day | ORAL | Status: DC
  Administered 2014-01-30 – 2014-01-31 (×2): 1 via ORAL
  Filled 2014-01-29 (×2): qty 1

## 2014-01-29 MED ORDER — LABETALOL HCL 100 MG PO TABS
100.0000 mg | ORAL_TABLET | Freq: Two times a day (BID) | ORAL | Status: DC
  Administered 2014-01-30 – 2014-01-31 (×4): 100 mg via ORAL
  Filled 2014-01-29 (×6): qty 1

## 2014-01-29 MED ORDER — LACTATED RINGERS IV SOLN
INTRAVENOUS | Status: DC
  Administered 2014-01-29 – 2014-01-31 (×5): via INTRAVENOUS

## 2014-01-29 NOTE — Plan of Care (Signed)
Problem: Consults Goal: Birthing Suites Patient Information Press F2 to bring up selections list   Pt < [redacted] weeks EGA     

## 2014-01-29 NOTE — H&P (Signed)
Sandra Hudson is a 38 y.o. female, seen in the office with elevated BPs, labs were drawn and PCR came back at 3.51 and pt [facility].  Currently pt denies HA, visual changes, or right epigastric pain.  Denies VB, UCs, LOF, recent fever, resp or GI c/o's, UTI s/s. GFM.  U/s today in the office - Breech, posterior placenta, AFI normal, EFW 2973g  Patient Active Problem List   Diagnosis Date Noted  . Pre-eclampsia 01/29/2014  . Obesity, unspecified 01/29/2014  . Depressive disorder, not elsewhere classified 01/29/2014  . Breech presentation 01/29/2014  . Advanced maternal age (AMA) in pregnancy 01/29/2014  . Pregnant state, incidental 12/17/2013  . Encounter for preventive health examination 11/07/2011  . Rash 11/07/2011  . H/o PIH (pregnancy induced hypertension) 12/19/2010  . H/o Gestational diabetes mellitus 12/19/2010  . PROTEINURIA 09/15/2010  . INFERTILITY, ANOVULATORY 09/14/2009  . HYPERGLYCEMIA 07/16/2008  . ACNE VULGARIS, FACIAL 11/06/2007  . DISORDER, ADJUSTMENT W/DEPRESSED MOOD 08/14/2007  . SLEEPLESSNESS 08/14/2007  . HEADACHE 07/11/2007  . RECTAL BLEEDING, HX OF 07/11/2007  . HYPERLIPIDEMIA NEC/NOS 05/08/2007  . MIGRAINE HEADACHE 02/27/2007  . ALLERGIC RHINITIS 02/27/2007    History of present pregnancy: Patient entered care at 10 weeks.   EDC of 02/27/14 was established by LMP.   Anatomy scan:  18 weeks, limited with normal findings and an posterior placenta. Anatomy scan completed at 21 weeks.   Additional US evaluations:  6259w6d for growth S>D - SIUP, breech, posterior placenta, AFI 70th%ile.   Significant prenatal events:  Sleeping issues   Last evaluation:  01/29/14 at 1759w6d   1cm / 40% / -2  OB History   Grav Para Term Preterm Abortions TAB SAB Ect Mult Living   2 1 1             Past Medical History  Diagnosis Date  . Allergy   . Headache(784.0)   . Female infertility   . Allergic rhinitis due to dust   . Pregnancy induced  hypertension    Past Surgical History  Procedure Laterality Date  . Nasal septum surgery      deviated septum and large turbinate   Family History: family history includes ALS in her father; Breast cancer in her paternal grandmother and another family member; Depression in her father and mother. Social History:  reports that she has never smoked. She has never used smokeless tobacco. She reports that she does not drink alcohol or use illicit drugs.   Prenatal Transfer Tool  Maternal Diabetes: No Genetic Screening: Normal Maternal Ultrasounds/Referrals: Normal Fetal Ultrasounds or other Referrals:  None Maternal Substance Abuse:  No Significant Maternal Medications:  Meds include: Other:  Significant Maternal Lab Results: None    ROS: see HPI above, all other systems are negative   No Known Allergies     Blood pressure 161/95, pulse 100, temperature 98.1 F (36.7 C), temperature source Oral, resp. rate 20, height 5\' 8"  (1.727 m), weight 245 lb (111.131 kg), last menstrual period 05/23/2013.  Chest clear Heart RRR without murmur Abd gravid, NT Ext: WNL  FHR: Cat I UCs:  Irregular mild  Prenatal labs: ABO, Rh:  A Pos Antibody:  Neg Rubella:   Immune RPR:   Neg HBsAg:   neg HIV:   Neg GBS:  Pending Sickle cell/Hgb electrophoresis:  n/a Pap:  08/01/13 WNL GC:  Neg Chlamydia:  Neg Genetic screenings:  Harmony normal Glucola:  119 Other:  PIH labs 01/29/14 - Plt  247, AST 16, ALT 12, Uric acid 6.1, LDH 199, urine creatinine 477.8, PCR 3.51    Assessment IUP at 762w6d Pre-eclampsia  [facility] Labetalol 100 mg BID MFM consult in the am    Gray BernhardtJennifer OxleyCNM, MSN 01/29/2014, 10:52 PM

## 2014-01-30 ENCOUNTER — Encounter (HOSPITAL_COMMUNITY): Payer: Self-pay | Admitting: Obstetrics and Gynecology

## 2014-01-30 LAB — PROTEIN / CREATININE RATIO, URINE
Creatinine, Urine: 220.08 mg/dL
Protein Creatinine Ratio: 3.48 — ABNORMAL HIGH (ref 0.00–0.15)
Total Protein, Urine: 765 mg/dL

## 2014-01-30 LAB — TYPE AND SCREEN
ABO/RH(D): A POS
Antibody Screen: NEGATIVE

## 2014-01-30 LAB — ABO/RH: ABO/RH(D): A POS

## 2014-01-30 LAB — GROUP B STREP BY PCR: GROUP B STREP BY PCR: NEGATIVE

## 2014-01-30 MED ORDER — BUTALBITAL-APAP-CAFFEINE 50-325-40 MG PO TABS
1.0000 | ORAL_TABLET | Freq: Four times a day (QID) | ORAL | Status: DC | PRN
  Administered 2014-01-30 (×2): 1 via ORAL
  Filled 2014-01-30 (×2): qty 1

## 2014-01-30 MED ORDER — BUPROPION HCL ER (XL) 300 MG PO TB24
300.0000 mg | ORAL_TABLET | Freq: Every day | ORAL | Status: DC
  Administered 2014-01-30 – 2014-02-03 (×5): 300 mg via ORAL
  Filled 2014-01-30 (×6): qty 1

## 2014-01-30 MED ORDER — DULOXETINE HCL 60 MG PO CPEP
60.0000 mg | ORAL_CAPSULE | Freq: Every day | ORAL | Status: DC
  Administered 2014-01-30 – 2014-01-31 (×2): 60 mg via ORAL
  Filled 2014-01-30 (×3): qty 1

## 2014-01-30 MED ORDER — LORATADINE 10 MG PO TABS
10.0000 mg | ORAL_TABLET | Freq: Every day | ORAL | Status: DC
  Administered 2014-01-30 – 2014-01-31 (×2): 10 mg via ORAL
  Filled 2014-01-30 (×3): qty 1

## 2014-01-30 NOTE — Progress Notes (Addendum)
Hospital day # 1 pregnancy at 1038w0d--Pre-eclampsia, breech presentation.  S:  Slept poorly--awaiting MFM consult.  Mild HA, denies visual sx or epigastric   pain.  "This is same thing that happened last pregnancy".      Perception of contractions: None      Vaginal bleeding: None       Vaginal discharge:  None  O: BP 146/88  Pulse 98  Temp(Src) 98.1 F (36.7 C) (Oral)  Resp 20  Ht 5\' 8"  (1.727 m)  Wt 245 lb (111.131 kg)  BMI 37.26 kg/m2  LMP 05/23/2013      Fetal tracings:  Category 1      Contractions:   Occasional, mild      Uterus gravid and non-tender      Extremities: DTR 2+, no clonus, 1+ edema in LE.  Filed Vitals:   01/30/14 0203 01/30/14 0303 01/30/14 0403 01/30/14 0505  BP: 126/68 138/77 136/72 146/88  Pulse: 84 87 80 98  Temp:      TempSrc:      Resp:      Height:      Weight:       BP range prior to 10pm Labetalol--142-161/86-101  BP range after Labetalol dose--126-150/68-97           Labs:   Results for orders placed during the hospital encounter of 01/29/14 (from the past 24 hour(s))  COMPREHENSIVE METABOLIC PANEL     Status: Abnormal   Collection Time    01/29/14 10:10 PM      Result Value Ref Range   Sodium 138  137 - 147 mEq/L   Potassium 4.5  3.7 - 5.3 mEq/L   Chloride 102  96 - 112 mEq/L   CO2 21  19 - 32 mEq/L   Glucose, Bld 101 (*) 70 - 99 mg/dL   BUN 11  6 - 23 mg/dL   Creatinine, Ser 1.610.86  0.50 - 1.10 mg/dL   Calcium 8.4  8.4 - 09.610.5 mg/dL   Total Protein 5.7 (*) 6.0 - 8.3 g/dL   Albumin 2.0 (*) 3.5 - 5.2 g/dL   AST 19  0 - 37 U/L   ALT 14  0 - 35 U/L   Alkaline Phosphatase 92  39 - 117 U/L   Total Bilirubin <0.2 (*) 0.3 - 1.2 mg/dL   GFR calc non Af Amer 86 (*) >90 mL/min   GFR calc Af Amer >90  >90 mL/min  LACTATE DEHYDROGENASE     Status: None   Collection Time    01/29/14 10:10 PM      Result Value Ref Range   LDH 214  94 - 250 U/L  URIC ACID     Status: None   Collection Time    01/29/14 10:10 PM      Result Value Ref Range    Uric Acid, Serum 6.6  2.4 - 7.0 mg/dL  CBC     Status: Abnormal   Collection Time    01/29/14 10:10 PM      Result Value Ref Range   WBC 9.5  4.0 - 10.5 K/uL   RBC 4.42  3.87 - 5.11 MIL/uL   Hemoglobin 11.9 (*) 12.0 - 15.0 g/dL   HCT 04.535.1 (*) 40.936.0 - 81.146.0 %   MCV 79.4  78.0 - 100.0 fL   MCH 26.9  26.0 - 34.0 pg   MCHC 33.9  30.0 - 36.0 g/dL   RDW 91.413.3  78.211.5 - 95.615.5 %   Platelets 249  150 - 400 K/uL  TYPE AND SCREEN     Status: None   Collection Time    01/29/14 10:10 PM      Result Value Ref Range   ABO/RH(D) A POS     Antibody Screen NEG     Sample Expiration 02/01/2014    ABO/RH     Status: None   Collection Time    01/29/14 10:10 PM      Result Value Ref Range   ABO/RH(D) A POS    GROUP B STREP BY PCR     Status: None   Collection Time    01/29/14 11:40 PM      Result Value Ref Range   Group B strep by PCR NEGATIVE  NEGATIVE  PROTEIN / CREATININE RATIO, URINE     Status: Abnormal   Collection Time    01/30/14  2:20 AM      Result Value Ref Range   Creatinine, Urine 220.08     Total Protein, Urine 765     PROTEIN CREATININE RATIO 3.48 (*) 0.00 - 0.15   Protein/creatnine ratio from office 3.51  I/O balance:  Not documented since admission       Meds: Labetalol 100 mg po BID--1st dose at 10pm last night                 Cymbalta 60 mg q day                 Welbutrin 300 mg q day                 Loratadine 10 mg q day                 Colace 100 mg po q day  A: 1859w0d with pre-eclampsia, breech presentation     Stable  P: Continue current plan of care      Upcoming tests/treatments:  MFM consult today      Support to patient for dx of pre-eclampsia.  Reviewed hx of pre-eclampsia        with previous pregnancy.      Patient anticipating recommendation from MFM for either delivery this week       or at 37 weeks.      Strict I&O monitoring      MDs will follow  Nigel BridgemanVicki Latham CNM, [place] 01/30/2014 7:56 AM   Will give fioricet for headache. Awaiting MFM consult

## 2014-01-31 ENCOUNTER — Encounter (HOSPITAL_COMMUNITY): Payer: Self-pay | Admitting: Certified Registered"

## 2014-01-31 ENCOUNTER — Encounter (HOSPITAL_COMMUNITY)
Admission: AD | Disposition: A | Discharge status: Home / Self Care | Payer: Self-pay | Source: Ambulatory Visit | Attending: Obstetrics and Gynecology

## 2014-01-31 ENCOUNTER — Inpatient Hospital Stay (HOSPITAL_COMMUNITY): Payer: 59

## 2014-01-31 ENCOUNTER — Encounter (HOSPITAL_COMMUNITY): Payer: 59 | Admitting: Anesthesiology

## 2014-01-31 ENCOUNTER — Inpatient Hospital Stay (HOSPITAL_COMMUNITY): Payer: 59 | Admitting: Anesthesiology

## 2014-01-31 LAB — CBC
HCT: 36.1 % (ref 36.0–46.0)
HEMATOCRIT: 33.2 % — AB (ref 36.0–46.0)
HEMOGLOBIN: 11 g/dL — AB (ref 12.0–15.0)
Hemoglobin: 12 g/dL (ref 12.0–15.0)
MCH: 27.2 pg (ref 26.0–34.0)
MCH: 27 pg (ref 26.0–34.0)
MCHC: 33.2 g/dL (ref 30.0–36.0)
MCHC: 33.1 g/dL (ref 30.0–36.0)
MCV: 81.9 fL (ref 78.0–100.0)
MCV: 81.6 fL (ref 78.0–100.0)
Platelets: 229 10*3/uL (ref 150–400)
Platelets: 213 10*3/uL (ref 150–400)
RBC: 4.41 MIL/uL (ref 3.87–5.11)
RBC: 4.07 MIL/uL (ref 3.87–5.11)
RDW: 12.9 % (ref 11.5–15.5)
RDW: 13 % (ref 11.5–15.5)
WBC: 8.3 10*3/uL (ref 4.0–10.5)
WBC: 13.8 10*3/uL — AB (ref 4.0–10.5)

## 2014-01-31 LAB — COMPREHENSIVE METABOLIC PANEL
ALBUMIN: 1.9 g/dL — AB (ref 3.5–5.2)
ALT: 12 U/L (ref 0–35)
ALT: 11 U/L (ref 0–35)
AST: 16 U/L (ref 0–37)
AST: 20 U/L (ref 0–37)
Albumin: 1.7 g/dL — ABNORMAL LOW (ref 3.5–5.2)
Alkaline Phosphatase: 90 U/L (ref 39–117)
Alkaline Phosphatase: 86 U/L (ref 39–117)
BUN: 8 mg/dL (ref 6–23)
BUN: 9 mg/dL (ref 6–23)
CALCIUM: 7.9 mg/dL — AB (ref 8.4–10.5)
CHLORIDE: 101 meq/L (ref 96–112)
CO2: 23 mEq/L (ref 19–32)
CO2: 25 meq/L (ref 19–32)
CREATININE: 0.75 mg/dL (ref 0.50–1.10)
Calcium: 8.4 mg/dL (ref 8.4–10.5)
Chloride: 99 mEq/L (ref 96–112)
Creatinine, Ser: 0.9 mg/dL (ref 0.50–1.10)
GFR calc Af Amer: >90 mL/min (ref >90)
GFR calc non Af Amer: >90 mL/min (ref >90)
GFR, EST NON AFRICAN AMERICAN: 81 mL/min — AB (ref >90)
GLUCOSE: 107 mg/dL — AB (ref 70–99)
Glucose, Bld: 111 mg/dL — ABNORMAL HIGH (ref 70–99)
Potassium: 4.6 mEq/L (ref 3.7–5.3)
Potassium: 5 mEq/L (ref 3.7–5.3)
SODIUM: 135 meq/L — AB (ref 137–147)
Sodium: 136 mEq/L — ABNORMAL LOW (ref 137–147)
TOTAL PROTEIN: 5 g/dL — AB (ref 6.0–8.3)
Total Bilirubin: <0.2 mg/dL — ABNORMAL LOW (ref 0.3–1.2)
Total Protein: 5.7 g/dL — ABNORMAL LOW (ref 6.0–8.3)

## 2014-01-31 LAB — LACTATE DEHYDROGENASE: LDH: 187 U/L (ref 94–250)

## 2014-01-31 LAB — MAGNESIUM: MAGNESIUM: 4.5 mg/dL — AB (ref 1.5–2.5)

## 2014-01-31 LAB — URIC ACID: URIC ACID, SERUM: 6.4 mg/dL (ref 2.4–7.0)

## 2014-01-31 SURGERY — Surgical Case
Anesthesia: Spinal | Site: Abdomen

## 2014-01-31 MED ORDER — MAGNESIUM SULFATE 40 G IN LACTATED RINGERS - SIMPLE
2.0000 g/h | INTRAVENOUS | Status: DC
  Administered 2014-01-31 – 2014-02-01 (×2): 2 g/h via INTRAVENOUS
  Filled 2014-01-31 (×2): qty 500

## 2014-01-31 MED ORDER — ONDANSETRON HCL 4 MG/2ML IJ SOLN: 4.0000 mg | INTRAMUSCULAR | Status: DC | PRN

## 2014-01-31 MED ORDER — SIMETHICONE 80 MG PO CHEW
80.0000 mg | CHEWABLE_TABLET | Freq: Three times a day (TID) | ORAL | Status: DC
  Administered 2014-02-01 – 2014-02-03 (×7): 80 mg via ORAL
  Filled 2014-01-31 (×8): qty 1

## 2014-01-31 MED ORDER — OXYTOCIN 10 UNIT/ML IJ SOLN
INTRAMUSCULAR | Status: AC
  Filled 2014-01-31: qty 4

## 2014-01-31 MED ORDER — OXYCODONE-ACETAMINOPHEN 5-325 MG PO TABS
1.0000 | ORAL_TABLET | ORAL | Status: DC | PRN
  Administered 2014-02-01 (×3): 2 via ORAL
  Administered 2014-02-02 – 2014-02-03 (×4): 1 via ORAL
  Filled 2014-01-31: qty 1
  Filled 2014-01-31: qty 2
  Filled 2014-01-31: qty 1
  Filled 2014-01-31: qty 2
  Filled 2014-01-31 (×2): qty 1
  Filled 2014-01-31: qty 2

## 2014-01-31 MED ORDER — CEFAZOLIN SODIUM-DEXTROSE 2-3 GM-% IV SOLR
INTRAVENOUS | Status: AC
  Filled 2014-01-31: qty 50

## 2014-01-31 MED ORDER — OXYTOCIN 10 UNIT/ML IJ SOLN
40.0000 [IU] | INTRAVENOUS | Status: DC | PRN
  Administered 2014-01-31: 40 [IU] via INTRAVENOUS

## 2014-01-31 MED ORDER — PHENYLEPHRINE 8 MG IN D5W 100 ML (0.08MG/ML) PREMIX OPTIME
INJECTION | INTRAVENOUS | Status: DC | PRN
  Administered 2014-01-31: 60 ug/min via INTRAVENOUS

## 2014-01-31 MED ORDER — NALOXONE HCL 1 MG/ML IJ SOLN: 1.0000 ug/kg/h | INTRAVENOUS | Status: DC | PRN

## 2014-01-31 MED ORDER — KETOROLAC TROMETHAMINE 30 MG/ML IJ SOLN: 30.0000 mg | Freq: Four times a day (QID) | INTRAMUSCULAR | Status: AC | PRN

## 2014-01-31 MED ORDER — MAGNESIUM SULFATE BOLUS VIA INFUSION
4.0000 g | Freq: Once | INTRAVENOUS | Status: DC
  Filled 2014-01-31: qty 500

## 2014-01-31 MED ORDER — SIMETHICONE 80 MG PO CHEW
80.0000 mg | CHEWABLE_TABLET | ORAL | Status: DC | PRN
Start: 2014-01-31 — End: 2014-02-03

## 2014-01-31 MED ORDER — LANOLIN HYDROUS EX OINT: 1.0000 "application " | TOPICAL_OINTMENT | CUTANEOUS | Status: DC | PRN

## 2014-01-31 MED ORDER — NALBUPHINE HCL 10 MG/ML IJ SOLN: 5.0000 mg | INTRAMUSCULAR | Status: DC | PRN

## 2014-01-31 MED ORDER — ONDANSETRON HCL 4 MG/2ML IJ SOLN
INTRAMUSCULAR | Status: AC
  Filled 2014-01-31: qty 2

## 2014-01-31 MED ORDER — KETOROLAC TROMETHAMINE 30 MG/ML IJ SOLN
30.0000 mg | Freq: Four times a day (QID) | INTRAMUSCULAR | Status: AC | PRN
  Administered 2014-02-01: 30 mg via INTRAVENOUS
  Filled 2014-01-31: qty 1

## 2014-01-31 MED ORDER — EPHEDRINE 5 MG/ML INJ
INTRAVENOUS | Status: AC
  Filled 2014-01-31: qty 10

## 2014-01-31 MED ORDER — LACTATED RINGERS IV SOLN
INTRAVENOUS | Status: DC | PRN
  Administered 2014-01-31: 16:00:00 via INTRAVENOUS

## 2014-01-31 MED ORDER — SCOPOLAMINE 1 MG/3DAYS TD PT72
1.0000 | MEDICATED_PATCH | Freq: Once | TRANSDERMAL | Status: DC
  Administered 2014-01-31: 1.5 mg via TRANSDERMAL

## 2014-01-31 MED ORDER — SODIUM CHLORIDE 0.9 % IJ SOLN
3.0000 mL | Freq: Two times a day (BID) | INTRAMUSCULAR | Status: DC
  Administered 2014-01-31: 3 mL via INTRAVENOUS

## 2014-01-31 MED ORDER — METOCLOPRAMIDE HCL 5 MG/ML IJ SOLN: 10.0000 mg | Freq: Three times a day (TID) | INTRAMUSCULAR | Status: DC | PRN

## 2014-01-31 MED ORDER — ONDANSETRON HCL 4 MG/2ML IJ SOLN
4.0000 mg | Freq: Three times a day (TID) | INTRAMUSCULAR | Status: DC | PRN
Start: 2014-01-31 — End: 2014-02-03

## 2014-01-31 MED ORDER — MORPHINE SULFATE (PF) 0.5 MG/ML IJ SOLN
INTRAMUSCULAR | Status: DC | PRN
  Administered 2014-01-31: .1 mg via INTRATHECAL

## 2014-01-31 MED ORDER — PHENYLEPHRINE HCL 10 MG/ML IJ SOLN
INTRAMUSCULAR | Status: AC
  Filled 2014-01-31: qty 1

## 2014-01-31 MED ORDER — FENTANYL CITRATE 0.05 MG/ML IJ SOLN: 25.0000 ug | INTRAMUSCULAR | Status: DC | PRN

## 2014-01-31 MED ORDER — NALOXONE HCL 0.4 MG/ML IJ SOLN: 0.4000 mg | INTRAMUSCULAR | Status: DC | PRN

## 2014-01-31 MED ORDER — PHENYLEPHRINE 40 MCG/ML (10ML) SYRINGE FOR IV PUSH (FOR BLOOD PRESSURE SUPPORT)
PREFILLED_SYRINGE | INTRAVENOUS | Status: AC
  Filled 2014-01-31: qty 5

## 2014-01-31 MED ORDER — MENTHOL 3 MG MT LOZG: 1.0000 | LOZENGE | OROMUCOSAL | Status: DC | PRN

## 2014-01-31 MED ORDER — FENTANYL CITRATE 0.05 MG/ML IJ SOLN
INTRAMUSCULAR | Status: DC | PRN
  Administered 2014-01-31: 15 ug via INTRATHECAL

## 2014-01-31 MED ORDER — MAGNESIUM SULFATE 40 G IN LACTATED RINGERS - SIMPLE
INTRAVENOUS | Status: DC | PRN
  Administered 2014-01-31: 2 g via INTRAVENOUS

## 2014-01-31 MED ORDER — EPHEDRINE SULFATE 50 MG/ML IJ SOLN
INTRAMUSCULAR | Status: DC | PRN
  Administered 2014-01-31: 5 mg via INTRAVENOUS

## 2014-01-31 MED ORDER — DIPHENHYDRAMINE HCL 50 MG/ML IJ SOLN: 12.5000 mg | INTRAMUSCULAR | Status: DC | PRN

## 2014-01-31 MED ORDER — SENNOSIDES-DOCUSATE SODIUM 8.6-50 MG PO TABS
2.0000 | ORAL_TABLET | ORAL | Status: DC
  Administered 2014-02-02 – 2014-02-03 (×2): 2 via ORAL
  Filled 2014-01-31 (×2): qty 2

## 2014-01-31 MED ORDER — METOCLOPRAMIDE HCL 5 MG/ML IJ SOLN: 10.0000 mg | Freq: Once | INTRAMUSCULAR | Status: DC | PRN

## 2014-01-31 MED ORDER — LACTATED RINGERS IV SOLN
INTRAVENOUS | Status: DC
  Administered 2014-02-01: 01:00:00 via INTRAVENOUS

## 2014-01-31 MED ORDER — SCOPOLAMINE 1 MG/3DAYS TD PT72
MEDICATED_PATCH | TRANSDERMAL | Status: AC
  Filled 2014-01-31: qty 1

## 2014-01-31 MED ORDER — MEPERIDINE HCL 25 MG/ML IJ SOLN: 6.2500 mg | INTRAMUSCULAR | Status: DC | PRN

## 2014-01-31 MED ORDER — PHENYLEPHRINE HCL 10 MG/ML IJ SOLN
INTRAMUSCULAR | Status: DC | PRN
  Administered 2014-01-31: 40 ug via INTRAVENOUS
  Administered 2014-01-31 (×2): 80 ug via INTRAVENOUS

## 2014-01-31 MED ORDER — BUPIVACAINE IN DEXTROSE 0.75-8.25 % IT SOLN
INTRATHECAL | Status: DC | PRN
  Administered 2014-01-31: 1.6 mL via INTRATHECAL

## 2014-01-31 MED ORDER — PRENATAL MULTIVITAMIN CH
1.0000 | ORAL_TABLET | Freq: Every day | ORAL | Status: DC
  Administered 2014-02-01 – 2014-02-03 (×3): 1 via ORAL
  Filled 2014-01-31 (×4): qty 1

## 2014-01-31 MED ORDER — KETOROLAC TROMETHAMINE 60 MG/2ML IM SOLN
60.0000 mg | Freq: Once | INTRAMUSCULAR | Status: AC | PRN
  Administered 2014-01-31: 60 mg via INTRAMUSCULAR

## 2014-01-31 MED ORDER — SIMETHICONE 80 MG PO CHEW
80.0000 mg | CHEWABLE_TABLET | ORAL | Status: DC
  Administered 2014-02-02 – 2014-02-03 (×2): 80 mg via ORAL
  Filled 2014-01-31 (×3): qty 1

## 2014-01-31 MED ORDER — MORPHINE SULFATE 0.5 MG/ML IJ SOLN
INTRAMUSCULAR | Status: AC
  Filled 2014-01-31: qty 10

## 2014-01-31 MED ORDER — IBUPROFEN 600 MG PO TABS
600.0000 mg | ORAL_TABLET | Freq: Four times a day (QID) | ORAL | Status: DC
  Administered 2014-02-01 – 2014-02-03 (×10): 600 mg via ORAL
  Filled 2014-01-31 (×14): qty 1

## 2014-01-31 MED ORDER — CEFAZOLIN SODIUM-DEXTROSE 2-3 GM-% IV SOLR
INTRAVENOUS | Status: DC | PRN
  Administered 2014-01-31: 2 g via INTRAVENOUS

## 2014-01-31 MED ORDER — DIPHENHYDRAMINE HCL 50 MG/ML IJ SOLN: 25.0000 mg | INTRAMUSCULAR | Status: DC | PRN

## 2014-01-31 MED ORDER — OXYTOCIN 40 UNITS IN LACTATED RINGERS INFUSION - SIMPLE MED: 62.5000 mL/h | INTRAVENOUS | Status: AC

## 2014-01-31 MED ORDER — ZOLPIDEM TARTRATE 5 MG PO TABS: 5.0000 mg | ORAL_TABLET | Freq: Every evening | ORAL | Status: DC | PRN

## 2014-01-31 MED ORDER — TETANUS-DIPHTH-ACELL PERTUSSIS 5-2.5-18.5 LF-MCG/0.5 IM SUSP
0.5000 mL | Freq: Once | INTRAMUSCULAR | Status: AC
  Administered 2014-02-01: 0.5 mL via INTRAMUSCULAR
  Filled 2014-01-31: qty 0.5

## 2014-01-31 MED ORDER — FENTANYL CITRATE 0.05 MG/ML IJ SOLN
INTRAMUSCULAR | Status: AC
  Filled 2014-01-31: qty 2

## 2014-01-31 MED ORDER — SODIUM CHLORIDE 0.9 % IJ SOLN: 3.0000 mL | INTRAMUSCULAR | Status: DC | PRN

## 2014-01-31 MED ORDER — 0.9 % SODIUM CHLORIDE (POUR BTL) OPTIME
TOPICAL | Status: DC | PRN
  Administered 2014-01-31: 1000 mL

## 2014-01-31 MED ORDER — WITCH HAZEL-GLYCERIN EX PADS: 1.0000 "application " | MEDICATED_PAD | CUTANEOUS | Status: DC | PRN

## 2014-01-31 MED ORDER — DIPHENHYDRAMINE HCL 25 MG PO CAPS: 25.0000 mg | ORAL_CAPSULE | Freq: Four times a day (QID) | ORAL | Status: DC | PRN

## 2014-01-31 MED ORDER — DIPHENHYDRAMINE HCL 25 MG PO CAPS: 25.0000 mg | ORAL_CAPSULE | ORAL | Status: DC | PRN

## 2014-01-31 MED ORDER — MEPERIDINE HCL 25 MG/ML IJ SOLN
6.2500 mg | INTRAMUSCULAR | Status: DC | PRN
Start: 2014-01-31 — End: 2014-01-31

## 2014-01-31 MED ORDER — KETOROLAC TROMETHAMINE 60 MG/2ML IM SOLN
INTRAMUSCULAR | Status: AC
  Filled 2014-01-31: qty 2

## 2014-01-31 MED ORDER — ONDANSETRON HCL 4 MG PO TABS: 4.0000 mg | ORAL_TABLET | ORAL | Status: DC | PRN

## 2014-01-31 MED ORDER — DIBUCAINE 1 % RE OINT: 1.0000 "application " | TOPICAL_OINTMENT | RECTAL | Status: DC | PRN

## 2014-01-31 SURGICAL SUPPLY — 37 items
BENZOIN TINCTURE PRP APPL 2/3 (GAUZE/BANDAGES/DRESSINGS) ×2 IMPLANT
CLAMP CORD UMBIL (MISCELLANEOUS) IMPLANT
CLOTH BEACON ORANGE TIMEOUT ST (SAFETY) ×2 IMPLANT
CONTAINER PREFILL 10% NBF 15ML (MISCELLANEOUS) IMPLANT
DERMABOND ADVANCED (GAUZE/BANDAGES/DRESSINGS) ×1
DERMABOND ADVANCED .7 DNX12 (GAUZE/BANDAGES/DRESSINGS) ×1 IMPLANT
DRAPE LG THREE QUARTER DISP (DRAPES) IMPLANT
DRSG OPSITE POSTOP 4X10 (GAUZE/BANDAGES/DRESSINGS) ×2 IMPLANT
DURAPREP 26ML APPLICATOR (WOUND CARE) ×2 IMPLANT
ELECT REM PT RETURN 9FT ADLT (ELECTROSURGICAL) ×2
ELECTRODE REM PT RTRN 9FT ADLT (ELECTROSURGICAL) ×1 IMPLANT
EXTRACTOR VACUUM M CUP 4 TUBE (SUCTIONS) IMPLANT
GLOVE BIO SURGEON STRL SZ7.5 (GLOVE) ×2 IMPLANT
GLOVE BIOGEL PI IND STRL 7.5 (GLOVE) ×1 IMPLANT
GLOVE BIOGEL PI INDICATOR 7.5 (GLOVE) ×1
GOWN STRL REUS W/TWL LRG LVL3 (GOWN DISPOSABLE) ×4 IMPLANT
HEMOSTAT SURGICEL 2X3 (HEMOSTASIS) ×2 IMPLANT
KIT ABG SYR 3ML LUER SLIP (SYRINGE) IMPLANT
NEEDLE HYPO 25X5/8 SAFETYGLIDE (NEEDLE) IMPLANT
NS IRRIG 1000ML POUR BTL (IV SOLUTION) ×2 IMPLANT
PACK C SECTION WH (CUSTOM PROCEDURE TRAY) ×2 IMPLANT
PAD OB MATERNITY 4.3X12.25 (PERSONAL CARE ITEMS) ×2 IMPLANT
RETRACTOR WND ALEXIS 25 LRG (MISCELLANEOUS) ×1 IMPLANT
RTRCTR WOUND ALEXIS 25CM LRG (MISCELLANEOUS) ×2
STRIP CLOSURE SKIN 1/2X4 (GAUZE/BANDAGES/DRESSINGS) ×2 IMPLANT
SUT CHROMIC 2 0 CT 1 (SUTURE) ×2 IMPLANT
SUT MNCRL AB 3-0 PS2 27 (SUTURE) ×2 IMPLANT
SUT PLAIN 0 NONE (SUTURE) IMPLANT
SUT PLAIN 2 0 XLH (SUTURE) ×2 IMPLANT
SUT VIC AB 0 CT1 36 (SUTURE) ×2 IMPLANT
SUT VIC AB 0 CTX 36 (SUTURE) ×3
SUT VIC AB 0 CTX36XBRD ANBCTRL (SUTURE) ×3 IMPLANT
SUT VIC AB 2-0 SH 27 (SUTURE) ×1
SUT VIC AB 2-0 SH 27XBRD (SUTURE) ×1 IMPLANT
TOWEL OR 17X24 6PK STRL BLUE (TOWEL DISPOSABLE) ×2 IMPLANT
TRAY FOLEY CATH 14FR (SET/KITS/TRAYS/PACK) ×2 IMPLANT
WATER STERILE IRR 1000ML POUR (IV SOLUTION) ×2 IMPLANT

## 2014-01-31 NOTE — Op Note (Signed)
Cesarean Section Procedure Note  Indications: P2 at 5136 1/7wks with severe preeclampsia and breech being recommended for delivery by MFM because the pt is symptomatic.  Pre-operative Diagnosis: 1.36 1/7 wks 2.Severe Preeclampsia 3.Breech   Post-operative Diagnosis: 1.36 1/7wks 2.Severe Preeclampsia 3.Simple Paratubal Cyst  Procedure: 1.CESAREAN SECTION 2.EXCISION OF PARATUBAL CYST  Surgeon: Purcell NailsAngela Y Ridley Dileo, MD    Assistants: Gerrit HeckJessica Emly, CNM  Anesthesia: Spinal  Anesthesiologist: Dana AllanAmy Cassidy, MD   Procedure Details  The patient was taken to the operating room secondary to severe preeclampsia after the risks, benefits, complications, treatment options, and expected outcomes were discussed with the patient.  The patient concurred with the proposed plan, giving informed consent which was signed and witnessed. The patient was taken to Operating Room 1, identified as Sandra EatonSara R Hudson and the procedure verified as C-Section Delivery. A Time Out was held and the above information confirmed.  After induction of anesthesia by obtaining a spinal, the patient was prepped and draped in the usual sterile manner. A Pfannenstiel skin incision was made and carried down through the subcutaneous tissue to the underlying layer of fascia.  The fascia was incised bilaterally and extended transversely bilaterally with the Mayo scissors. Kocher clamps were placed on the inferior aspect of the fascial incision and the underlying rectus muscle was separated from the fascia. The same was done on the superior aspect of the fascial incision.  The peritoneum was identified, entered bluntly and extended manually.  An Alexis self-retaining retractor was placed.  The utero-vesical peritoneal reflection was incised transversely and the bladder flap was bluntly freed from the lower uterine segment. A low transverse uterine incision was made with the scalpel and extended bilaterally with the bandage scissors.  The infant was  delivered in vertex position without difficulty.  After the umbilical cord was clamped and cut, the infant was handed to the awaiting pediatricians.  Cord blood was obtained for evaluation.  The placenta was removed intact and appeared to be within normal limits. The uterus was cleared of all clots and debris. The uterine incision was closed with running interlocking sutures of 0 Vicryl and a second imbricating layer was performed as well.   Bilateral tubes and ovaries appeared to be within normal limits except for an approximately 6cm paratubal cyst which was shelled out and cyst wall sent to pathology.  Some bleeding was noted and sutured with 2-0 vicryl.  Good hemostasis was noted.  Copious irrigation was performed until clear.  The peritoneum was repaired with 2-0 chromic via a running suture.  The fascia was reapproximated with a running suture of 0 Vicryl. The subcutaneous tissue was reapproximated with 3 interrupted sutures of 2-0 plain.  The skin was reapproximated with a subcuticular suture of 3-0 monocryl.  Steristrips were applied.  Instrument, sponge, and needle counts were correct prior to abdominal closure and at the conclusion of the case.  The patient was awaiting transfer to the recovery room in good condition.  Findings: Live female infant with Apgars 5 at one minute, 6 at five minutes and 7 at 10 minutes.  Normal appearing bilateral ovaries and fallopian tubes were noted.  Estimated Blood Loss:  See flowsheet         Drains: foley to gravity See flowsheet         Total IV Fluids: See flowsheet         Specimens to Pathology: Placenta and Cyst Wall         Complications:  None; patient tolerated the  procedure well.         Disposition: PACU - hemodynamically stable.         Condition: stable  Attending Attestation: I performed the procedure.

## 2014-01-31 NOTE — Transfer of Care (Signed)
Immediate Anesthesia Transfer of Care Note  Patient: Sandra EatonSara R Hudson  Procedure(s) Performed: Procedure(s): CESAREAN SECTION (N/A)  Patient Location: PACU  Anesthesia Type:Spinal  Level of Consciousness: awake, alert  and oriented  Airway & Oxygen Therapy: Patient Spontanous Breathing  Post-op Assessment: Report given to PACU RN and Post -op Vital signs reviewed and stable  Post vital signs: Reviewed and stable  Complications: No apparent anesthesia complications

## 2014-01-31 NOTE — Anesthesia Preprocedure Evaluation (Signed)
Anesthesia Evaluation  Patient identified by MRN, date of birth, ID band Patient awake    Reviewed: Allergy & Precautions, H&P , NPO status , Patient's Chart, lab work & pertinent test results, reviewed documented beta blocker date and time   History of Anesthesia Complications Negative for: history of anesthetic complications  Airway Mallampati: II TM Distance: >3 FB Neck ROM: full    Dental  (+) Teeth Intact   Pulmonary neg pulmonary ROS,  breath sounds clear to auscultation        Cardiovascular hypertension (preeclampsia on Magnesium), On Home Beta Blockers Rhythm:regular Rate:Normal     Neuro/Psych  Headaches, Depression    GI/Hepatic negative GI ROS, Neg liver ROS,   Endo/Other  neg diabetes (prior pregnancy)Morbid obesity  Renal/GU negative Renal ROS  negative genitourinary   Musculoskeletal   Abdominal   Peds  Hematology negative hematology ROS (+)   Anesthesia Other Findings Last ate before midnight Clear liquids 1200 today  Reproductive/Obstetrics (+) Pregnancy (preeclampsia at 36 weeks, breech --> primary C/S)                           Anesthesia Physical Anesthesia Plan  ASA: III  Anesthesia Plan: Spinal   Post-op Pain Management:    Induction:   Airway Management Planned:   Additional Equipment:   Intra-op Plan:   Post-operative Plan:   Informed Consent: I have reviewed the patients History and Physical, chart, labs and discussed the procedure including the risks, benefits and alternatives for the proposed anesthesia with the patient or authorized representative who has indicated his/her understanding and acceptance.     Plan Discussed with: Surgeon and CRNA  Anesthesia Plan Comments:         Anesthesia Quick Evaluation

## 2014-01-31 NOTE — Consult Note (Signed)
MFM consult, Staff Note  Impressions: 1. Persistent hypertensive range blood pressures with significant proteinuria plus headache with nausea/vomiting/right upper quadrant discomfort ---> severe preeclampsia at 5147w1d 2. Breech presentation of fetus, declined external cephalic version ? Summary of Recommendations: 1.  Initiate magnesium sulfate for maternal seizure prophylaxis until 24 hours postpartum 2. Treat severe range blood pressures with antihypertensive IV (eg, labetalol) 3. CEFM 4. I offered the patient external cephalic version to facilitate option of induction of labor citing a 60-70% rate of success but the patient declined.   4. Initiate magnesium sulfate for maternal seizure prophylaxis  5. Would recommend delivery by cesarean section when anesthesia is available and patient has been NPO for 6-8 hours.  I spent in excess of 30 minutes reviewing and evaluating this patient's case with greater than 50% of this time in face to face discussion. I spoke directly with Dr. Su Hiltoberts to relay my impression and recommendations.  Page with questions. Merideth AbbeyJ. M. Malcomb Gangemi, MD, MS, FACOG Assistant Professor, Maternal-Fetal Medicine

## 2014-01-31 NOTE — H&P (View-Only) (Signed)
Hospital day # 2 pregnancy at [redacted]w[redacted]d--PreEclampsia.  S:  Patient reports that HA/Migraine has subsided.  Continues to deny visual disturbances and epigastric pain. States feet are starting to swell and has SCD boots on.      Perception of contractions: none      Vaginal bleeding: None       Vaginal discharge:  no significant change  O: BP 153/93  Pulse 75  Temp(Src) 98 F (36.7 C) (Oral)  Resp 18  Ht 5' 8" (1.727 m)  Wt 245 lb (111.131 kg)  BMI 37.26 kg/m2  LMP 05/23/2013      Fetal tracings:145 bpm, Mod Var, -Decels, +Accels      Contractions:   None      Uterus gravid and non-tender      Extremities: DTR 2+, No clonus, 1+Pedial Edema           Labs: No results found for this or any previous visit (from the past 24 hour(s)).       Meds:  Scheduled Meds: . buPROPion  300 mg Oral Daily  . docusate sodium  100 mg Oral Daily  . DULoxetine  60 mg Oral Daily  . labetalol  100 mg Oral BID  . loratadine  10 mg Oral Daily  . prenatal multivitamin  1 tablet Oral Q1200  . sodium chloride  3 mL Intravenous Q12H   Continuous Infusions: . lactated ringers Stopped (01/30/14 0827)   PRN Meds:.acetaminophen, butalbital-acetaminophen-caffeine, calcium carbonate, zolpidem   A: [redacted]w[redacted]d with PreEclampsia Stable Bedrest  P: Continue current plan of care      Awaiting MFM cnsult/recommendations      MD to follow  Bryli Mantey CNM, MSN 01/31/2014 10:20 AM    

## 2014-01-31 NOTE — Progress Notes (Signed)
Fetal HR 145

## 2014-01-31 NOTE — Interval H&P Note (Signed)
History and Physical Interval Note:  01/31/2014 2:49 PM  Sandra Hudson  has presented today for surgery, with the diagnosis of Breech/Posterior Placenta  The various methods of treatment have been discussed with the patient and family. After consideration of risks, benefits and other options for treatment, the patient has consented to  Procedure(s): CESAREAN SECTION (N/A) as a surgical intervention .  The patient's history has been reviewed, patient examined, no change in status, stable for surgery.  I have reviewed the patient's chart and labs.  Questions were answered to the patient's satisfaction.     Sandra Hudson  Called by Dr. Katherina Rightenny after he saw her today and the pt was symptomatic and he recommends delivery.  The baby remains breech and the pt declined ECV per his discussion with her.  He also recommended to start mg sulfate.  The patient was then placed on the schedule for c-section secondary to severe preeclampsia.

## 2014-01-31 NOTE — Anesthesia Postprocedure Evaluation (Signed)
  Anesthesia Post-op Note  Anesthesia Post Note  Patient: Sandra Hudson  Procedure(s) Performed: Procedure(s) (LRB): CESAREAN SECTION (N/A)  Anesthesia type: Spinal  Patient location: PACU  Post pain: Pain level controlled  Post assessment: Post-op Vital signs reviewed  Last Vitals:  Filed Vitals:   01/31/14 1830  BP: 149/92  Pulse: 89  Temp:   Resp: 24    Post vital signs: Reviewed  Level of consciousness: awake  Complications: No apparent anesthesia complications

## 2014-01-31 NOTE — Anesthesia Procedure Notes (Signed)
Spinal  Patient location during procedure: OR Start time: 01/31/2014 3:33 PM Staffing Performed by: anesthesiologist  Preanesthetic Checklist Completed: patient identified, site marked, surgical consent, pre-op evaluation, timeout performed, IV checked, risks and benefits discussed and monitors and equipment checked Spinal Block Patient position: sitting Prep: site prepped and draped and DuraPrep Patient monitoring: heart rate, continuous pulse ox and blood pressure Approach: midline Location: L3-4 Injection technique: single-shot Needle Needle type: Pencan  Needle gauge: 24 G Needle length: 9 cm Assessment Sensory level: T4 Additional Notes Clear free flow CSF on first pass.  No paresthesia.  Patient tolerated procedure well with no apparent complications.  Jasmine DecemberA. Cassidy, MD

## 2014-01-31 NOTE — Progress Notes (Signed)
Hospital day # 2 pregnancy at 7558w1d--PreEclampsia.  S:  Patient reports that HA/Migraine has subsided.  Continues to deny visual disturbances and epigastric pain. States feet are starting to swell and has SCD boots on.      Perception of contractions: none      Vaginal bleeding: None       Vaginal discharge:  no significant change  O: BP 153/93  Pulse 75  Temp(Src) 98 F (36.7 C) (Oral)  Resp 18  Ht 5\' 8"  (1.727 m)  Wt 245 lb (111.131 kg)  BMI 37.26 kg/m2  LMP 05/23/2013      Fetal tracings:145 bpm, Mod Var, -Decels, +Accels      Contractions:   None      Uterus gravid and non-tender      Extremities: DTR 2+, No clonus, 1+Pedial Edema           Labs: No results found for this or any previous visit (from the past 24 hour(s)).       Meds:  Scheduled Meds: . buPROPion  300 mg Oral Daily  . docusate sodium  100 mg Oral Daily  . DULoxetine  60 mg Oral Daily  . labetalol  100 mg Oral BID  . loratadine  10 mg Oral Daily  . prenatal multivitamin  1 tablet Oral Q1200  . sodium chloride  3 mL Intravenous Q12H   Continuous Infusions: . lactated ringers Stopped (01/30/14 0827)   PRN Meds:.acetaminophen, butalbital-acetaminophen-caffeine, calcium carbonate, zolpidem   A: 7158w1d with PreEclampsia Stable Bedrest  P: Continue current plan of care      Awaiting MFM cnsult/recommendations      MD to follow  Gerrit HeckJessica Stasia Somero CNM, MSN 01/31/2014 10:20 AM

## 2014-02-01 DIAGNOSIS — Z98891 History of uterine scar from previous surgery: Secondary | ICD-10-CM

## 2014-02-01 HISTORY — DX: History of uterine scar from previous surgery: Z98.891

## 2014-02-01 LAB — COMPREHENSIVE METABOLIC PANEL
ALK PHOS: 81 U/L (ref 39–117)
ALT: 11 U/L (ref 0–35)
AST: 20 U/L (ref 0–37)
Albumin: 1.6 g/dL — ABNORMAL LOW (ref 3.5–5.2)
BUN: 9 mg/dL (ref 6–23)
CALCIUM: 7.4 mg/dL — AB (ref 8.4–10.5)
CO2: 25 meq/L (ref 19–32)
Chloride: 98 mEq/L (ref 96–112)
Creatinine, Ser: 0.96 mg/dL (ref 0.50–1.10)
GFR calc Af Amer: 87 mL/min — ABNORMAL LOW (ref >90)
GFR calc non Af Amer: 75 mL/min — ABNORMAL LOW (ref >90)
GLUCOSE: 119 mg/dL — AB (ref 70–99)
POTASSIUM: 5 meq/L (ref 3.7–5.3)
SODIUM: 131 meq/L — AB (ref 137–147)
Total Bilirubin: <0.2 mg/dL — ABNORMAL LOW (ref 0.3–1.2)
Total Protein: 5 g/dL — ABNORMAL LOW (ref 6.0–8.3)

## 2014-02-01 LAB — CBC
HCT: 30.1 % — ABNORMAL LOW (ref 36.0–46.0)
HEMOGLOBIN: 9.8 g/dL — AB (ref 12.0–15.0)
MCH: 26.6 pg (ref 26.0–34.0)
MCHC: 32.6 g/dL (ref 30.0–36.0)
MCV: 81.6 fL (ref 78.0–100.0)
PLATELETS: 228 10*3/uL (ref 150–400)
RBC: 3.69 MIL/uL — ABNORMAL LOW (ref 3.87–5.11)
RDW: 13.1 % (ref 11.5–15.5)
WBC: 11.3 10*3/uL — ABNORMAL HIGH (ref 4.0–10.5)

## 2014-02-01 LAB — MAGNESIUM: Magnesium: 5.2 mg/dL — ABNORMAL HIGH (ref 1.5–2.5)

## 2014-02-01 LAB — MRSA PCR SCREENING: MRSA by PCR: NEGATIVE

## 2014-02-01 MED ORDER — LORATADINE 10 MG PO TABS
10.0000 mg | ORAL_TABLET | Freq: Every day | ORAL | Status: DC
  Administered 2014-02-01 – 2014-02-03 (×3): 10 mg via ORAL
  Filled 2014-02-01 (×4): qty 1

## 2014-02-01 MED ORDER — DULOXETINE HCL 60 MG PO CPEP
60.0000 mg | ORAL_CAPSULE | Freq: Every day | ORAL | Status: DC
  Administered 2014-02-01 – 2014-02-03 (×3): 60 mg via ORAL
  Filled 2014-02-01 (×4): qty 1

## 2014-02-01 MED ORDER — FERROUS SULFATE 325 (65 FE) MG PO TABS
325.0000 mg | ORAL_TABLET | Freq: Every day | ORAL | Status: DC
  Administered 2014-02-01 – 2014-02-03 (×3): 325 mg via ORAL
  Filled 2014-02-01 (×3): qty 1

## 2014-02-01 MED ORDER — LABETALOL HCL 100 MG PO TABS
100.0000 mg | ORAL_TABLET | Freq: Two times a day (BID) | ORAL | Status: DC
  Administered 2014-02-01 – 2014-02-03 (×5): 100 mg via ORAL
  Filled 2014-02-01 (×7): qty 1

## 2014-02-01 NOTE — Lactation Note (Signed)
This note was copied from the chart of Sandra Hudson. Lactation Consultation Note Spoke w/mom about supplementing w/formula d/t 36 wks. Haven't had many feedings. Discussed options. Mom didn't want to use bottles and nipples d/t first baby having so much trouble latching and this baby is latching well. Gave foley cup and curve tip syring w/verbal instructions on feeding baby. Mom didn't want the baby woke up to feed at this time. 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 Enfamil 20 cal. Given w/feeding sheet amount according to baby's age. Mom states that Lt. Nipple doesn't come out as well as Rt. Nipple. Pt. Laying on Lt. Side, noted slight edema to Lt. Breast at the base of the areola. None noted to Rt. Nipple. Shells given to pt. To wear w/bra. Instructed not to sleep in them. Patient Name: Sandra Hudson Today's Date: 02/01/2014 Reason for consult: Initial assessment;Late preterm infant   Maternal Data Has patient been taught Hand Expression?: Yes Does the patient have breastfeeding experience prior to this delivery?: Yes  Feeding Feeding Type: Breast Fed Length of feed: 15 min  LATCH Score/Interventions Latch: Repeated attempts needed to sustain latch, nipple held in mouth throughout feeding, stimulation needed to elicit sucking reflex. Intervention(s): Assist with latch  Audible Swallowing: None Intervention(s): Skin to skin;Hand expression  Type of Nipple: Everted at rest and after stimulation Intervention(s): Double electric pump  Comfort (Breast/Nipple): Soft / non-tender     Hold (Positioning): Assistance needed to correctly position infant at breast and maintain latch.  LATCH Score: 6  Lactation Tools Discussed/Used Tools: Pump Breast pump type: Double-Electric Breast Pump   Consult Status Consult Status: Follow-up Date: 02/02/14 Follow-up type: In-patient    Charyl DancerLaura G Alda Gaultney 02/01/2014, 3:07 PM

## 2014-02-01 NOTE — Lactation Note (Signed)
This note was copied from the chart of [name]. Lactation Consultation Note AICU mom on mag. Experienced mom pumped for 1 yr. And bottle fed d/t baby wouldn't latch to breast. Mom has everted nippled, soft, when compresses slightly compresses inwards but stretches outwards w/o difficulty. Baby latches well, gets frustrated easily, mom pressing baby in breast to far and covers baby's nose. Encouraged to relax and have baby's cheek touch breast but not to let face be covered w/breast. Could be why baby's is pulling off and frustrated. Good latch noted. DEBP set up on preemie settinMom encouraged to feed baby 8-12 times/24 hours and with feeding cues. Specifics of an asymmetric latch shown. g. Encouraged to pre-pump, breast feed and post pump. Noted colostrum came to end of nipple at the end of pumping. Discussed the purpose of preemie setting and importance of pumping. Reviewed Baby & Me book's Breastfeeding Basics. Mom encouraged to feed baby w/feeding cuesMom shown how to use DEBP & how to disassemble, clean, & reassemble parts. Hand expression taught to Mom. Mother informed of post-discharge support and given phone number to the lactation department, including services for phone call assistance; out-patient appointments; and breastfeeding support group. List of other breastfeeding resources in the community given in the handout. Encouraged mother to call for problems or concerns related to breastfeeding.Encouraged comfort during BF so colostrum flows better and mom will enjoy the feeding longer. Taking deep breaths and breast massage during BF.  Patient Name: [name] Today's Date: 02/01/2014 Reason for consult: Initial assessment;Late preterm infant   Maternal Data Has patient been taught Hand Expression?: Yes Does the patient have breastfeeding experience prior to this delivery?: Yes  Feeding Feeding Type: Breast Fed Length of feed: 15 min  LATCH Score/Interventions Latch: Repeated  attempts needed to sustain latch, nipple held in mouth throughout feeding, stimulation needed to elicit sucking reflex. Intervention(s): Assist with latch  Audible Swallowing: None Intervention(s): Skin to skin;Hand expression  Type of Nipple: Everted at rest and after stimulation Intervention(s): Double electric pump  Comfort (Breast/Nipple): Soft / non-tender     Hold (Positioning): Assistance needed to correctly position infant at breast and maintain latch. Intervention(s): Breastfeeding basics reviewed;Support Pillows;Position options;Skin to skin  LATCH Score: 6  Lactation Tools Discussed/Used Tools: Pump Breast pump type: Double-Electric Breast Pump   Consult Status Consult Status: Follow-up Date: 02/02/14 Follow-up type: In-patient    Charyl DancerLaura G Xochitl Egle 02/01/2014, 12:38 PM

## 2014-02-01 NOTE — Progress Notes (Addendum)
Subjective: Postpartum Day 1: Cesarean Delivery due to severe pre-eclampsia, previous breech presentation (vtx at delivery) Patient stood at bedside early this am, did get slightly dizzy.  Has moderate HA--hx of migraines.  Denies visual sx, epigastric pain, SOB, or chest pain. Feeding:  Breast Contraceptive plan:  Undecided  Objective: Vital signs in last 24 hours: Temp:  [97.5 F (36.4 C)-98.3 F (36.8 C)] 98.2 F (36.8 C) (04/18 0802) Pulse Rate:  [73-103] 79 (04/18 0802) Resp:  [16-30] 18 (04/18 0802) BP: (121-166)/(66-102) 144/89 mmHg (04/18 0802) SpO2:  [98 %-100 %] 100 % (04/18 0802) Weight:  [246 lb (111.585 kg)] 246 lb (111.585 kg) (04/17 2005)  Filed Vitals:   02/01/14 0300 02/01/14 0606 02/01/14 0750 02/01/14 0802  BP:  139/84 142/97 144/89  Pulse: 80   79  Temp: 98.1 F (36.7 C)   98.2 F (36.8 C)  TempSrc: Oral   Oral  Resp: 16   18  Height:      Weight:      SpO2: 98%   100%   BP range since delivery:  135-157/77-100. BP range last shift:  139-155/84-97   Physical Exam:  General: alert Lochia: appropriate Uterine Fundus: firm Incision: Honeycomb dressing CDI Abdomen:  Faint bowel sounds, + gaseous distension. DVT Evaluation: No evidence of DVT seen on physical exam. Negative Homan's sign.  SCDs in place.  DTR 1+, no clonus, 1+ edema Foley still in place, draining clear urine.  I&O: +1819 last 24 hours Output:  400 cc this am Output not documented from 8:15pm to 7am--dayshift RN investigating.  Magnesium 2 gm/hr since delivery at 3:59p  Results for orders placed during the hospital encounter of 01/29/14 (from the past 24 hour(s))  CBC     Status: None   Collection Time    01/31/14 12:06 PM      Result Value Ref Range   WBC 8.3  4.0 - 10.5 K/uL   RBC 4.41  3.87 - 5.11 MIL/uL   Hemoglobin 12.0  12.0 - 15.0 g/dL   HCT 16.136.1  09.636.0 - 04.546.0 %   MCV 81.9  78.0 - 100.0 fL   MCH 27.2  26.0 - 34.0 pg   MCHC 33.2  30.0 - 36.0 g/dL   RDW 40.912.9  81.111.5 - 91.415.5  %   Platelets 229  150 - 400 K/uL  COMPREHENSIVE METABOLIC PANEL     Status: Abnormal   Collection Time    01/31/14 12:06 PM      Result Value Ref Range   Sodium 136 (*) 137 - 147 mEq/L   Potassium 4.6  3.7 - 5.3 mEq/L   Chloride 101  96 - 112 mEq/L   CO2 23  19 - 32 mEq/L   Glucose, Bld 111 (*) 70 - 99 mg/dL   BUN 8  6 - 23 mg/dL   Creatinine, Ser 7.820.75  0.50 - 1.10 mg/dL   Calcium 8.4  8.4 - 95.610.5 mg/dL   Total Protein 5.7 (*) 6.0 - 8.3 g/dL   Albumin 1.9 (*) 3.5 - 5.2 g/dL   AST 16  0 - 37 U/L   ALT 12  0 - 35 U/L   Alkaline Phosphatase 90  39 - 117 U/L   Total Bilirubin <0.2 (*) 0.3 - 1.2 mg/dL   GFR calc non Af Amer >90  >90 mL/min   GFR calc Af Amer >90  >90 mL/min  LACTATE DEHYDROGENASE     Status: None   Collection Time  01/31/14 12:06 PM      Result Value Ref Range   LDH 187  94 - 250 U/L  URIC ACID     Status: None   Collection Time    01/31/14 12:06 PM      Result Value Ref Range   Uric Acid, Serum 6.4  2.4 - 7.0 mg/dL  MRSA PCR SCREENING     Status: None   Collection Time    01/31/14  8:40 PM      Result Value Ref Range   MRSA by PCR NEGATIVE  NEGATIVE  CBC     Status: Abnormal   Collection Time    01/31/14 11:00 PM      Result Value Ref Range   WBC 13.8 (*) 4.0 - 10.5 K/uL   RBC 4.07  3.87 - 5.11 MIL/uL   Hemoglobin 11.0 (*) 12.0 - 15.0 g/dL   HCT 45.433.2 (*) 09.836.0 - 11.946.0 %   MCV 81.6  78.0 - 100.0 fL   MCH 27.0  26.0 - 34.0 pg   MCHC 33.1  30.0 - 36.0 g/dL   RDW 14.713.0  82.911.5 - 56.215.5 %   Platelets 213  150 - 400 K/uL  COMPREHENSIVE METABOLIC PANEL     Status: Abnormal   Collection Time    01/31/14 11:00 PM      Result Value Ref Range   Sodium 135 (*) 137 - 147 mEq/L   Potassium 5.0  3.7 - 5.3 mEq/L   Chloride 99  96 - 112 mEq/L   CO2 25  19 - 32 mEq/L   Glucose, Bld 107 (*) 70 - 99 mg/dL   BUN 9  6 - 23 mg/dL   Creatinine, Ser 1.300.90  0.50 - 1.10 mg/dL   Calcium 7.9 (*) 8.4 - 10.5 mg/dL   Total Protein 5.0 (*) 6.0 - 8.3 g/dL   Albumin 1.7 (*) 3.5 -  5.2 g/dL   AST 20  0 - 37 U/L   ALT 11  0 - 35 U/L   Alkaline Phosphatase 86  39 - 117 U/L   Total Bilirubin <0.2 (*) 0.3 - 1.2 mg/dL   GFR calc non Af Amer 81 (*) >90 mL/min   GFR calc Af Amer >90  >90 mL/min  MAGNESIUM     Status: Abnormal   Collection Time    01/31/14 11:00 PM      Result Value Ref Range   Magnesium 4.5 (*) 1.5 - 2.5 mg/dL  CBC     Status: Abnormal   Collection Time    02/01/14  5:35 AM      Result Value Ref Range   WBC 11.3 (*) 4.0 - 10.5 K/uL   RBC 3.69 (*) 3.87 - 5.11 MIL/uL   Hemoglobin 9.8 (*) 12.0 - 15.0 g/dL   HCT 86.530.1 (*) 78.436.0 - 69.646.0 %   MCV 81.6  78.0 - 100.0 fL   MCH 26.6  26.0 - 34.0 pg   MCHC 32.6  30.0 - 36.0 g/dL   RDW 29.513.1  28.411.5 - 13.215.5 %   Platelets 228  150 - 400 K/uL  COMPREHENSIVE METABOLIC PANEL     Status: Abnormal   Collection Time    02/01/14  5:35 AM      Result Value Ref Range   Sodium 131 (*) 137 - 147 mEq/L   Potassium 5.0  3.7 - 5.3 mEq/L   Chloride 98  96 - 112 mEq/L   CO2 25  19 - 32 mEq/L  Glucose, Bld 119 (*) 70 - 99 mg/dL   BUN 9  6 - 23 mg/dL   Creatinine, Ser 1.61  0.50 - 1.10 mg/dL   Calcium 7.4 (*) 8.4 - 10.5 mg/dL   Total Protein 5.0 (*) 6.0 - 8.3 g/dL   Albumin 1.6 (*) 3.5 - 5.2 g/dL   AST 20  0 - 37 U/L   ALT 11  0 - 35 U/L   Alkaline Phosphatase 81  39 - 117 U/L   Total Bilirubin <0.2 (*) 0.3 - 1.2 mg/dL   GFR calc non Af Amer 75 (*) >90 mL/min   GFR calc Af Amer 87 (*) >90 mL/min  MAGNESIUM     Status: Abnormal   Collection Time    02/01/14  5:35 AM      Result Value Ref Range   Magnesium 5.2 (*) 1.5 - 2.5 mg/dL     Recent Labs  09/60/45 1206 01/31/14 2300 02/01/14 0535  HGB 12.0 11.0* 9.8*  HCT 36.1 33.2* 30.1*  WBC 8.3 13.8* 11.3*    Assessment/Plan: Status post Cesarean section day 1. Severe pre-eclampsia BP still mildly elevated Anemia, slight hemodynamic instability  Plan: Orthostatic VS Fe q day Push po fluids, regular diet this am Watch urine output this am--will d/c foley if  urine output WNL and patient hemodynamically stable. Will consult with Dr. Su Hilt regarding BP management. Anticipate d/c magnesium at 3:59pm if stable--observe x 4 hours prior to transfer to [facility]. PO meds for HA   Nigel Bridgeman 02/01/2014, 8:24 AM   Addendum: Consulted with Dr. Su Hilt. Labetalol 100 mg po BID. Patient had hypotensive episode after last delivery with initial Labetalol dose on day of d/c--was given in response to single elevated BP at that point. Will monitor response to Labetalol. Patient agreeable with plan.  Nigel Bridgeman, CNM 02/01/14 1010  Agree with above.  Doing well.  Mg has been discontinued.  BP is good.  Will cont to monitor.  May d/c foley.  UOP is good and my transfer to [facility] this evening.

## 2014-02-02 MED ORDER — BISACODYL 10 MG RE SUPP
10.0000 mg | Freq: Once | RECTAL | Status: AC
  Administered 2014-02-02: 10 mg via RECTAL
  Filled 2014-02-02: qty 1

## 2014-02-02 NOTE — Progress Notes (Signed)
Subjective: Postpartum Day 2: Cesarean Delivery due to severe pre-eclampsia, prior breech presentation Patient up ad lib, reports no syncope or dizziness.  More sore than yesterday, but pain med effective.  Had gas pain up to right shoulder last night, better today.  No flatus noted by patient.  Denies HA, visual sx, or epigastric pain. Denies pp depression. Feeding:  Working on breastfeeding Contraceptive plan:  Likely condoms, may consider IUD in future. Feels urine output has increased overnight Patient's mother (CNM from [place]) here with her and will be with her x 2 weeks.  Objective: Vital signs in last 24 hours: Temp:  [97.7 F (36.5 C)-98.4 F (36.9 C)] 98 F (36.7 C) (04/19 0620) Pulse Rate:  [75-90] 86 (04/19 0620) Resp:  [16-18] 16 (04/19 0620) BP: (119-151)/(68-131) 124/75 mmHg (04/19 0620) SpO2:  [98 %-100 %] 99 % (04/18 2230) Weight:  [239 lb (108.41 kg)] 239 lb (108.41 kg) (04/18 1430)  Weight on admission 01/29/14 = 245    01/31/14 = 246        02/01/14 = 239      Filed Vitals:   02/01/14 2230 02/02/14 0014 02/02/14 0238 02/02/14 0620  BP: 141/84 139/79 124/72 124/75  Pulse: 85 79 75 86  Temp: 98.2 F (36.8 C) 98.4 F (36.9 C) 98.2 F (36.8 C) 98 F (36.7 C)  TempSrc: Oral Oral Oral   Resp: 18 18 18 16   Height:      Weight:      SpO2: 99%      On Labetalol 100 mg po BID. Continuing on Wellbutrin 300 mg XL daily, and Cymbalta DR 60 mg daily.  Physical Exam:  General: alert Lochia: appropriate Uterine Fundus: firm Abdomen:  + faint bowel sounds, no significant gaseous distension Incision: Honeycomb dressing CDI DVT Evaluation: No evidence of DVT seen on physical exam. Negative Homan's sign. Calf/Ankle edema is present, 2+, DTR 2+, with 1 beat clonus bilaterally  I/O balance:  --1647 last shift Urine output last 24 hours:  4525 cc  Recent Labs  01/31/14 1206 01/31/14 2300 02/01/14 0535  HGB 12.0 11.0* 9.8*  HCT 36.1 33.2* 30.1*  WBC 8.3 13.8*  11.3*    Assessment/Plan: Status post Cesarean section day 2 Severe pre-eclampsia--resolving, on Labetalol 100 mg po BID Appropriate diuresis Mild anemia--hemodynamically stable Doing well postoperatively.  Plan:  Continue current care. Dulcolax supp today Daily weights Assess pedal edema/fluid status tomorrow--consider HCTZ x 1 week prn Plan for discharge tomorrow    Nigel BridgemanVicki Caoimhe Damron 02/02/2014, 8:40 AM

## 2014-02-03 ENCOUNTER — Ambulatory Visit: Payer: Self-pay

## 2014-02-03 ENCOUNTER — Encounter (HOSPITAL_COMMUNITY): Payer: Self-pay | Admitting: Obstetrics and Gynecology

## 2014-02-03 MED ORDER — LABETALOL HCL 100 MG PO TABS: 200.0000 mg | ORAL_TABLET | Freq: Two times a day (BID) | ORAL | Status: DC

## 2014-02-03 MED ORDER — IBUPROFEN 600 MG PO TABS: 600.0000 mg | ORAL_TABLET | Freq: Four times a day (QID) | ORAL | Status: DC

## 2014-02-03 MED ORDER — OXYCODONE-ACETAMINOPHEN 5-325 MG PO TABS: 1.0000 | ORAL_TABLET | ORAL | Status: DC | PRN

## 2014-02-03 MED ORDER — FERROUS SULFATE 325 (65 FE) MG PO TABS: 325.0000 mg | ORAL_TABLET | Freq: Every day | ORAL | Status: DC

## 2014-02-03 NOTE — Discharge Instructions (Signed)
Iron-Rich Diet An iron-rich diet contains foods that are good sources of iron. Iron is an important mineral that helps your body produce hemoglobin. Hemoglobin is a protein in red blood cells that carries oxygen to the body's tissues. Sometimes, the iron level in your blood can be low. This may be caused by:  A lack of iron in your diet.  Blood loss.  Times of growth, such as during pregnancy or during a child's growth and development. Low levels of iron can cause a decrease in the number of red blood cells. This can result in iron deficiency anemia. Iron deficiency anemia symptoms include:  Tiredness.  Weakness.  Irritability.  Increased chance of infection. Here are some recommendations for daily iron intake:  Males older than 37 years of age need 8 mg of iron per day.  Women ages 43 to 52 need 18 mg of iron per day.  Pregnant women need 27 mg of iron per day, and women who are over 56 years of age and breastfeeding need 9 mg of iron per day.  Women over the age of 49 need 8 mg of iron per day. SOURCES OF IRON There are 2 types of iron that are found in food: heme iron and nonheme iron. Heme iron is absorbed by the body better than nonheme iron. Heme iron is found in meat, poultry, and fish. Nonheme iron is found in grains, beans, and vegetables. Heme Iron Sources Food / Iron (mg)  Chicken liver, 3 oz (85 g)/ 10 mg  Beef liver, 3 oz (85 g)/ 5.5 mg  Oysters, 3 oz (85 g)/ 8 mg  Beef, 3 oz (85 g)/ 2 to 3 mg  Shrimp, 3 oz (85 g)/ 2.8 mg  [place], 3 oz (85 g)/ 2 mg  Chicken, 3 oz (85 g) / 1 mg  Fish (tuna, halibut), 3 oz (85 g)/ 1 mg  Pork, 3 oz (85 g)/ 0.9 mg Nonheme Iron Sources Food / Iron (mg)  Ready-to-eat breakfast cereal, iron-fortified / 3.9 to 7 mg  Tofu,  cup / 3.4 mg  Kidney beans,  cup / 2.6 mg  Baked potato with skin / 2.7 mg  Asparagus,  cup / 2.2 mg  Avocado / 2 mg  Dried peaches,  cup / 1.6 mg  Raisins,  cup / 1.5 mg  Soy milk, 1 cup  / 1.5 mg  Whole-wheat bread, 1 slice / 1.2 mg  Spinach, 1 cup / 0.8 mg  Broccoli,  cup / 0.6 mg IRON ABSORPTION Certain foods can decrease the body's absorption of iron. Try to avoid these foods and beverages while eating meals with iron-containing foods:  Coffee.  Tea.  Fiber.  Soy. Foods containing vitamin C can help increase the amount of iron your body absorbs from iron sources, especially from nonheme sources. Eat foods with vitamin C along with iron-containing foods to increase your iron absorption. Foods that are high in vitamin C include many fruits and vegetables. Some good sources are:  Fresh orange juice.  Oranges.  Strawberries.  Mangoes.  Grapefruit.  Red bell peppers.  Green bell peppers.  Broccoli.  Potatoes with skin.  Tomato juice. Document Released: 05/17/2005 Document Revised: 12/26/2011 Document Reviewed: 03/24/2011 [facility] Patient Information 2014 [place], [place]. Postpartum Care After Cesarean Delivery After you deliver your newborn (postpartum period), the usual stay in the hospital is 24 72 hours. If there were problems with your labor or delivery, or if you have other medical problems, you might be in the hospital longer.  While you are in the hospital, you will receive help and instructions on how to care for yourself and your newborn during the postpartum period.  While you are in the hospital:  It is normal for you to have pain or discomfort from the incision in your abdomen. Be sure to tell your nurses when you are having pain, where the pain is located, and what makes the pain worse.  If you are breastfeeding, you may feel uncomfortable contractions of your uterus for a couple of weeks. This is normal. The contractions help your uterus get back to normal size.  It is normal to have some bleeding after delivery.  For the first 1 3 days after delivery, the flow is red and the amount may be similar to a period.  It is common for the  flow to start and stop.  In the first few days, you may pass some small clots. Let your nurses know if you begin to pass large clots or your flow increases.  Do not  flush blood clots down the toilet before having the nurse look at them.  During the next 3 10 days after delivery, your flow should become more watery and pink or brown-tinged in color.  Ten to fourteen days after delivery, your flow should be a small amount of yellowish-white discharge.  The amount of your flow will decrease over the first few weeks after delivery. Your flow may stop in 6 8 weeks. Most women have had their flow stop by 12 weeks after delivery.  You should change your sanitary pads frequently.  Wash your hands thoroughly with soap and water for at least 20 seconds after changing pads, using the toilet, or before holding or feeding your newborn.  Your intravenous (IV) tubing will be removed when you are drinking enough fluids.  The urine drainage tube (urinary catheter) that was inserted before delivery may be removed within 6 8 hours after delivery or when feeling returns to your legs. You should feel like you need to empty your bladder within the first 6 8 hours after the catheter has been removed.  In case you become weak, lightheaded, or faint, call your nurse before you get out of bed for the first time and before you take a shower for the first time.  Within the first few days after delivery, your breasts may begin to feel tender and full. This is called engorgement. Breast tenderness usually goes away within 48 72 hours after engorgement occurs. You may also notice milk leaking from your breasts. If you are not breastfeeding, do not stimulate your breasts. Breast stimulation can make your breasts produce more milk.  Spending as much time as possible with your newborn is very important. During this time, you and your newborn can feel close and get to know each other. Having your newborn stay in your room  (rooming in) will help to strengthen the bond with your newborn. It will give you time to get to know your newborn and become comfortable caring for your newborn.  Your hormones change after delivery. Sometimes the hormone changes can temporarily cause you to feel sad or tearful. These feelings should not last more than a few days. If these feelings last longer than that, you should talk to your caregiver.  If desired, talk to your caregiver about methods of family planning or contraception.  Talk to your caregiver about immunizations. Your caregiver may want you to have the following immunizations before leaving the hospital:  Tetanus,  diphtheria, and pertussis (Tdap) or tetanus and diphtheria (Td) immunization. It is very important that you and your family (including grandparents) or others caring for your newborn are up-to-date with the Tdap or Td immunizations. The Tdap or Td immunization can help protect your newborn from getting ill.  Rubella immunization.  Varicella (chickenpox) immunization.  Influenza immunization. You should receive this annual immunization if you did not receive the immunization during your pregnancy. Document Released: 06/27/2012 Document Reviewed: 06/27/2012 [facility] Patient Information 2014 [place]. Postpartum Depression and Baby Blues The postpartum period begins right after the birth of a baby. During this time, there is often a great amount of joy and excitement. It is also a time of considerable changes in the life of the parent(s). Regardless of how many times a mother gives birth, each child brings new challenges and dynamics to the family. It is not unusual to have feelings of excitement accompanied by confusing shifts in moods, emotions, and thoughts. All mothers are at risk of developing postpartum depression or the "baby blues." These mood changes can occur right after giving birth, or they may occur many months after giving birth. The baby blues or  postpartum depression can be mild or severe. Additionally, postpartum depression can resolve rather quickly, or it can be a long-term condition. CAUSES Elevated hormones and their rapid decline are thought to be a main cause of postpartum depression and the baby blues. There are a number of hormones that radically change during and after pregnancy. Estrogen and progesterone usually decrease immediately after delivering your baby. The level of thyroid hormone and various cortisol steroids also rapidly drop. Other factors that play a major role in these changes include major life events and genetics.  RISK FACTORS If you have any of the following risks for the baby blues or postpartum depression, know what symptoms to watch out for during the postpartum period. Risk factors that may increase the likelihood of getting the baby blues or postpartum depression include:  Havinga personal or family history of depression.  Having depression while being pregnant.  Having premenstrual or oral contraceptive-associated mood issues.  Having exceptional life stress.  Having marital conflict.  Lacking a social support network.  Having a baby with special needs.  Having health problems such as diabetes. SYMPTOMS Baby blues symptoms include:  Brief fluctuations in mood, such as going from extreme happiness to sadness.  Decreased concentration.  Difficulty sleeping.  Crying spells, tearfulness.  Irritability.  Anxiety. Postpartum depression symptoms typically begin within the first month after giving birth. These symptoms include:  Difficulty sleeping or excessive sleepiness.  Marked weight loss.  Agitation.  Feelings of worthlessness.  Lack of interest in activity or food. Postpartum psychosis is a very concerning condition and can be dangerous. Fortunately, it is rare. Displaying any of the following symptoms is cause for immediate medical attention. Postpartum psychosis symptoms  include:  Hallucinations and delusions.  Bizarre or disorganized behavior.  Confusion or disorientation. DIAGNOSIS  A diagnosis is made by an evaluation of your symptoms. There are no medical or lab tests that lead to a diagnosis, but there are various questionnaires that a caregiver may use to identify those with the baby blues, postpartum depression, or psychosis. Often times, a screening tool called the [place] Postnatal Depression Scale is used to diagnose depression in the postpartum period.  TREATMENT The baby blues usually goes away on its own in 1 to 2 weeks. Social support is often all that is needed. You should be encouraged  to get adequate sleep and rest. Occasionally, you may be given medicines to help you sleep.  Postpartum depression requires treatment as it can last several months or longer if it is not treated. Treatment may include individual or group therapy, medicine, or both to address any social, physiological, and psychological factors that may play a role in the depression. Regular exercise, a healthy diet, rest, and social support may also be strongly recommended.  Postpartum psychosis is more serious and needs treatment right away. Hospitalization is often needed. HOME CARE INSTRUCTIONS  Get as much rest as you can. Nap when the baby sleeps.  Exercise regularly. Some women find yoga and walking to be beneficial.  Eat a balanced and nourishing diet.  Do little things that you enjoy. Have a cup of tea, take a bubble bath, read your favorite magazine, or listen to your favorite music.  Avoid alcohol.  Ask for help with household chores, cooking, grocery shopping, or running errands as needed. Do not try to do everything.  Talk to people close to you about how you are feeling. Get support from your partner, family members, friends, or other new moms.  Try to stay positive in how you think. Think about the things you are grateful for.  Do not spend a lot of time  alone.  Only take medicine as directed by your caregiver.  Keep all your postpartum appointments.  Let your caregiver know if you have any concerns. SEEK MEDICAL CARE IF: You are having a reaction or problems with your medicine. SEEK IMMEDIATE MEDICAL CARE IF:  You have suicidal feelings.  You feel you may harm the baby or someone else. Document Released: 07/07/2004 Document Revised: 12/26/2011 Document Reviewed: 07/15/2013 [facility] Patient Information 2014 [place], [place].

## 2014-02-03 NOTE — Lactation Note (Signed)
This note was copied from the chart of [name]. Lactation Consultation Note  Patient Name: [name] [id number]'s Date: 02/03/2014 Reason for consult: Follow-up assessment Updated doc flow sheets with feedings , and voids and stools. Per mom following the LC plan , feeding and supplementing. LC recommended because the baby is a LPT  Infant and 8% weight loss to change finger feeding  to supplementing with a medium based nipple. Per mom has a DR. Brown system at home.  Reviewed sore nipple,engorgement prevention and tx if needed. Per mom has a DEBP at home  And is aware of how to use it . Mom will be post pumping due to baby being a later pre-term infant with 8% weight loss. F/U apt. [facility] Mom aware.    Maternal Data Formula Feeding for Exclusion: No  Feeding Feeding Type: Breast Fed Length of feed: 10 min (breast feeding)  LATCH Score/Interventions                Intervention(s): Breastfeeding basics reviewed (see LC note )     Lactation Tools Discussed/Used     Consult Status Consult Status: Follow-up Date: 02/07/14 ([facility]    Sandra Hudson 02/03/2014, 1:02 PM

## 2014-02-03 NOTE — Discharge Summary (Signed)
Cesarean Section Delivery Discharge Summary  Sandra Hudson  DOB:    April 07, 1976 MRN:    [id number] CSN:    [id number]  Date of admission:                  01/29/2014  Date of discharge:                   02/03/2014  Procedures this admission: Primary LTCS for Severe PreEclampsia with Breech presentation  Date of Delivery: 01/31/2014  Newborn Data:  Live born female  Birth Weight: 7 lb 2.1 oz (3235 g) APGAR: 5, 6  Home with mother. Name: Katlyn Circumcision Plan: N/A  History of Present Illness:  Sandra Hudson is a 38 y.o. female, G2P1102, who presents at [redacted]w[redacted]d weeks gestation. The patient has been followed at the [facility] division of [facility].    Her pregnancy has been complicated by:  Patient Active Problem List   Diagnosis Date Noted  . Status post primary low transverse cesarean section 02/01/2014  . Pre-eclampsia 01/29/2014  . Obesity, unspecified 01/29/2014  . Depressive disorder, not elsewhere classified 01/29/2014  . Breech presentation 01/29/2014  . Advanced maternal age (AMA) in pregnancy 01/29/2014  . Pregnant state, incidental 12/17/2013  . Encounter for preventive health examination 11/07/2011  . Rash 11/07/2011  . H/o PIH (pregnancy induced hypertension) 12/19/2010  . H/o Gestational diabetes mellitus 12/19/2010  . PROTEINURIA 09/15/2010  . INFERTILITY, ANOVULATORY 09/14/2009  . HYPERGLYCEMIA 07/16/2008  . ACNE VULGARIS, FACIAL 11/06/2007  . DISORDER, ADJUSTMENT W/DEPRESSED MOOD 08/14/2007  . SLEEPLESSNESS 08/14/2007  . HEADACHE 07/11/2007  . RECTAL BLEEDING, HX OF 07/11/2007  . HYPERLIPIDEMIA NEC/NOS 05/08/2007  . MIGRAINE HEADACHE 02/27/2007  . ALLERGIC RHINITIS 02/27/2007   Preeclampsia.  Hospital course:  The patient was admitted for severe preeclampsia.   Her postpartum course was not complicated. Patient was started and will remain on Labetalol for b/p mgmt. She was discharged to home on  postpartum day 3 doing well.  Feeding:  breast  Contraception:  condoms  Discharge hemoglobin:  Hemoglobin  Date Value Ref Range Status  02/01/2014 9.8* 12.0 - 15.0 g/dL Final  7/82/9562 13.0   Final     HCT  Date Value Ref Range Status  02/01/2014 30.1* 36.0 - 46.0 % Final  07/02/2013 40   Final    Discharge Physical Exam:   General: alert, cooperative and no distress Lochia: appropriate Uterine Fundus: firm, U/-1 Incision: healing well, no significant drainage DVT Evaluation: No evidence of DVT seen on physical exam. Negative Homan's sign. Calf/Ankle edema is present.  Intrapartum Procedures: cesarean: low cervical, transverse Postpartum Procedures: none Complications-Operative and Postpartum: none  Discharge Diagnoses: S/P Preterm delivery by Primary LTCS, Hypertesion  Discharge Information:  Activity:           pelvic rest Diet:                routine Medications: Ibuprofen, Colace and Percocet Condition:      stable Instructions:  Care After Cesarean Delivery  Refer to this sheet in the next few weeks. These instructions provide you with information on caring for yourself after your procedure. Your caregiver may also give you specific instructions. Your treatment has been planned according to current medical practices, but problems sometimes occur. Call your caregiver if you have any problems or questions after you go home. HOME CARE INSTRUCTIONS  Only take over-the-counter or prescription medicines as directed by your caregiver.  Do not drink alcohol, especially if you are breastfeeding or taking medicine to relieve pain.  Do not chew or smoke tobacco.  Continue to use good perineal care. Good perineal care includes:  Wiping your perineum from front to back.  Keeping your perineum clean.  Check your cut (incision) daily for increased redness, drainage, swelling, or separation of skin.  Clean your incision gently with soap and water every day, and  then pat it dry. If your caregiver says it is okay, leave the incision uncovered. Use a bandage (dressing) if the incision is draining fluid or appears irritated. If the adhesive strips across the incision do not fall off within 7 days, carefully peel them off.  Hug a pillow when coughing or sneezing until your incision is healed. This helps to relieve pain.  Do not use tampons or douche until your caregiver says it is okay.  Shower, wash your hair, and take tub baths as directed by your caregiver.  Wear a well-fitting bra that provides breast support.  Limit wearing support panties or control-top hose.  Drink enough fluids to keep your urine clear or pale yellow.  Eat high-fiber foods such as whole grain cereals and breads, brown rice, beans, and fresh fruits and vegetables every day. These foods may help prevent or relieve constipation.  Resume activities such as climbing stairs, driving, lifting, exercising, or traveling as directed by your caregiver.  Talk to your caregiver about resuming sexual activities. This is dependent upon your risk of infection, your rate of healing, and your comfort and desire to resume sexual activity.  Try to have someone help you with your household activities and your newborn for at least a few days after you leave the hospital.  Rest as much as possible. Try to rest or take a nap when your newborn is sleeping.  Increase your activities gradually.  Keep all of your scheduled postpartum appointments. It is very important to keep your scheduled follow-up appointments. At these appointments, your caregiver will be checking to make sure that you are healing physically and emotionally. SEEK MEDICAL CARE IF:   You are passing large clots from your vagina. Save any clots to show your caregiver.  You have a foul smelling discharge from your vagina.  You have trouble urinating.  You are urinating frequently.  You have pain when you urinate.  You have a  change in your bowel movements.  You have increasing redness, pain, or swelling near your incision.  You have pus draining from your incision.  Your incision is separating.  You have painful, hard, or reddened breasts.  You have a severe headache.  You have blurred vision or see spots.  You feel sad or depressed.  You have thoughts of hurting yourself or your newborn.  You have questions about your care, the care of your newborn, or medicines.  You are dizzy or lightheaded.  You have a rash.  You have pain, redness, or swelling at the site of the removed intravenous access (IV) tube.  You have nausea or vomiting.  You stopped breastfeeding and have not had a menstrual period within 12 weeks of stopping.  You are not breastfeeding and have not had a menstrual period within 12 weeks of delivery.  You have a fever. SEEK IMMEDIATE MEDICAL CARE IF:  You have persistent pain.  You have chest pain.  You have shortness of breath.  You faint.  You have leg pain.  You have stomach pain.  Your vaginal bleeding saturates  2 or more sanitary pads in 1 hour. MAKE SURE YOU:   Understand these instructions.  Will watch your condition.  Will get help right away if you are not doing well or get worse. Document Released: 06/25/2002 Document Revised: 06/27/2012 Document Reviewed: 05/30/2012 [facility] Patient Information 2014 [place], [place].   Postpartum Depression and Baby Blues  The postpartum period begins right after the birth of a baby. During this time, there is often a great amount of joy and excitement. It is also a time of considerable changes in the life of the parent(s). Regardless of how many times a mother gives birth, each child brings new challenges and dynamics to the family. It is not unusual to have feelings of excitement accompanied by confusing shifts in moods, emotions, and thoughts. All mothers are at risk of developing postpartum depression or the "baby  blues." These mood changes can occur right after giving birth, or they may occur many months after giving birth. The baby blues or postpartum depression can be mild or severe. Additionally, postpartum depression can resolve rather quickly, or it can be a long-term condition. CAUSES Elevated hormones and their rapid decline are thought to be a main cause of postpartum depression and the baby blues. There are a number of hormones that radically change during and after pregnancy. Estrogen and progesterone usually decrease immediately after delivering your baby. The level of thyroid hormone and various cortisol steroids also rapidly drop. Other factors that play a major role in these changes include major life events and genetics.  RISK FACTORS If you have any of the following risks for the baby blues or postpartum depression, know what symptoms to watch out for during the postpartum period. Risk factors that may increase the likelihood of getting the baby blues or postpartum depression include:  Havinga personal or family history of depression.  Having depression while being pregnant.  Having premenstrual or oral contraceptive-associated mood issues.  Having exceptional life stress.  Having marital conflict.  Lacking a social support network.  Having a baby with special needs.  Having health problems such as diabetes. SYMPTOMS Baby blues symptoms include:  Brief fluctuations in mood, such as going from extreme happiness to sadness.  Decreased concentration.  Difficulty sleeping.  Crying spells, tearfulness.  Irritability.  Anxiety. Postpartum depression symptoms typically begin within the first month after giving birth. These symptoms include:  Difficulty sleeping or excessive sleepiness.  Marked weight loss.  Agitation.  Feelings of worthlessness.  Lack of interest in activity or food. Postpartum psychosis is a very concerning condition and can be dangerous. Fortunately,  it is rare. Displaying any of the following symptoms is cause for immediate medical attention. Postpartum psychosis symptoms include:  Hallucinations and delusions.  Bizarre or disorganized behavior.  Confusion or disorientation. DIAGNOSIS  A diagnosis is made by an evaluation of your symptoms. There are no medical or lab tests that lead to a diagnosis, but there are various questionnaires that a caregiver may use to identify those with the baby blues, postpartum depression, or psychosis. Often times, a screening tool called the [place] Postnatal Depression Scale is used to diagnose depression in the postpartum period.  TREATMENT The baby blues usually goes away on its own in 1 to 2 weeks. Social support is often all that is needed. You should be encouraged to get adequate sleep and rest. Occasionally, you may be given medicines to help you sleep.  Postpartum depression requires treatment as it can last several months or longer if it is  not treated. Treatment may include individual or group therapy, medicine, or both to address any social, physiological, and psychological factors that may play a role in the depression. Regular exercise, a healthy diet, rest, and social support may also be strongly recommended.  Postpartum psychosis is more serious and needs treatment right away. Hospitalization is often needed. HOME CARE INSTRUCTIONS  Get as much rest as you can. Nap when the baby sleeps.  Exercise regularly. Some women find yoga and walking to be beneficial.  Eat a balanced and nourishing diet.  Do little things that you enjoy. Have a cup of tea, take a bubble bath, read your favorite magazine, or listen to your favorite music.  Avoid alcohol.  Ask for help with household chores, cooking, grocery shopping, or running errands as needed. Do not try to do everything.  Talk to people close to you about how you are feeling. Get support from your partner, family members, friends, or other new  moms.  Try to stay positive in how you think. Think about the things you are grateful for.  Do not spend a lot of time alone.  Only take medicine as directed by your caregiver.  Keep all your postpartum appointments.  Let your caregiver know if you have any concerns. SEEK MEDICAL CARE IF: You are having a reaction or problems with your medicine. SEEK IMMEDIATE MEDICAL CARE IF:  You have suicidal feelings.  You feel you may harm the baby or someone else. Document Released: 07/07/2004 Document Revised: 12/26/2011 Document Reviewed: 08/09/2011 [facility] Patient Information 2014 [place], [place].  Discharge to: home  Follow-up Information   [facility]. Schedule an appointment as soon as possible for a visit in 6 weeks. (Please call if you have any questions or concerns prior to your next visit. [facility] Nurse to follow up for bp.)    Specialty:  Obstetrics and Gynecology   Contact information:   [address] [place] [phone]       Gerrit HeckJessica Rori Goar , CNM, MSN  02/03/2014

## 2014-02-07 NOTE — Addendum Note (Signed)
Addendum created 02/07/14 1111 by Dana AllanAmy Kevork Joyce, MD   Modules edited: Anesthesia Attestations

## 2014-02-17 ENCOUNTER — Ambulatory Visit (HOSPITAL_COMMUNITY): Admission: RE | Admit: 2014-02-17 | Payer: 59 | Source: Ambulatory Visit

## 2014-02-20 ENCOUNTER — Other Ambulatory Visit: Payer: Self-pay | Admitting: Internal Medicine

## 2014-02-21 ENCOUNTER — Other Ambulatory Visit: Payer: Self-pay | Admitting: Internal Medicine

## 2014-02-21 ENCOUNTER — Ambulatory Visit (HOSPITAL_COMMUNITY)
Admission: RE | Admit: 2014-02-21 | Discharge: 2014-02-21 | Disposition: A | Discharge status: Home / Self Care | Payer: 59 | Source: Ambulatory Visit | Attending: Obstetrics and Gynecology | Admitting: Obstetrics and Gynecology

## 2014-02-21 NOTE — Telephone Encounter (Signed)
Should the patient take this while breastfeeding?

## 2014-02-21 NOTE — Telephone Encounter (Signed)
Patient aware of WP's advise.  Medication sent per the patient's request.

## 2014-02-21 NOTE — Telephone Encounter (Signed)
Ok to refill but not advised in breast feeding  Send this information to patient as in UTD  the amount of sumatriptan an infant would be exposed to following breast-feeding is considered to be small (although the mean milk-to-plasma ratio is ~4.9, weight-adjusted doses estimates suggest breast-fed infants receive 3.5% of a maternal dose). Expressing and discarding the milk for 8-12 hours after a single dose is suggested to reduce the amount present even further (Wojnar-Horton, 1996). Breast-feeding is not recommended by some manufacturers; however, according to other sources if treatment is needed, breast-feeding does not need to be discontinued Garnet Koyanagi(Jrgens, 2009; [place], 2012).

## 2014-02-21 NOTE — Lactation Note (Signed)
Adult Lactation Consultation Outpatient Visit Note  Patient Name: Sandra Hudson                                Sandra Hudson born at 36.1 wks.  Date of Birth: 03/06/1976                                         DOB [birth year], now 623 weeks old Gestational Age at Delivery: Unknown                   BW 7 lb. 2.1 oz.  Type of Delivery: C/S  Breastfeeding History: Frequency of Breastfeeding: for the past week, BF 1-2 times per day Length of Feeding:  Voids: 6+/day Stools:  3-4 per day, yellow/green in color  Supplementing / Method: Pumping:  Type of Pump:  Medela PNS   Frequency:  Every 3 hours for 20 minutes  Volume:  3-4 oz each breast for total of 6-8 oz.  Comments: Mom is here for feeding assessment. She had been latching Sandra Hudson every feeding then supplementing till last week. Last week she decided to limit breastfeeding to limit calorie usage with feedings to "let Sandra grow". She has been supplementing each feeding, every 3 hours, with 60 ml of EBM using Dr. Manson PasseyBrown bottle with preemie nipple. Mom does not want to use SNS at the breast to supplement. She has tried this in the past and found it to be overwhelming.  Mom used nipple shield yesterday, size 24 to latch Sandra but Cherlynn PoloBaby Hudson is not latching well with nipple shield. Mom reports Cherlynn PoloBaby Hudson will take few suckles then fall asleep, with or without the nipple shield. She has used curved tipped syringe few times to pre-load the nipple shield to help her develop a suckling pattern but unless she is getting something continuously she will not sustain a suckling pattern per Mom.    Consultation Evaluation: Sandra is noted to have labial frenulum down to lower gum line, however this appears to be stretchable. Mom's nipple are erect however with attempting to latch Sandra she could not organize her suck. Changed Mom to size 20 nipple shield for better fit and Sandra was able to obtain latch. Worked with Mom with positioning to sustain depth,  pre-loaded the nipple shield with some EBM via curved tipped syringe. Once this was gone Sandra stop suckling and wanted to sleep. LC continued to give EBM via curved tipped syringe in the corner of Sandra's mouth and she took approx 5 ml from each breast. This was the only way she would continue to suckle at the breast. When Sandra came off the left breast there was some breast milk in the nipple shield that Sandra had transferred. Mom does not want to use SNS or 5 fr feeding tube system to supplement at breast.   Initial Feeding Assessment: Pre-feed Weight:   7 lb. 8.1 oz/3406 gm Post-feed Weight:  7 lb. 8.4 oz/3412 gm.  Amount Transferred:  6 ml.  At this feeding Sandra transferred 1 ml from breast and took 5 ml from curved tipped syringe at breast for the total of 6 ml.  Comments:  Using #20 nipple shield on right breast in foot ball hold. Worked with Mom with positioning and obtaining good depth, keeping lips flanged. However Sandra would do  much better if Mom could use SNS.Marland Kitchen.  Additional Feeding Assessment: Pre-feed Weight:  7 lb. 8.4 oz/3412 gm Post-feed Weight:  7 lb. 8.8 oz/3424 gm Amount Transferred:  12 ml. At this breast Sandra transferred 7 ml of breast milk and took 5 ml of EBM via curved tipped syringe for the total of 12 ml.  Comments: Again Sandra would suckle if had continuous flow of milk. Sandra suckled off and on for 20 minutes then was sleepy and would not continue to BF.   Additional Feeding Assessment: Pre-feed Weight: Post-feed Weight: Amount Transferred: Comments:  Total Breast milk Transferred this Visit: 18 ml  Total Supplement Given: 10 ml via bottle after breastfeeding for total intake of 28 ml. Sandra tired after working with us at this visit, Mom reports she will feed Sandra again when she gets home.   Additional Interventions: Plan given to Mom: Pre-pump for 3-5 minutes to get her milk flow going or past 1st milk ejection. Use #20 nipple shield to help with latch. Pre-load as  needed to help Sandra develop suckling pattern. Try to BF each breast 15 minutes each feeding every 3 hours or more frequent if Sandra hungry. Limit feedings to 30 minutes to conserve energy/calories at the breast. If Sandra is too sleepy to take supplement with nursing for 30 minutes, then BF on 1 breast for 15 minutes each feeding, alternating between each breast, each feeding, then supplement. Supplement with 60-90 ml of EBM with each feeding. Try to getting Sandra to increase to 90 ml with feedings.  Continue to post pump after feedings to protect milk supply and have EBM to supplement If Sandra will not latch after 5-10 minutes, then just pump and bottle feed and try again next feeding.  Mom may try pumping 1 breast while Sandra is nursing on other breast to increase milk flow to keep Sandra stimulated at the breast.  Pump and bottle at night.   Follow-Up Lactation OP f/u Friday, May 22nd at 10:30pm     [name] 02/21/2014, 6:55 PM

## 2014-02-24 NOTE — Telephone Encounter (Signed)
Patient is nursing.  Please advise.  Thanks!

## 2014-02-26 NOTE — Telephone Encounter (Signed)
Ok to refill x 90 days hope she has appt with me when possible can work her in.

## 2014-02-27 ENCOUNTER — Inpatient Hospital Stay (HOSPITAL_COMMUNITY): Admit: 2014-02-27 | Payer: Self-pay | Admitting: Obstetrics and Gynecology

## 2014-02-27 ENCOUNTER — Inpatient Hospital Stay (HOSPITAL_COMMUNITY): Admission: AD | Admit: 2014-02-27 | Payer: 59 | Source: Ambulatory Visit | Admitting: Obstetrics and Gynecology

## 2014-03-07 ENCOUNTER — Ambulatory Visit (HOSPITAL_COMMUNITY)
Admission: RE | Admit: 2014-03-07 | Discharge: 2014-03-07 | Disposition: A | Discharge status: Home / Self Care | Payer: 59 | Source: Ambulatory Visit | Attending: Internal Medicine | Admitting: Internal Medicine

## 2014-03-07 NOTE — Lactation Note (Signed)
Infant Lactation Consultation Outpatient Visit Note  Mom desires Sandra Hudson to BF more often. Today she transferred 56 ml  from the breast using a # 20 NS but had to be stimulated for the entire feeding to stay awake and eat.  She fatigues while feeding and some muscle quivering was noted.  Oral assessment:  "bubble"  palate anteriorly, posterior frenum is hard to visualize unless tongue is manually elevated, tongue does not extend past the lower alveolar ridge when mouth is open. upper labial frenum is wide and inserted just above the alveolar ridge, difficulty maintaining a vacuum when gloved finger is inserted deeply into the mouth.  Hyperactive gag reflex noted. Mom given information on Dr Arlys John McMurtry's website so she could gather more information.  Mom plans to do more BF this weekend and will come to support group for a weight check Monday or Tuesday.  I asked her to supplement with 1 ounce after feeding because weight gain was 8-9 ounces in the past 14 days.  Mom is considering pumping and bottle feeding.  Patient Name: Sandra Hudson Date of Birth: 02-Jul-1976 Birth Weight:  7+2 today weight is 8+1.9 Gestational Age at Delivery: Gestational Age: <None> Type of Delivery:   Breastfeeding History Frequency of Breastfeeding: 3-4 times a day.   Length of Feeding: 60 min Voids: 6+ Stools: 4 +   Supplementing / Method: Pumping:  Type of Pump:pump in style   Frequency:pumping 4-5 times after BF. Yield is 1-2 oz. Pumps 3-4 oz once overnight and sometimes during the day.   Comments:    Consultation Evaluation:  Initial Feeding Assessment: Pre-feed NIDPOE:4235 Post-feed TIRWER:1540 Amount Transferred:56 Comments:Had to be stimulated throughout the feeding    Total Breast milk Transferred this Visit:  Total Supplement Given: 15 ml expressed BM just prior to leaving consult  Additional Interventions:   Follow-Up      Soyla Dryer 03/07/2014, 10:38 AM

## 2014-05-02 ENCOUNTER — Other Ambulatory Visit: Payer: Self-pay | Admitting: Internal Medicine

## 2014-05-02 NOTE — Telephone Encounter (Signed)
Ok to refill x 6 months 

## 2014-05-06 NOTE — Telephone Encounter (Signed)
2nd re-fill request was received on the below Rx

## 2014-05-06 NOTE — Telephone Encounter (Signed)
Sent to the pharmacy by e-scribe. 

## 2014-08-06 ENCOUNTER — Ambulatory Visit (INDEPENDENT_AMBULATORY_CARE_PROVIDER_SITE_OTHER): Payer: 59

## 2014-08-06 DIAGNOSIS — Z23 Encounter for immunization: Secondary | ICD-10-CM

## 2014-08-13 ENCOUNTER — Other Ambulatory Visit: Payer: Self-pay | Admitting: Internal Medicine

## 2014-08-13 NOTE — Telephone Encounter (Signed)
Called and left a message on home phone for Sandra Hudson to call and make a follow up appt with WP.  Pt is past due.  Rx sent to the pharmacy for 30 days.

## 2014-08-18 ENCOUNTER — Encounter (HOSPITAL_COMMUNITY): Payer: Self-pay | Admitting: Obstetrics and Gynecology

## 2014-09-09 ENCOUNTER — Ambulatory Visit (INDEPENDENT_AMBULATORY_CARE_PROVIDER_SITE_OTHER): Payer: BC Managed Care – PPO | Admitting: Internal Medicine

## 2014-09-09 ENCOUNTER — Encounter: Payer: Self-pay | Admitting: Internal Medicine

## 2014-09-09 VITALS — BP 112/70 | Temp 98.5°F | Ht 68.0 in | Wt 205.0 lb

## 2014-09-09 DIAGNOSIS — D649 Anemia, unspecified: Secondary | ICD-10-CM

## 2014-09-09 DIAGNOSIS — R739 Hyperglycemia, unspecified: Secondary | ICD-10-CM

## 2014-09-09 DIAGNOSIS — F4321 Adjustment disorder with depressed mood: Secondary | ICD-10-CM

## 2014-09-09 DIAGNOSIS — E871 Hypo-osmolality and hyponatremia: Secondary | ICD-10-CM

## 2014-09-09 DIAGNOSIS — Z79899 Other long term (current) drug therapy: Secondary | ICD-10-CM

## 2014-09-09 DIAGNOSIS — G4700 Insomnia, unspecified: Secondary | ICD-10-CM

## 2014-09-09 LAB — BASIC METABOLIC PANEL
BUN: 18 mg/dL (ref 6–23)
CHLORIDE: 104 meq/L (ref 96–112)
CO2: 26 mEq/L (ref 19–32)
CREATININE: 0.9 mg/dL (ref 0.4–1.2)
Calcium: 9.5 mg/dL (ref 8.4–10.5)
GFR: 74.9 mL/min (ref >60.00)
GLUCOSE: 71 mg/dL (ref 70–99)
POTASSIUM: 4.5 meq/L (ref 3.5–5.1)
Sodium: 143 mEq/L (ref 135–145)

## 2014-09-09 LAB — CBC WITH DIFFERENTIAL/PLATELET
BASOS ABS: 0 10*3/uL (ref 0.0–0.1)
Basophils Relative: 0.7 % (ref 0.0–3.0)
EOS ABS: 0.2 10*3/uL (ref 0.0–0.7)
Eosinophils Relative: 2.5 % (ref 0.0–5.0)
HEMATOCRIT: 41 % (ref 36.0–46.0)
Hemoglobin: 13.7 g/dL (ref 12.0–15.0)
LYMPHS ABS: 2.5 10*3/uL (ref 0.7–4.0)
Lymphocytes Relative: 39.5 % (ref 12.0–46.0)
MCHC: 33.4 g/dL (ref 30.0–36.0)
MCV: 85.7 fl (ref 78.0–100.0)
MONO ABS: 0.4 10*3/uL (ref 0.1–1.0)
Monocytes Relative: 5.5 % (ref 3.0–12.0)
NEUTROS PCT: 51.8 % (ref 43.0–77.0)
Neutro Abs: 3.3 10*3/uL (ref 1.4–7.7)
PLATELETS: 302 10*3/uL (ref 150.0–400.0)
RBC: 4.78 Mil/uL (ref 3.87–5.11)
RDW: 13.4 % (ref 11.5–15.5)
WBC: 6.4 10*3/uL (ref 4.0–10.5)

## 2014-09-09 LAB — TSH: TSH: 2.53 u[IU]/mL (ref 0.35–4.50)

## 2014-09-09 LAB — HEMOGLOBIN A1C: HEMOGLOBIN A1C: 6.1 % (ref 4.6–6.5)

## 2014-09-09 MED ORDER — ZOLPIDEM TARTRATE 5 MG PO TABS: 5.0000 mg | ORAL_TABLET | Freq: Every evening | ORAL | Status: DC | PRN

## 2014-09-09 MED ORDER — BUPROPION HCL ER (XL) 300 MG PO TB24: 300.0000 mg | ORAL_TABLET | Freq: Every morning | ORAL | Status: DC

## 2014-09-09 MED ORDER — DULOXETINE HCL 60 MG PO CPEP: ORAL_CAPSULE | ORAL | Status: DC

## 2014-09-09 NOTE — Progress Notes (Signed)
Pre visit review using our clinic review tool, if applicable. No additional management support is needed unless otherwise documented below in the visit note.  Chief Complaint  Patient presents with  . Follow-up    medications      HPI: Sandra Hudson 38 y.o.  Comes in for fu of med management   She has continue wellbutrin and cymbalta  And feels has helped her   Is 7 months Post partum managing infant and   Small child   Off bp meds in preg and controlled . Still lactating and pumping .    HAs  Seem stable   Had iud and felt caused ha to be worse so now on no hormonal contraception and feels better  stil taking small amount of ambien  10 mg given by her obgyne  And feels this is too strong but needs some help for sleep . Plans on weaning   ROS: See pertinent positives and negatives per HPI.  Past Medical History  Diagnosis Date  . Allergy   . Headache(784.0)   . Female infertility   . Allergic rhinitis due to dust   . Pregnancy induced hypertension   . Pre-eclampsia 01/29/2014    Family History  Problem Relation Age of Onset  . Depression Father   . Depression Mother   . Breast cancer      maternal great aunt  . Breast cancer Paternal Grandmother   . ALS Father     multiple system atropy    History   Social History  . Marital Status: Married    Spouse Name: N/A    Number of Children: 1  . Years of Education: N/A   Occupational History  .     Social History Main Topics  . Smoking status: Never Smoker   . Smokeless tobacco: Never Used  . Alcohol Use: No     Comment: occasionally  . Drug Use: No  . Sexual Activity: None   Other Topics Concern  . 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    Outpatient Encounter Prescriptions as of 09/09/2014  Medication Sig  . buPROPion (WELLBUTRIN XL) 300 MG 24 hr tablet Take 1 tablet (300 mg total) by mouth every  morning.  . DULoxetine (CYMBALTA) 60 MG capsule Take 1 capsule by mouth  daily  . SUMAtriptan (IMITREX) 100 MG tablet Take 1 tablet and repeat in two hours if needed.  . [DISCONTINUED] buPROPion (WELLBUTRIN XL) 300 MG 24 hr tablet take 1 tablet by mouth every morning  . [DISCONTINUED] DULoxetine (CYMBALTA) 60 MG capsule Take 1 capsule by mouth  daily  . [DISCONTINUED] zolpidem (AMBIEN) 10 MG tablet Take 10 mg by mouth at bedtime.   Marland Kitchen. zolpidem (AMBIEN) 5 MG tablet Take 1 tablet (5 mg total) by mouth at bedtime as needed for sleep.  . [DISCONTINUED] ferrous sulfate 325 (65 FE) MG tablet Take 1 tablet (325 mg total) by mouth daily with breakfast.  . [DISCONTINUED] ibuprofen (ADVIL,MOTRIN) 600 MG tablet Take 1 tablet (600 mg total) by mouth every 6 (six) hours.  . [DISCONTINUED] labetalol (NORMODYNE) 100 MG tablet Take 2 tablets (200 mg total) by mouth 2 (two) times daily.  . [DISCONTINUED] oxyCODONE-acetaminophen (PERCOCET/ROXICET) 5-325 MG per tablet Take 1-2 tablets by mouth every 4 (four) hours as needed  for severe pain (moderate - severe pain).    EXAM:  BP 112/70 mmHg  Temp(Src) 98.5 F (36.9 C) (Oral)  Ht 5\' 8"  (1.727 m)  Wt 205 lb (92.987 kg)  BMI 31.18 kg/m2  Body mass index is 31.18 kg/(m^2).  GENERAL: vitals reviewed and listed above, alert, oriented, appears well hydrated and in no acute distress HEENT: atraumatic, conjunctiva  clear, no obvious abnormalities on inspection of external nose and ears OP : no lesion edema or exudate  NECK: no obvious masses on inspection palpation  LUNGS: clear to auscultation bilaterally, no wheezes, rales or rhonchi, good air movement CV: HRRR, no clubbing cyanosis or  peripheral edema nl cap refill  MS: moves all extremities without noticeable focal  abnormality PSYCH: pleasant and cooperative, no obvious depression or anxiety  BP Readings from Last 3 Encounters:  09/09/14 112/70  02/03/14 142/94  12/17/13 116/70   Wt Readings from Last 3  Encounters:  09/09/14 205 lb (92.987 kg)  02/02/14 236 lb 9.6 oz (107.321 kg)  12/17/13 223 lb (101.152 kg)     ASSESSMENT AND PLAN:  Discussed the following assessment and plan:  Hyperglycemia  Medication management - Plan: Basic metabolic panel, CBC with Differential, Hemoglobin A1c, TSH  Adjustment disorder with depressed mood - stable  treated   Anemia, unspecified anemia type - Plan: Basic metabolic panel, CBC with Differential, Hemoglobin A1c, TSH  Low sodium levels - Plan: Basic metabolic panel, CBC with Differential, Hemoglobin A1c, TSH  Insomnia - was on meds through pred  dec to 5 mg wean as tolerated  refill med today  Anemia and low sodium levels at CB repeat  To confirm normalcy .  If ok then lsi  And rov in 6 months  If labs ok  Milus HeightWean ambien as possible  Benefit risk discussed but doing ok at this time -Patient advised to return or notify health care team  if symptoms worsen ,persist or new concerns arise.  Patient Instructions  Will notify you  of labs when available. To check for blood sugar and anemia and    Sodium levels . ambien  5 mg    No other changes .    ROV depending on  How you are doing      Neta [name]. Panosh M.D.  Lab Results  Component Value Date   WBC 6.4 09/09/2014   HGB 13.7 09/09/2014   HCT 41.0 09/09/2014   PLT 302.0 09/09/2014   GLUCOSE 71 09/09/2014   CHOL 235* 11/06/2012   TRIG 131.0 11/06/2012   HDL 43.90 11/06/2012   LDLDIRECT 158.0 11/06/2012   LDLCALC 118* 08/31/2009   ALT 11 02/01/2014   AST 20 02/01/2014   NA 143 09/09/2014   K 4.5 09/09/2014   CL 104 09/09/2014   CREATININE 0.9 09/09/2014   BUN 18 09/09/2014   CO2 26 09/09/2014   TSH 2.53 09/09/2014   HGBA1C 6.1 09/09/2014   MICROALBUR 1.1 09/15/2010

## 2014-09-09 NOTE — Patient Instructions (Signed)
Will notify you  of labs when available. To check for blood sugar and anemia and    Sodium levels . ambien  5 mg    No other changes .    ROV depending on  How you are doing

## 2014-09-11 ENCOUNTER — Encounter: Payer: Self-pay | Admitting: Internal Medicine

## 2014-09-11 DIAGNOSIS — G4700 Insomnia, unspecified: Secondary | ICD-10-CM | POA: Insufficient documentation

## 2014-09-11 DIAGNOSIS — Z79899 Other long term (current) drug therapy: Secondary | ICD-10-CM | POA: Insufficient documentation

## 2014-10-07 ENCOUNTER — Other Ambulatory Visit: Payer: Self-pay | Admitting: Internal Medicine

## 2014-10-08 NOTE — Telephone Encounter (Signed)
Ok to refill x 6 months 

## 2014-10-21 ENCOUNTER — Other Ambulatory Visit: Payer: Self-pay | Admitting: Internal Medicine

## 2014-10-22 NOTE — Telephone Encounter (Signed)
Sent to the pharmacy by e-scribe. 

## 2014-10-22 NOTE — Telephone Encounter (Signed)
Ok to refill x 2  

## 2015-02-03 ENCOUNTER — Encounter: Payer: Self-pay | Admitting: Internal Medicine

## 2015-02-03 ENCOUNTER — Ambulatory Visit (INDEPENDENT_AMBULATORY_CARE_PROVIDER_SITE_OTHER): Payer: BLUE CROSS/BLUE SHIELD | Admitting: Internal Medicine

## 2015-02-03 VITALS — BP 122/80 | Temp 95.6°F | Ht 68.0 in | Wt 207.0 lb

## 2015-02-03 DIAGNOSIS — R51 Headache: Secondary | ICD-10-CM | POA: Diagnosis not present

## 2015-02-03 DIAGNOSIS — Z79899 Other long term (current) drug therapy: Secondary | ICD-10-CM

## 2015-02-03 DIAGNOSIS — F4321 Adjustment disorder with depressed mood: Secondary | ICD-10-CM

## 2015-02-03 DIAGNOSIS — R519 Headache, unspecified: Secondary | ICD-10-CM

## 2015-02-03 NOTE — Progress Notes (Signed)
Pre visit review using our clinic review tool, if applicable. No additional management support is needed unless otherwise documented below in the visit note.  Chief Complaint  Patient presents with  . Follow-up    HPI: Sandra Hudson 39 y.o. comesin with infant to day for her med evaluation. Mood ok some irritabilty related to 2 small children interruption  Doing things  Sleep ok ocass  Rare iuse of ambien .  Some disruption with 2 small children  HAs not that often   imitrex as needed about once a month . Or 2 x .    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 Neg tad rare etoh  Past Medical History  Diagnosis Date  . Allergy   . Headache(784.0)   . Female infertility   . Allergic rhinitis due to dust   . Pregnancy induced hypertension   . Pre-eclampsia 01/29/2014    Family History  Problem Relation Age of Onset  . Depression Father   . Depression Mother   . Breast cancer      maternal great aunt  . Breast cancer Paternal Grandmother   . ALS Father     multiple system atropy    History   Social History  . Marital Status: Married    Spouse Name: N/A  . Number of Children: 1  . Years of Education: N/A   Occupational History  .     Social History Main Topics  . Smoking status: Never Smoker   . Smokeless tobacco: Never Used  . Alcohol Use: No     Comment: occasionally  . Drug Use: No  . Sexual Activity: Not on file   Other Topics Concern  . 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    Outpatient Encounter Prescriptions as of 02/03/2015  Medication Sig  . buPROPion (WELLBUTRIN XL) 300 MG 24 hr tablet Take 1 tablet (300 mg total) by mouth every morning.  . DULoxetine (CYMBALTA) 60 MG capsule Take 1 capsule by mouth  daily  . SUMAtriptan (IMITREX) 100 MG tablet take 1 tablet by mouth REPEAT IN 2 HOURS  IF NEEDED  . zolpidem (AMBIEN) 5 MG tablet Take 1 tablet (5 mg total) by mouth at bedtime as needed for sleep.    EXAM:  BP 122/80 mmHg  Temp(Src) 95.6 F (35.3 C) (Temporal)  Ht 5\' 8"  (1.727 m)  Wt 207 lb (93.895 kg)  BMI 31.48 kg/m2  Body mass index is 31.48 kg/(m^2).  GENERAL: vitals reviewed and listed above, alert, oriented, appears well hydrated and in no acute distress HEENT: atraumatic, conjunctiva  clear, no obvious abnormalities on inspection of external nose and ears   NECK: no obvious masses on inspection palpation  LUNGS: clear to auscultation bilaterally, no wheezes, rales or rhonchi, good air movement CV: HRRR, no clubbing cyanosis or  peripheral edema nl cap refill  MS: moves all extremities without noticeable focal  abnormality PSYCH: pleasant and cooperative, no obvious depression or anxiety good interaction with   Her 38 year old  Lab Results  Component Value Date   WBC 6.4 09/09/2014   HGB 13.7 09/09/2014   HCT 41.0 09/09/2014   PLT 302.0 09/09/2014   GLUCOSE 71 09/09/2014   CHOL 235*  11/06/2012   TRIG 131.0 11/06/2012   HDL 43.90 11/06/2012   LDLDIRECT 158.0 11/06/2012   LDLCALC 118* 08/31/2009   ALT 11 02/01/2014   AST 20 02/01/2014   NA 143 09/09/2014   K 4.5 09/09/2014   CL 104 09/09/2014   CREATININE 0.9 09/09/2014   BUN 18 09/09/2014   CO2 26 09/09/2014   TSH 2.53 09/09/2014   HGBA1C 6.1 09/09/2014   MICROALBUR 1.1 09/15/2010   Wt Readings from Last 3 Encounters:  02/03/15 207 lb (93.895 kg)  09/09/14 205 lb (92.987 kg)  02/02/14 236 lb 9.6 oz (107.321 kg)  \  ASSESSMENT AND PLAN:  Discussed the following assessment and plan:  Recurrent headache - migraine controlled   Adjustment disorder with depressed mood  Medication management  -Patient advised to return or notify health care team  if symptoms worsen ,persist or new concerns arise.  Patient Instructions  Continue same meds  At this time.   weight watchers as  possible. Wellness and  Lab with a1c in about 6 months  Have pharmacy contact us for refills.   Neta Mends. Drinda Belgard M.D.

## 2015-02-03 NOTE — Patient Instructions (Addendum)
Continue same meds  At this time.   weight watchers as possible. Wellness and  Lab with a1c in about 6 months  Have pharmacy contact us for refills.

## 2015-05-11 ENCOUNTER — Other Ambulatory Visit: Payer: Self-pay | Admitting: Internal Medicine

## 2015-05-12 NOTE — Telephone Encounter (Signed)
Can refill x 1 ( #30 )

## 2015-05-12 NOTE — Telephone Encounter (Signed)
Called to the pharmacy and left on machine. 

## 2015-06-25 ENCOUNTER — Other Ambulatory Visit: Payer: Self-pay | Admitting: Internal Medicine

## 2015-08-14 ENCOUNTER — Other Ambulatory Visit (INDEPENDENT_AMBULATORY_CARE_PROVIDER_SITE_OTHER): Payer: BLUE CROSS/BLUE SHIELD

## 2015-08-14 DIAGNOSIS — Z0000 Encounter for general adult medical examination without abnormal findings: Secondary | ICD-10-CM

## 2015-08-14 LAB — CBC WITH DIFFERENTIAL/PLATELET
Basophils Absolute: 0 10*3/uL (ref 0.0–0.1)
Basophils Relative: 0.7 % (ref 0.0–3.0)
Eosinophils Absolute: 0.2 10*3/uL (ref 0.0–0.7)
Eosinophils Relative: 3 % (ref 0.0–5.0)
HEMATOCRIT: 41 % (ref 36.0–46.0)
Hemoglobin: 13.7 g/dL (ref 12.0–15.0)
Lymphocytes Relative: 36.9 % (ref 12.0–46.0)
Lymphs Abs: 2 10*3/uL (ref 0.7–4.0)
MCHC: 33.4 g/dL (ref 30.0–36.0)
MCV: 85.4 fl (ref 78.0–100.0)
MONOS PCT: 5.9 % (ref 3.0–12.0)
Monocytes Absolute: 0.3 10*3/uL (ref 0.1–1.0)
Neutro Abs: 2.9 10*3/uL (ref 1.4–7.7)
Neutrophils Relative %: 53.5 % (ref 43.0–77.0)
Platelets: 298 10*3/uL (ref 150.0–400.0)
RBC: 4.81 Mil/uL (ref 3.87–5.11)
RDW: 13.4 % (ref 11.5–15.5)
WBC: 5.4 10*3/uL (ref 4.0–10.5)

## 2015-08-14 LAB — HEPATIC FUNCTION PANEL
ALBUMIN: 3.9 g/dL (ref 3.5–5.2)
ALT: 18 U/L (ref 0–35)
AST: 16 U/L (ref 0–37)
Alkaline Phosphatase: 49 U/L (ref 39–117)
Bilirubin, Direct: 0 mg/dL (ref 0.0–0.3)
Total Bilirubin: 0.3 mg/dL (ref 0.2–1.2)
Total Protein: 7.3 g/dL (ref 6.0–8.3)

## 2015-08-14 LAB — LIPID PANEL
CHOLESTEROL: 193 mg/dL (ref 0–200)
HDL: 44.8 mg/dL (ref >39.00)
LDL Cholesterol: 122 mg/dL — ABNORMAL HIGH (ref 0–99)
NONHDL: 148.48
TRIGLYCERIDES: 134 mg/dL (ref 0.0–149.0)
Total CHOL/HDL Ratio: 4
VLDL: 26.8 mg/dL (ref 0.0–40.0)

## 2015-08-14 LAB — BASIC METABOLIC PANEL
BUN: 18 mg/dL (ref 6–23)
CALCIUM: 9.1 mg/dL (ref 8.4–10.5)
CO2: 25 mEq/L (ref 19–32)
CREATININE: 1.1 mg/dL (ref 0.40–1.20)
Chloride: 106 mEq/L (ref 96–112)
GFR: 59.12 mL/min — ABNORMAL LOW (ref >60.00)
Glucose, Bld: 128 mg/dL — ABNORMAL HIGH (ref 70–99)
Potassium: 3.9 mEq/L (ref 3.5–5.1)
Sodium: 139 mEq/L (ref 135–145)

## 2015-08-14 LAB — TSH: TSH: 3.05 u[IU]/mL (ref 0.35–4.50)

## 2015-08-21 ENCOUNTER — Encounter: Payer: Self-pay | Admitting: Internal Medicine

## 2015-08-21 ENCOUNTER — Ambulatory Visit (INDEPENDENT_AMBULATORY_CARE_PROVIDER_SITE_OTHER): Payer: BLUE CROSS/BLUE SHIELD | Admitting: Internal Medicine

## 2015-08-21 VITALS — BP 124/80 | Temp 98.1°F | Ht 68.0 in | Wt 200.0 lb

## 2015-08-21 DIAGNOSIS — Z0000 Encounter for general adult medical examination without abnormal findings: Secondary | ICD-10-CM

## 2015-08-21 DIAGNOSIS — R519 Headache, unspecified: Secondary | ICD-10-CM

## 2015-08-21 DIAGNOSIS — Z79899 Other long term (current) drug therapy: Secondary | ICD-10-CM | POA: Diagnosis not present

## 2015-08-21 DIAGNOSIS — R739 Hyperglycemia, unspecified: Secondary | ICD-10-CM

## 2015-08-21 DIAGNOSIS — F4321 Adjustment disorder with depressed mood: Secondary | ICD-10-CM

## 2015-08-21 DIAGNOSIS — Z23 Encounter for immunization: Secondary | ICD-10-CM | POA: Diagnosis not present

## 2015-08-21 DIAGNOSIS — R51 Headache: Secondary | ICD-10-CM | POA: Diagnosis not present

## 2015-08-21 NOTE — Progress Notes (Signed)
Pre visit review using our clinic review tool, if applicable. No additional management support is needed unless otherwise documented below in the visit note.  Chief Complaint  Patient presents with  . Annual Exam    HPI: Patient  Sandra Hudson  39 y.o. comes in today for [facility] visit   HAs 3-4 pills per month  Some menstrual not too back related to sleep loss with kids at  home Mood: so far ok on same meds  Rare use of ambien Health Maintenance  Topic Date Due  . PNEUMOCOCCAL POLYSACCHARIDE VACCINE (1) 09/25/1979  . FOOT EXAM  09/25/1987  . OPHTHALMOLOGY EXAM  09/25/1987  . URINE MICROALBUMIN  09/16/2011  . HEMOGLOBIN A1C  03/10/2015  . INFLUENZA VACCINE  05/17/2016  . PAP SMEAR  07/17/2016  . TETANUS/TDAP  02/02/2024  . HIV Screening  Completed   Health Maintenance Review LIFESTYLE:  Exercise:  walks Tobacco/ETS:n Alcohol: per day rare Sugar beverages:limited now Sleep:interrupted  kids Drug use: no  ROS:  GEN/ HEENT: No fever, significant weight changes sweats  vision problems hearing changes, CV/ PULM; No chest pain shortness of breath cough, syncope,edema  change in exercise tolerance. GI /GU: No adominal pain, vomiting, change in bowel habits. No blood in the stool. No significant GU symptoms. SKIN/HEME: ,no acute skin rashes suspicious lesions or bleeding. No lymphadenopathy, nodules, masses.  NEURO/ PSYCH:  No neurologic signs such as weakness numbness. No depression anxiety. IMM/ Allergy: No unusual infections.  Allergy .   REST of 12 system review negative except as per HPI   Past Medical History  Diagnosis Date  . Allergy   . Headache(784.0)   . Female infertility   . Allergic rhinitis due to dust   . Pregnancy induced hypertension   . Pre-eclampsia 01/29/2014    Past Surgical History  Procedure Laterality Date  . Nasal septum surgery      deviated septum and large turbinate  . Cesarean section N/A 01/31/2014    Procedure: CESAREAN  SECTION;  Surgeon: Delice Lesch, MD;  Location: [facility];  Service: Obstetrics;  Laterality: N/A;    Family History  Problem Relation Age of Onset  . Depression Father   . Depression Mother   . Breast cancer      maternal great aunt  . Breast cancer Paternal Grandmother   . ALS Father     multiple system atropy    Social History   Social History  . Marital Status: Married    Spouse Name: N/A  . Number of Children: 1  . Years of Education: N/A   Occupational History  .     Social History Main Topics  . Smoking status: Never Smoker   . Smokeless tobacco: Never Used  . Alcohol Use: No     Comment: occasionally  . Drug Use: No  . Sexual Activity: Not Asked   Other Topics Concern  . 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    Outpatient Prescriptions Prior to Visit  Medication Sig Dispense Refill  . buPROPion (WELLBUTRIN XL) 300 MG 24 hr tablet Take 1 tablet (300 mg total) by mouth every morning. 90 tablet 3  . DULoxetine (CYMBALTA) 60 MG capsule Take 1 capsule by mouth  daily 90 capsule 1  . SUMAtriptan (IMITREX) 100  MG tablet take 1 tablet by mouth REPEAT IN 2 HOURS IF NEEDED 27 tablet 1  . zolpidem (AMBIEN) 5 MG tablet take 1 tablet by mouth at bedtime if needed for sleep 30 tablet 0   No facility-administered medications prior to visit.     EXAM:  BP 124/80 mmHg  Temp(Src) 98.1 F (36.7 C) (Oral)  Ht '5\' 8"'  (1.727 m)  Wt 200 lb (90.719 kg)  BMI 30.42 kg/m2  LMP 08/18/2015  Breastfeeding? No  Body mass index is 30.42 kg/(m^2).  Physical Exam: Vital signs reviewed HEN:IDPO is a well-developed well-nourished alert cooperative    who appearsr stated age in no acute distress.  HEENT: normocephalic atraumatic , Eyes: PERRL EOM's full, conjunctiva clear, Nares: paten,t no deformity discharge or tenderness., Ears: no deformity EAC's clear TMs with normal  landmarks. Mouth: clear OP, no lesions, edema.  Moist mucous membranes. Dentition in adequate repair. NECK: supple without masses, thyromegaly or bruits. CHEST/PULM:  Clear to auscultation and percussion breath sounds equal no wheeze , rales or rhonchi. No chest wall deformities or tenderness .Breast: normal by inspection . No dimpling, discharge, masses, tenderness or discharge . CV: PMI is nondisplaced, S1 S2 no gallops, murmurs, rubs. Peripheral pulses are full without delay.No JVD .   ABDOMEN: Bowel sounds normal nontender  No guard or rebound, no hepato splenomegal no CVA tenderness.  . Extremtities:  No clubbing cyanosis or edema, no acute joint swelling or redness no focal atrophy NEURO:  Oriented x3, cranial nerves 3-12 appear to be intact, no obvious focal weakness,gait within normal limits no abnormal reflexes or asymmetrical SKIN: No acute rashes normal turgor, color, no bruising or petechiae. PSYCH: Oriented, good eye contact, no obvious depression anxiety, cognition and judgment appear normal. LN: no cervical axillary inguinal adenopathy  Lab Results  Component Value Date   WBC 5.4 08/14/2015   HGB 13.7 08/14/2015   HCT 41.0 08/14/2015   PLT 298.0 08/14/2015   GLUCOSE 128* 08/14/2015   CHOL 193 08/14/2015   TRIG 134.0 08/14/2015   HDL 44.80 08/14/2015   LDLDIRECT 158.0 11/06/2012   LDLCALC 122* 08/14/2015   ALT 18 08/14/2015   AST 16 08/14/2015   NA 139 08/14/2015   K 3.9 08/14/2015   CL 106 08/14/2015   CREATININE 1.10 08/14/2015   BUN 18 08/14/2015   CO2 25 08/14/2015   TSH 3.05 08/14/2015   HGBA1C 6.1 09/09/2014   MICROALBUR 1.1 09/15/2010   BP Readings from Last 3 Encounters:  08/21/15 124/80  02/03/15 122/80  09/09/14 112/70   Wt Readings from Last 3 Encounters:  08/21/15 200 lb (90.719 kg)  02/03/15 207 lb (93.895 kg)  09/09/14 205 lb (92.987 kg)     ASSESSMENT AND PLAN:  Discussed the following assessment and plan:  Visit for preventive health  examination  Hyperglycemia - 128 was not fasting   2 am snack  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  Patient Care Team: Burnis Medin, MD as PCP - General Melida Quitter, MD Donnel Saxon, CNM as Midwife Lajuan Lines  Hilarie Fredrickson, MD (Gastroenterology) Devra Dopp, MD as Referring Physician (Dermatology) Patient Instructions  Continue lifestyle intervention healthy eating and exercise . Work on sleep as much as possible .  Avoid sugars and  Simple carbs  Lab in 4 months and then ov . Can contact us for refills .   Health Maintenance, Female Adopting a healthy lifestyle and getting preventive care can go a long way to promote health and wellness. Talk with your health care provider about what schedule of regular examinations is right for you. This is a good chance for you to check in with your provider about disease prevention and staying healthy. In between checkups, there are plenty of things you can do on your own. Experts have done a lot of research about which lifestyle changes and preventive measures are most likely to keep you healthy. Ask your health care provider for more information. WEIGHT AND DIET  Eat a healthy diet  Be sure to include plenty of vegetables, fruits, low-fat dairy products, and lean protein.  Do not eat a lot of foods high in solid fats, added sugars, or salt.  Get regular exercise. This is one of the most important things you can do for your health.  Most adults should exercise for at least 150 minutes each week. The exercise should increase  your heart rate and make you sweat (moderate-intensity exercise).  Most adults should also do strengthening exercises at least twice a week. This is in addition to the moderate-intensity exercise.  Maintain a healthy weight  Body mass index (BMI) is a measurement that can be used to identify possible weight problems. It estimates body fat based on height and weight. Your health care provider can help determine your BMI and help you achieve or maintain a healthy weight.  For females 32 years of age and older:   A BMI below 18.5 is considered underweight.  A BMI of 18.5 to 24.9 is normal.  A BMI of 25 to 29.9 is considered overweight.  A BMI of 30 and above is considered obese.  Watch levels of cholesterol and blood lipids  You should start having your blood tested for lipids and cholesterol at 38 years of age, then have this test every 5 years.  You may need to have your cholesterol levels checked more often if:  Your lipid or cholesterol levels are high.  You are older than 38 years of age.  You are at high risk for heart disease.  CANCER SCREENING   Lung Cancer  Lung cancer screening is recommended for adults 20-72 years old who are at high risk for lung cancer because of a history of smoking.  A yearly low-dose CT scan of the lungs is recommended for people who:  Currently smoke.  Have quit within the past 15 years.  Have at least a 30-pack-year history of smoking. A pack year is smoking an average of one pack of cigarettes a day for 1 year.  Yearly screening should continue until it has been 15 years since you quit.  Yearly screening should stop if you develop a health problem that would prevent you from having lung cancer treatment.  Breast Cancer  Practice breast self-awareness. This means understanding how your breasts normally appear and feel.  It also means doing regular breast self-exams. Let your health care provider know about any changes, no matter  how small.  If you are in your 20s or 30s, you should have a clinical breast exam (CBE) by a health  care provider every 1-3 years as part of a regular health exam.  If you are 55 or older, have a CBE every year. Also consider having a breast X-ray (mammogram) every year.  If you have a family history of breast cancer, talk to your health care provider about genetic screening.  If you are at high risk for breast cancer, talk to your health care provider about having an MRI and a mammogram every year.  Breast cancer gene (BRCA) assessment is recommended for women who have family members with BRCA-related cancers. BRCA-related cancers include:  Breast.  Ovarian.  Tubal.  Peritoneal cancers.  Results of the assessment will determine the need for genetic counseling and BRCA1 and BRCA2 testing. Cervical Cancer Your health care provider may recommend that you be screened regularly for cancer of the pelvic organs (ovaries, uterus, and vagina). This screening involves a pelvic examination, including checking for microscopic changes to the surface of your cervix (Pap test). You may be encouraged to have this screening done every 3 years, beginning at age 22.  For women ages 49-65, health care providers may recommend pelvic exams and Pap testing every 3 years, or they may recommend the Pap and pelvic exam, combined with testing for human papilloma virus (HPV), every 5 years. Some types of HPV increase your risk of cervical cancer. Testing for HPV may also be done on women of any age with unclear Pap test results.  Other health care providers may not recommend any screening for nonpregnant women who are considered low risk for pelvic cancer and who do not have symptoms. Ask your health care provider if a screening pelvic exam is right for you.  If you have had past treatment for cervical cancer or a condition that could lead to cancer, you need Pap tests and screening for cancer for at least 20 years  after your treatment. If Pap tests have been discontinued, your risk factors (such as having a new sexual partner) need to be reassessed to determine if screening should resume. Some women have medical problems that increase the chance of getting cervical cancer. In these cases, your health care provider may recommend more frequent screening and Pap tests. Colorectal Cancer  This type of cancer can be detected and often prevented.  Routine colorectal cancer screening usually begins at 38 years of age and continues through 37 years of age.  Your health care provider may recommend screening at an earlier age if you have risk factors for colon cancer.  Your health care provider may also recommend using home test kits to check for hidden blood in the stool.  A small camera at the end of a tube can be used to examine your colon directly (sigmoidoscopy or colonoscopy). This is done to check for the earliest forms of colorectal cancer.  Routine screening usually begins at age 59.  Direct examination of the colon should be repeated every 5-10 years through 38 years of age. However, you may need to be screened more often if early forms of precancerous polyps or small growths are found. Skin Cancer  Check your skin from head to toe regularly.  Tell your health care provider about any new moles or changes in moles, especially if there is a change in a mole's shape or color.  Also tell your health care provider if you have a mole that is larger than the size of a pencil eraser.  Always use sunscreen. Apply sunscreen liberally and repeatedly throughout the day.  Protect yourself  by wearing long sleeves, pants, a wide-brimmed hat, and sunglasses whenever you are outside. HEART DISEASE, DIABETES, AND HIGH BLOOD PRESSURE   High blood pressure causes heart disease and increases the risk of stroke. High blood pressure is more likely to develop in:  People who have blood pressure in the high end of the  normal range (130-139/85-89 mm Hg).  People who are overweight or obese.  People who are African American.  If you are 60-16 years of age, have your blood pressure checked every 3-5 years. If you are 39 years of age or older, have your blood pressure checked every year. You should have your blood pressure measured twice--once when you are at a hospital or clinic, and once when you are not at a hospital or clinic. Record the average of the two measurements. To check your blood pressure when you are not at a hospital or clinic, you can use:  An automated blood pressure machine at a pharmacy.  A home blood pressure monitor.  If you are between 43 years and 81 years old, ask your health care provider if you should take aspirin to prevent strokes.  Have regular diabetes screenings. This involves taking a blood sample to check your fasting blood sugar level.  If you are at a normal weight and have a low risk for diabetes, have this test once every three years after 38 years of age.  If you are overweight and have a high risk for diabetes, consider being tested at a younger age or more often. PREVENTING INFECTION  Hepatitis B  If you have a higher risk for hepatitis B, you should be screened for this virus. You are considered at high risk for hepatitis B if:  You were born in a country where hepatitis B is common. Ask your health care provider which countries are considered high risk.  Your parents were born in a high-risk country, and you have not been immunized against hepatitis B (hepatitis B vaccine).  You have HIV or AIDS.  You use needles to inject street drugs.  You live with someone who has hepatitis B.  You have had sex with someone who has hepatitis B.  You get hemodialysis treatment.  You take certain medicines for conditions, including cancer, organ transplantation, and autoimmune conditions. Hepatitis C  Blood testing is recommended for:  Everyone born from [birth year]  through 1964.  Anyone with known risk factors for hepatitis C. Sexually transmitted infections (STIs)  You should be screened for sexually transmitted infections (STIs) including gonorrhea and chlamydia if:  You are sexually active and are younger than 38 years of age.  You are older than 38 years of age and your health care provider tells you that you are at risk for this type of infection.  Your sexual activity has changed since you were last screened and you are at an increased risk for chlamydia or gonorrhea. Ask your health care provider if you are at risk.  If you do not have HIV, but are at risk, it may be recommended that you take a prescription medicine daily to prevent HIV infection. This is called pre-exposure prophylaxis (PrEP). You are considered at risk if:  You are sexually active and do not regularly use condoms or know the HIV status of your partner(s).  You take drugs by injection.  You are sexually active with a partner who has HIV. Talk with your health care provider about whether you are at high risk of being infected with HIV.  If you choose to begin PrEP, you should first be tested for HIV. You should then be tested every 3 months for as long as you are taking PrEP.  PREGNANCY   If you are premenopausal and you may become pregnant, ask your health care provider about preconception counseling.  If you may become pregnant, take 400 to 800 micrograms (mcg) of folic acid every day.  If you want to prevent pregnancy, talk to your health care provider about birth control (contraception). OSTEOPOROSIS AND MENOPAUSE   Osteoporosis is a disease in which the bones lose minerals and strength with aging. This can result in serious bone fractures. Your risk for osteoporosis can be identified using a bone density scan.  If you are 77 years of age or older, or if you are at risk for osteoporosis and fractures, ask your health care provider if you should be screened.  Ask your  health care provider whether you should take a calcium or vitamin D supplement to lower your risk for osteoporosis.  Menopause may have certain physical symptoms and risks.  Hormone replacement therapy may reduce some of these symptoms and risks. Talk to your health care provider about whether hormone replacement therapy is right for you.  HOME CARE INSTRUCTIONS   Schedule regular health, dental, and eye exams.  Stay current with your immunizations.   Do not use any tobacco products including cigarettes, chewing tobacco, or electronic cigarettes.  If you are pregnant, do not drink alcohol.  If you are breastfeeding, limit how much and how often you drink alcohol.  Limit alcohol intake to no more than 1 drink per day for nonpregnant women. One drink equals 12 ounces of beer, 5 ounces of wine, or 1 ounces of hard liquor.  Do not use street drugs.  Do not share needles.  Ask your health care provider for help if you need support or information about quitting drugs.  Tell your health care provider if you often feel depressed.  Tell your health care provider if you have ever been abused or do not feel safe at home.   This information is not intended to replace advice given to you by your health care provider. Make sure you discuss any questions you have with your health care provider.   Document Released: 04/18/2011 Document Revised: 10/24/2014 Document Reviewed: 09/04/2013 Elsevier Interactive Patient Education 2016 [place] K. Panosh M.D.

## 2015-08-21 NOTE — Patient Instructions (Addendum)
Continue lifestyle intervention healthy eating and exercise . Work on sleep as much as possible .  Avoid sugars and  Simple carbs  Lab in 4 months and then ov . Can contact us for refills .   Health Maintenance, Female Adopting a healthy lifestyle and getting preventive care can go a long way to promote health and wellness. Talk with your health care provider about what schedule of regular examinations is right for you. This is a good chance for you to check in with your provider about disease prevention and staying healthy. In between checkups, there are plenty of things you can do on your own. Experts have done a lot of research about which lifestyle changes and preventive measures are most likely to keep you healthy. Ask your health care provider for more information. WEIGHT AND DIET  Eat a healthy diet  Be sure to include plenty of vegetables, fruits, low-fat dairy products, and lean protein.  Do not eat a lot of foods high in solid fats, added sugars, or salt.  Get regular exercise. This is one of the most important things you can do for your health.  Most adults should exercise for at least 150 minutes each week. The exercise should increase your heart rate and make you sweat (moderate-intensity exercise).  Most adults should also do strengthening exercises at least twice a week. This is in addition to the moderate-intensity exercise.  Maintain a healthy weight  Body mass index (BMI) is a measurement that can be used to identify possible weight problems. It estimates body fat based on height and weight. Your health care provider can help determine your BMI and help you achieve or maintain a healthy weight.  For females 1 years of age and older:   A BMI below 18.5 is considered underweight.  A BMI of 18.5 to 24.9 is normal.  A BMI of 25 to 29.9 is considered overweight.  A BMI of 30 and above is considered obese.  Watch levels of cholesterol and blood lipids  You should  start having your blood tested for lipids and cholesterol at 38 years of age, then have this test every 5 years.  You may need to have your cholesterol levels checked more often if:  Your lipid or cholesterol levels are high.  You are older than 38 years of age.  You are at high risk for heart disease.  CANCER SCREENING   Lung Cancer  Lung cancer screening is recommended for adults 68-15 years old who are at high risk for lung cancer because of a history of smoking.  A yearly low-dose CT scan of the lungs is recommended for people who:  Currently smoke.  Have quit within the past 15 years.  Have at least a 30-pack-year history of smoking. A pack year is smoking an average of one pack of cigarettes a day for 1 year.  Yearly screening should continue until it has been 15 years since you quit.  Yearly screening should stop if you develop a health problem that would prevent you from having lung cancer treatment.  Breast Cancer  Practice breast self-awareness. This means understanding how your breasts normally appear and feel.  It also means doing regular breast self-exams. Let your health care provider know about any changes, no matter how small.  If you are in your 20s or 30s, you should have a clinical breast exam (CBE) by a health care provider every 1-3 years as part of a regular health exam.  If you  are 35 or older, have a CBE every year. Also consider having a breast X-ray (mammogram) every year.  If you have a family history of breast cancer, talk to your health care provider about genetic screening.  If you are at high risk for breast cancer, talk to your health care provider about having an MRI and a mammogram every year.  Breast cancer gene (BRCA) assessment is recommended for women who have family members with BRCA-related cancers. BRCA-related cancers include:  Breast.  Ovarian.  Tubal.  Peritoneal cancers.  Results of the assessment will determine the  need for genetic counseling and BRCA1 and BRCA2 testing. Cervical Cancer Your health care provider may recommend that you be screened regularly for cancer of the pelvic organs (ovaries, uterus, and vagina). This screening involves a pelvic examination, including checking for microscopic changes to the surface of your cervix (Pap test). You may be encouraged to have this screening done every 3 years, beginning at age 65.  For women ages 65-65, health care providers may recommend pelvic exams and Pap testing every 3 years, or they may recommend the Pap and pelvic exam, combined with testing for human papilloma virus (HPV), every 5 years. Some types of HPV increase your risk of cervical cancer. Testing for HPV may also be done on women of any age with unclear Pap test results.  Other health care providers may not recommend any screening for nonpregnant women who are considered low risk for pelvic cancer and who do not have symptoms. Ask your health care provider if a screening pelvic exam is right for you.  If you have had past treatment for cervical cancer or a condition that could lead to cancer, you need Pap tests and screening for cancer for at least 20 years after your treatment. If Pap tests have been discontinued, your risk factors (such as having a new sexual partner) need to be reassessed to determine if screening should resume. Some women have medical problems that increase the chance of getting cervical cancer. In these cases, your health care provider may recommend more frequent screening and Pap tests. Colorectal Cancer  This type of cancer can be detected and often prevented.  Routine colorectal cancer screening usually begins at 38 years of age and continues through 38 years of age.  Your health care provider may recommend screening at an earlier age if you have risk factors for colon cancer.  Your health care provider may also recommend using home test kits to check for hidden blood in  the stool.  A small camera at the end of a tube can be used to examine your colon directly (sigmoidoscopy or colonoscopy). This is done to check for the earliest forms of colorectal cancer.  Routine screening usually begins at age 33.  Direct examination of the colon should be repeated every 5-10 years through 38 years of age. However, you may need to be screened more often if early forms of precancerous polyps or small growths are found. Skin Cancer  Check your skin from head to toe regularly.  Tell your health care provider about any new moles or changes in moles, especially if there is a change in a mole's shape or color.  Also tell your health care provider if you have a mole that is larger than the size of a pencil eraser.  Always use sunscreen. Apply sunscreen liberally and repeatedly throughout the day.  Protect yourself by wearing long sleeves, pants, a wide-brimmed hat, and sunglasses whenever you are outside.  HEART DISEASE, DIABETES, AND HIGH BLOOD PRESSURE   High blood pressure causes heart disease and increases the risk of stroke. High blood pressure is more likely to develop in:  People who have blood pressure in the high end of the normal range (130-139/85-89 mm Hg).  People who are overweight or obese.  People who are African American.  If you are 18-39 years of age, have your blood pressure checked every 3-5 years. If you are 40 years of age or older, have your blood pressure checked every year. You should have your blood pressure measured twice--once when you are at a hospital or clinic, and once when you are not at a hospital or clinic. Record the average of the two measurements. To check your blood pressure when you are not at a hospital or clinic, you can use:  An automated blood pressure machine at a pharmacy.  A home blood pressure monitor.  If you are between 55 years and 79 years old, ask your health care provider if you should take aspirin to prevent  strokes.  Have regular diabetes screenings. This involves taking a blood sample to check your fasting blood sugar level.  If you are at a normal weight and have a low risk for diabetes, have this test once every three years after 38 years of age.  If you are overweight and have a high risk for diabetes, consider being tested at a younger age or more often. PREVENTING INFECTION  Hepatitis B  If you have a higher risk for hepatitis B, you should be screened for this virus. You are considered at high risk for hepatitis B if:  You were born in a country where hepatitis B is common. Ask your health care provider which countries are considered high risk.  Your parents were born in a high-risk country, and you have not been immunized against hepatitis B (hepatitis B vaccine).  You have HIV or AIDS.  You use needles to inject street drugs.  You live with someone who has hepatitis B.  You have had sex with someone who has hepatitis B.  You get hemodialysis treatment.  You take certain medicines for conditions, including cancer, organ transplantation, and autoimmune conditions. Hepatitis C  Blood testing is recommended for:  Everyone born from 1945 through 1965.  Anyone with known risk factors for hepatitis C. Sexually transmitted infections (STIs)  You should be screened for sexually transmitted infections (STIs) including gonorrhea and chlamydia if:  You are sexually active and are younger than 38 years of age.  You are older than 38 years of age and your health care provider tells you that you are at risk for this type of infection.  Your sexual activity has changed since you were last screened and you are at an increased risk for chlamydia or gonorrhea. Ask your health care provider if you are at risk.  If you do not have HIV, but are at risk, it may be recommended that you take a prescription medicine daily to prevent HIV infection. This is called pre-exposure prophylaxis  (PrEP). You are considered at risk if:  You are sexually active and do not regularly use condoms or know the HIV status of your partner(s).  You take drugs by injection.  You are sexually active with a partner who has HIV. Talk with your health care provider about whether you are at high risk of being infected with HIV. If you choose to begin PrEP, you should first be tested for HIV. You   should then be tested every 3 months for as long as you are taking PrEP.  PREGNANCY   If you are premenopausal and you may become pregnant, ask your health care provider about preconception counseling.  If you may become pregnant, take 400 to 800 micrograms (mcg) of folic acid every day.  If you want to prevent pregnancy, talk to your health care provider about birth control (contraception). OSTEOPOROSIS AND MENOPAUSE   Osteoporosis is a disease in which the bones lose minerals and strength with aging. This can result in serious bone fractures. Your risk for osteoporosis can be identified using a bone density scan.  If you are 65 years of age or older, or if you are at risk for osteoporosis and fractures, ask your health care provider if you should be screened.  Ask your health care provider whether you should take a calcium or vitamin D supplement to lower your risk for osteoporosis.  Menopause may have certain physical symptoms and risks.  Hormone replacement therapy may reduce some of these symptoms and risks. Talk to your health care provider about whether hormone replacement therapy is right for you.  HOME CARE INSTRUCTIONS   Schedule regular health, dental, and eye exams.  Stay current with your immunizations.   Do not use any tobacco products including cigarettes, chewing tobacco, or electronic cigarettes.  If you are pregnant, do not drink alcohol.  If you are breastfeeding, limit how much and how often you drink alcohol.  Limit alcohol intake to no more than 1 drink per day for  nonpregnant women. One drink equals 12 ounces of beer, 5 ounces of wine, or 1 ounces of hard liquor.  Do not use street drugs.  Do not share needles.  Ask your health care provider for help if you need support or information about quitting drugs.  Tell your health care provider if you often feel depressed.  Tell your health care provider if you have ever been abused or do not feel safe at home.   This information is not intended to replace advice given to you by your health care provider. Make sure you discuss any questions you have with your health care provider.   Document Released: 04/18/2011 Document Revised: 10/24/2014 Document Reviewed: 09/04/2013 Elsevier Interactive Patient Education 2016 [employer]  

## 2015-10-01 ENCOUNTER — Other Ambulatory Visit: Payer: Self-pay | Admitting: Internal Medicine

## 2015-10-01 NOTE — Telephone Encounter (Signed)
Sent to the pharmacy by e-scribe. 

## 2015-10-16 ENCOUNTER — Other Ambulatory Visit: Payer: Self-pay | Admitting: Internal Medicine

## 2015-10-16 NOTE — Telephone Encounter (Signed)
Sent to the pharmacy by e-scribe. 

## 2015-12-18 ENCOUNTER — Other Ambulatory Visit (INDEPENDENT_AMBULATORY_CARE_PROVIDER_SITE_OTHER): Payer: BLUE CROSS/BLUE SHIELD

## 2015-12-18 DIAGNOSIS — I10 Essential (primary) hypertension: Secondary | ICD-10-CM | POA: Diagnosis not present

## 2015-12-18 DIAGNOSIS — E119 Type 2 diabetes mellitus without complications: Secondary | ICD-10-CM

## 2015-12-18 LAB — BASIC METABOLIC PANEL
BUN: 14 mg/dL (ref 6–23)
CALCIUM: 9 mg/dL (ref 8.4–10.5)
CO2: 25 mEq/L (ref 19–32)
Chloride: 107 mEq/L (ref 96–112)
Creatinine, Ser: 1.07 mg/dL (ref 0.40–1.20)
GFR: 60.92 mL/min (ref >60.00)
Glucose, Bld: 117 mg/dL — ABNORMAL HIGH (ref 70–99)
POTASSIUM: 3.9 meq/L (ref 3.5–5.1)
SODIUM: 140 meq/L (ref 135–145)

## 2015-12-18 LAB — HEMOGLOBIN A1C: HEMOGLOBIN A1C: 5.6 % (ref 4.6–6.5)

## 2015-12-24 NOTE — Progress Notes (Signed)
Pre visit review using our clinic review tool, if applicable. No additional management support is needed unless otherwise documented below in the visit note.  Chief Complaint  Patient presents with  . Follow-up    HPI: Sandra Hudson 40 y.o.   Fu hyperglycemia per diabetes  Has been on weight watcher and has lost weight .  recenetly not feeling as well    But  Looking to move to [place] where husbands job is.   Unsettling .  No other physica l sx  ROS: See pertinent positives and negatives per HPI.  Past Medical History  Diagnosis Date  . Allergy   . Headache(784.0)   . Female infertility   . Allergic rhinitis due to dust   . Pregnancy induced hypertension   . Pre-eclampsia 01/29/2014    Family History  Problem Relation Age of Onset  . Depression Father   . Depression Mother   . Breast cancer      maternal great aunt  . Breast cancer Paternal Grandmother   . ALS Father     multiple system atropy    Social History   Social History  . Marital Status: Married    Spouse Name: N/A  . Number of Children: 1  . Years of Education: N/A   Occupational History  .     Social History Main Topics  . Smoking status: Never Smoker   . Smokeless tobacco: Never Used  . Alcohol Use: No     Comment: occasionally  . Drug Use: No  . Sexual Activity: Not Asked   Other Topics Concern  . 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    Outpatient Prescriptions Prior to Visit  Medication Sig Dispense Refill  . buPROPion (WELLBUTRIN XL) 300 MG 24 hr tablet take 1 tablet by mouth every morning 90 tablet 2  . DULoxetine (CYMBALTA) 60 MG capsule take 1 capsule by mouth once daily 90 capsule 0  . FALMINA 0.1-20 MG-MCG tablet Take 1 tablet by mouth daily.  10  . SUMAtriptan (IMITREX) 100 MG tablet take 1 tablet by mouth REPEAT IN 2 HOURS IF NEEDED 27 tablet 1  . zolpidem (AMBIEN) 5 MG tablet  take 1 tablet by mouth at bedtime if needed for sleep 30 tablet 0  . DULoxetine (CYMBALTA) 60 MG capsule Take 1 capsule by mouth  daily 90 capsule 1   No facility-administered medications prior to visit.     EXAM:  BP 124/74 mmHg  Temp(Src) 98.3 F (36.8 C) (Oral)  Wt 182 lb 12.8 oz (82.918 kg)  Body mass index is 27.8 kg/(m^2).  GENERAL: vitals reviewed and listed above, alert, oriented, appears well hydrated and in no acute distress HEENT: atraumatic, conjunctiva  clear, no obvious abnormalities on inspection of external nose and ears LUNGS: clear to auscultation bilaterally, no wheezes, rales or rhonchi, good air movement CV: HRRR, no clubbing cyanosis or  peripheral edema nl cap refill  MS: moves all extremities without noticeable focal  abnormality PSYCH: pleasant and cooperative, nl speech and cognition Lab Results  Component Value Date   WBC 5.4 08/14/2015   HGB 13.7 08/14/2015   HCT 41.0 08/14/2015   PLT 298.0 08/14/2015   GLUCOSE 117* 12/18/2015   CHOL 193 08/14/2015   TRIG 134.0 08/14/2015   HDL 44.80 08/14/2015  LDLDIRECT 158.0 11/06/2012   LDLCALC 122* 08/14/2015   ALT 18 08/14/2015   AST 16 08/14/2015   NA 140 12/18/2015   K 3.9 12/18/2015   CL 107 12/18/2015   CREATININE 1.07 12/18/2015   BUN 14 12/18/2015   CO2 25 12/18/2015   TSH 3.05 08/14/2015   HGBA1C 5.6 12/18/2015   MICROALBUR 1.1 09/15/2010   Wt Readings from Last 3 Encounters:  12/25/15 182 lb 12.8 oz (82.918 kg)  08/21/15 200 lb (90.719 kg)  02/03/15 207 lb (93.895 kg)   BP Readings from Last 3 Encounters:  12/25/15 124/74  08/21/15 124/80  02/03/15 122/80    Lab Results  Component Value Date   WBC 5.4 08/14/2015   HGB 13.7 08/14/2015   HCT 41.0 08/14/2015   PLT 298.0 08/14/2015   GLUCOSE 117* 12/18/2015   CHOL 193 08/14/2015   TRIG 134.0 08/14/2015   HDL 44.80 08/14/2015   LDLDIRECT 158.0 11/06/2012   LDLCALC 122* 08/14/2015   ALT 18 08/14/2015   AST 16 08/14/2015   NA  140 12/18/2015   K 3.9 12/18/2015   CL 107 12/18/2015   CREATININE 1.07 12/18/2015   BUN 14 12/18/2015   CO2 25 12/18/2015   TSH 3.05 08/14/2015   HGBA1C 5.6 12/18/2015   MICROALBUR 1.1 09/15/2010     ASSESSMENT AND PLAN:  Discussed the following assessment and plan:  Hyperglycemia - muc imporved cont weight loss or lsi weight watchers disc barriers and physiology adjstement stress  around moving  Continue lifestyle intervention healthy eating and exercise . And wellness in the fall with a1c  If  Needed return in interim  Uses ambien rarely  -Patient advised to return or notify health care team  if symptoms worsen ,persist or new concerns arise.  Patient Instructions  Continue lifestyle intervention healthy eating and exercise .  Wellness visit   In November.  17   Lab plus hg a1c .     Neta [name]. Bubber Rothert M.D.

## 2015-12-25 ENCOUNTER — Ambulatory Visit (INDEPENDENT_AMBULATORY_CARE_PROVIDER_SITE_OTHER): Payer: BLUE CROSS/BLUE SHIELD | Admitting: Internal Medicine

## 2015-12-25 ENCOUNTER — Encounter: Payer: Self-pay | Admitting: Internal Medicine

## 2015-12-25 VITALS — BP 124/74 | Temp 98.3°F | Wt 182.8 lb

## 2015-12-25 DIAGNOSIS — R739 Hyperglycemia, unspecified: Secondary | ICD-10-CM | POA: Diagnosis not present

## 2015-12-25 DIAGNOSIS — Z79899 Other long term (current) drug therapy: Secondary | ICD-10-CM | POA: Diagnosis not present

## 2015-12-25 NOTE — Patient Instructions (Signed)
Continue lifestyle intervention healthy eating and exercise .  Wellness visit   In November.  17   Lab plus hg a1c .

## 2016-01-15 ENCOUNTER — Other Ambulatory Visit: Payer: Self-pay | Admitting: Internal Medicine

## 2016-01-15 MED ORDER — DULOXETINE HCL 60 MG PO CPEP: 60.0000 mg | ORAL_CAPSULE | Freq: Every day | ORAL | Status: DC

## 2016-02-29 ENCOUNTER — Other Ambulatory Visit: Payer: Self-pay | Admitting: Family Medicine

## 2016-02-29 MED ORDER — SUMATRIPTAN SUCCINATE 100 MG PO TABS: ORAL_TABLET | ORAL | Status: DC

## 2016-02-29 NOTE — Telephone Encounter (Signed)
Sent to the pharmacy by e-scribe.  Pt has upcoming cpx in 08/2016

## 2016-04-07 DIAGNOSIS — D485 Neoplasm of uncertain behavior of skin: Secondary | ICD-10-CM | POA: Diagnosis not present

## 2016-04-07 DIAGNOSIS — L814 Other melanin hyperpigmentation: Secondary | ICD-10-CM | POA: Diagnosis not present

## 2016-04-07 DIAGNOSIS — D225 Melanocytic nevi of trunk: Secondary | ICD-10-CM | POA: Diagnosis not present

## 2016-04-07 DIAGNOSIS — L400 Psoriasis vulgaris: Secondary | ICD-10-CM | POA: Diagnosis not present

## 2016-04-07 DIAGNOSIS — Z86018 Personal history of other benign neoplasm: Secondary | ICD-10-CM | POA: Diagnosis not present

## 2016-04-07 DIAGNOSIS — D280 Benign neoplasm of vulva: Secondary | ICD-10-CM | POA: Diagnosis not present

## 2016-04-11 DIAGNOSIS — Z01419 Encounter for gynecological examination (general) (routine) without abnormal findings: Secondary | ICD-10-CM | POA: Diagnosis not present

## 2016-05-18 ENCOUNTER — Other Ambulatory Visit: Payer: Self-pay | Admitting: Internal Medicine

## 2016-05-19 NOTE — Telephone Encounter (Signed)
Ok to refill x 1  I  Updated sig.

## 2016-05-20 NOTE — Telephone Encounter (Signed)
Called to the pharmacy and left on machine. 

## 2016-05-30 ENCOUNTER — Other Ambulatory Visit: Payer: Self-pay | Admitting: Internal Medicine

## 2016-05-31 NOTE — Telephone Encounter (Signed)
Pt would like new rx ambien 5 mg #30 w/refills

## 2016-06-01 NOTE — Telephone Encounter (Signed)
Called in on 05/20/16.  Message sent back to the pharmacy to check file.

## 2016-06-21 ENCOUNTER — Other Ambulatory Visit: Payer: Self-pay | Admitting: Internal Medicine

## 2016-07-09 ENCOUNTER — Other Ambulatory Visit: Payer: Self-pay | Admitting: Internal Medicine

## 2016-07-11 ENCOUNTER — Other Ambulatory Visit: Payer: Self-pay | Admitting: Family Medicine

## 2016-07-11 DIAGNOSIS — R739 Hyperglycemia, unspecified: Secondary | ICD-10-CM

## 2016-07-11 DIAGNOSIS — Z0000 Encounter for general adult medical examination without abnormal findings: Secondary | ICD-10-CM

## 2016-07-11 NOTE — Telephone Encounter (Signed)
Sent to the pharmacy for 90 days.  Pt has upcoming cpx on 09/05/16.

## 2016-07-20 DIAGNOSIS — Z23 Encounter for immunization: Secondary | ICD-10-CM | POA: Diagnosis not present

## 2016-08-03 ENCOUNTER — Other Ambulatory Visit: Payer: Self-pay | Admitting: Internal Medicine

## 2016-08-03 NOTE — Telephone Encounter (Signed)
Ok to refill x 1  

## 2016-08-04 NOTE — Telephone Encounter (Signed)
Called to the pharmacy by e-scribe. 

## 2016-08-29 ENCOUNTER — Other Ambulatory Visit (INDEPENDENT_AMBULATORY_CARE_PROVIDER_SITE_OTHER): Payer: BLUE CROSS/BLUE SHIELD

## 2016-08-29 DIAGNOSIS — Z0000 Encounter for general adult medical examination without abnormal findings: Secondary | ICD-10-CM | POA: Diagnosis not present

## 2016-08-29 DIAGNOSIS — R739 Hyperglycemia, unspecified: Secondary | ICD-10-CM

## 2016-08-29 LAB — HEPATIC FUNCTION PANEL
ALBUMIN: 3.8 g/dL (ref 3.5–5.2)
ALK PHOS: 60 U/L (ref 39–117)
ALT: 36 U/L — AB (ref 0–35)
AST: 31 U/L (ref 0–37)
Bilirubin, Direct: 0 mg/dL (ref 0.0–0.3)
TOTAL PROTEIN: 7 g/dL (ref 6.0–8.3)
Total Bilirubin: 0.2 mg/dL (ref 0.2–1.2)

## 2016-08-29 LAB — CBC WITH DIFFERENTIAL/PLATELET
BASOS ABS: 0 10*3/uL (ref 0.0–0.1)
Basophils Relative: 0.5 % (ref 0.0–3.0)
EOS PCT: 2.3 % (ref 0.0–5.0)
Eosinophils Absolute: 0.1 10*3/uL (ref 0.0–0.7)
HCT: 37.8 % (ref 36.0–46.0)
HEMOGLOBIN: 12.8 g/dL (ref 12.0–15.0)
LYMPHS ABS: 1.7 10*3/uL (ref 0.7–4.0)
Lymphocytes Relative: 29.1 % (ref 12.0–46.0)
MCHC: 33.8 g/dL (ref 30.0–36.0)
MCV: 85.3 fl (ref 78.0–100.0)
MONO ABS: 0.3 10*3/uL (ref 0.1–1.0)
Monocytes Relative: 5 % (ref 3.0–12.0)
NEUTROS PCT: 63.1 % (ref 43.0–77.0)
Neutro Abs: 3.6 10*3/uL (ref 1.4–7.7)
Platelets: 279 10*3/uL (ref 150.0–400.0)
RBC: 4.44 Mil/uL (ref 3.87–5.11)
RDW: 12.2 % (ref 11.5–15.5)
WBC: 5.8 10*3/uL (ref 4.0–10.5)

## 2016-08-29 LAB — BASIC METABOLIC PANEL
BUN: 13 mg/dL (ref 6–23)
CALCIUM: 8.8 mg/dL (ref 8.4–10.5)
CO2: 27 mEq/L (ref 19–32)
Chloride: 105 mEq/L (ref 96–112)
Creatinine, Ser: 1 mg/dL (ref 0.40–1.20)
GFR: 65.63 mL/min (ref >60.00)
GLUCOSE: 100 mg/dL — AB (ref 70–99)
POTASSIUM: 3.7 meq/L (ref 3.5–5.1)
SODIUM: 139 meq/L (ref 135–145)

## 2016-08-29 LAB — HEMOGLOBIN A1C: HEMOGLOBIN A1C: 5.7 % (ref 4.6–6.5)

## 2016-08-29 LAB — LIPID PANEL
CHOLESTEROL: 187 mg/dL (ref 0–200)
HDL: 47.5 mg/dL (ref >39.00)
LDL CALC: 114 mg/dL — AB (ref 0–99)
NonHDL: 139.42
TRIGLYCERIDES: 126 mg/dL (ref 0.0–149.0)
Total CHOL/HDL Ratio: 4
VLDL: 25.2 mg/dL (ref 0.0–40.0)

## 2016-08-29 LAB — TSH: TSH: 3.34 u[IU]/mL (ref 0.35–4.50)

## 2016-09-01 ENCOUNTER — Encounter: Payer: Self-pay | Admitting: Internal Medicine

## 2016-09-01 NOTE — Progress Notes (Signed)
Pre visit review using our clinic review tool, if applicable. No additional management support is needed unless otherwise documented below in the visit note.  Chief Complaint  Patient presents with  . Annual Exam    HPI: Patient  Sandra Hudson  40 y.o. comes in today for [place] visit  And med management  Mood appears pretty stable on her current regimen. Headaches takes Imitrex about 2 times a month still seems menstrual related but doing better. On low dose OCPs not aggravating. Had gestational diabetes avoiding most sugars. Moved this past year teen still unpacking boxes larger house 47-year-old at home and older child.   Health Maintenance  Topic Date Due  . PNEUMOCOCCAL POLYSACCHARIDE VACCINE (1) 09/25/1979  . FOOT EXAM  09/25/1987  . OPHTHALMOLOGY EXAM  09/25/1987  . URINE MICROALBUMIN  09/16/2011  . PAP SMEAR  07/17/2016  . HEMOGLOBIN A1C  02/26/2017  . TETANUS/TDAP  02/02/2024  . INFLUENZA VACCINE  Addressed  . HIV Screening  Completed   Health Maintenance Review LIFESTYLE:  Exercise:  Active staris etc  Tobacco/ETS:n Alcohol: rare Sugar beverages: diet coke  Sleep: ocass ambien use  2 x per week or so Child gets up at night at times  ocass otc sleep aid Drug use: no HH of 4 Educated Audiological scientist     ROS:  GEN/ HEENT: No fever, significant weight changes sweats headaches vision problems hearing changes, CV/ PULM; No chest pain shortness of breath cough, syncope,edema  change in exercise tolerance. GI /GU: No adominal pain, vomiting, change in bowel habits. No blood in the stool. No significant GU symptoms. SKIN/HEME: ,no acute skin rashes suspicious lesions or bleeding. No lymphadenopathy, nodules, masses.  NEURO/ PSYCH:  No neurologic signs such as weakness numbness. No depression anxiety. IMM/ Allergy: No unusual infections.  Allergy .   REST of 12 system review negative except as per HPI   Past Medical History:  Diagnosis Date  .  Allergic rhinitis due to dust   . Allergy   . Breech presentation 01/29/2014  . Female infertility   . Headache(784.0)   . Pre-eclampsia 01/29/2014  . Pregnancy induced hypertension   . Status post primary low transverse cesarean section 02/01/2014    Past Surgical History:  Procedure Laterality Date  . CESAREAN SECTION N/A 01/31/2014   Procedure: CESAREAN SECTION;  Surgeon: Delice Lesch, MD;  Location: [facility];  Service: Obstetrics;  Laterality: N/A;  . NASAL SEPTUM SURGERY     deviated septum and large turbinate    Family History  Problem Relation Age of Onset  . Depression Father   . Depression Mother   . Breast cancer      maternal great aunt  . Breast cancer Paternal Grandmother   . ALS Father     multiple system atropy    Social History   Social History  . Marital status: Married    Spouse name: N/A  . Number of children: 1  . Years of education: N/A   Occupational History  .  Unemployed   Social History Main Topics  . Smoking status: Never Smoker  . Smokeless tobacco: Never Used  . Alcohol use No     Comment: occasionally  . Drug use: No  . Sexual activity: Not Asked   Other Topics Concern  . 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    Outpatient Medications Prior to Visit  Medication Sig Dispense Refill  . buPROPion (WELLBUTRIN XL) 300 MG 24 hr tablet take 1 tablet by mouth every morning 90 tablet 1  . DULoxetine (CYMBALTA) 60 MG capsule take 1 capsule by mouth once daily 90 capsule 0  . FALMINA 0.1-20 MG-MCG tablet Take 1 tablet by mouth daily.  10  . SUMAtriptan (IMITREX) 100 MG tablet take 1 tablet by mouth REPEAT IN 2 HOURS IF NEEDED 27 tablet 1  . zolpidem (AMBIEN) 5 MG tablet take 1 tablet by mouth at bedtime if needed for sleep AVOID REGULAR USE 30 tablet 0   No facility-administered medications prior to visit.      EXAM:  BP 120/80 (BP  Location: Right Arm, Patient Position: Sitting, Cuff Size: Large)   Temp 98.2 F (36.8 C) (Oral)   Ht 5' 7.25" (1.708 m)   Wt 193 lb (87.5 kg)   BMI 30.00 kg/m   Body mass index is 30 kg/m.  Physical Exam: Vital signs reviewed MGN:OIBB is a well-developed well-nourished alert cooperative    who appearsr stated age in no acute distress.  HEENT: normocephalic atraumatic , Eyes: PERRL EOM's full, conjunctiva clear, Nares: paten,t no deformity discharge or tenderness., Ears: no deformity EAC's clear TMs with normal landmarks. Mouth: clear OP, no lesions, edema.  Moist mucous membranes. Dentition in adequate repair. NECK: supple without masses, thyromegaly or bruits. CHEST/PULM:  Clear to auscultation and percussion breath sounds equal no wheeze , rales or rhonchi. No chest wall deformities or tenderness.Breast: normal by inspection . No dimpling, discharge, masses, tenderness or discharge . CV: PMI is nondisplaced, S1 S2 no gallops, murmurs, rubs. Peripheral pulses are full without delay.No JVD .  ABDOMEN: Bowel sounds normal nontender  No guard or rebound, no hepato splenomegal no CVA tenderness.  No hernia. Extremtities:  No clubbing cyanosis or edema, no acute joint swelling or redness no focal atrophy NEURO:  Oriented x3, cranial nerves 3-12 appear to be intact, no obvious focal weakness,gait within normal limits no abnormal reflexes or asymmetrical SKIN: No acute rashes normal turgor, color, no bruising or petechiae. PSYCH: Oriented, good eye contact, no obvious depression anxiety, cognition and judgment appear normal. LN: no cervical axillary inguinal adenopathy  Lab Results  Component Value Date   WBC 5.8 08/29/2016   HGB 12.8 08/29/2016   HCT 37.8 08/29/2016   PLT 279.0 08/29/2016   GLUCOSE 100 (H) 08/29/2016   CHOL 187 08/29/2016   TRIG 126.0 08/29/2016   HDL 47.50 08/29/2016   LDLDIRECT 158.0 11/06/2012   LDLCALC 114 (H) 08/29/2016   ALT 36 (H) 08/29/2016   AST 31  08/29/2016   NA 139 08/29/2016   K 3.7 08/29/2016   CL 105 08/29/2016   CREATININE 1.00 08/29/2016   BUN 13 08/29/2016   CO2 27 08/29/2016   TSH 3.34 08/29/2016   HGBA1C 5.7 08/29/2016   MICROALBUR 1.1 09/15/2010   BP Readings from Last 3 Encounters:  09/05/16 120/80  12/25/15 124/74  08/21/15 124/80   Wt Readings from Last 3 Encounters:  09/05/16 193 lb (87.5 kg)  12/25/15 182 lb 12.8 oz (82.9 kg)  08/21/15 200 lb (90.7 kg)    ASSESSMENT AND PLAN:  Discussed the following assessment and plan:  Encounter for preventive health examination  Hyperglycemia  Periodic headache syndrome, not intractable  Medication management - no change in meds  bnefit more than risk  caution with ambien  Abnormal LFTs - minor blip check he serology lft in 6 months with a1c   Insomnia, unspecified type Neg hx transfusion has donate blood remote past   Low risk hep c but check  With lfts pre visit in 6 months  Trying to get weight back down  Patient Care Team: Burnis Medin, MD as PCP - General Melida Quitter, MD Donnel Saxon, CNM as Midwife Jerene Bears, MD (Gastroenterology) Devra Dopp, MD as Referring Physician (Dermatology) Patient Instructions  Continue lifestyle intervention healthy eating and exercise . Sleep    Hygiene .   Plan lab in 6 months and rov  Liver panel/ serology and hga 1c   Health Maintenance, Female Introduction Adopting a healthy lifestyle and getting preventive care can go a long way to promote health and wellness. Talk with your health care provider about what schedule of regular examinations is right for you. This is a good chance for you to check in with your provider about disease prevention and staying healthy. In between checkups, there are plenty of things you can do on your own. Experts have done a lot of research about which lifestyle changes and preventive measures are most likely to keep you healthy. Ask your health care provider for more  information. Weight and diet Eat a healthy diet  Be sure to include plenty of vegetables, fruits, low-fat dairy products, and lean protein.  Do not eat a lot of foods high in solid fats, added sugars, or salt.  Get regular exercise. This is one of the most important things you can do for your health.  Most adults should exercise for at least 150 minutes each week. The exercise should increase your heart rate and make you sweat (moderate-intensity exercise).  Most adults should also do strengthening exercises at least twice a week. This is in addition to the moderate-intensity exercise. Maintain a healthy weight  Body mass index (BMI) is a measurement that can be used to identify possible weight problems. It estimates body fat based on height and weight. Your health care provider can help determine your BMI and help you achieve or maintain a healthy weight.  For females 82 years of age and older:  A BMI below 18.5 is considered underweight.  A BMI of 18.5 to 24.9 is normal.  A BMI of 25 to 29.9 is considered overweight.  A BMI of 30 and above is considered obese. Watch levels of cholesterol and blood lipids  You should start having your blood tested for lipids and cholesterol at 39 years of age, then have this test every 5 years.  You may need to have your cholesterol levels checked more often if:  Your lipid or cholesterol levels are high.  You are older than 39 years of age.  You are at high risk for heart disease. Cancer screening Lung Cancer  Lung cancer screening is recommended for adults 77-61 years old who are at high risk for lung cancer because of a history of smoking.  A yearly low-dose CT scan of the lungs is recommended for people who:  Currently smoke.  Have quit within the past 15 years.  Have at least a 30-pack-year history of smoking. A pack year is smoking an average of one pack of cigarettes a day for 1 year.  Yearly screening should continue until  it has been 15 years since you quit.  Yearly screening should stop if you develop a health problem that would prevent you from  having lung cancer treatment. Breast Cancer  Practice breast self-awareness. This means understanding how your breasts normally appear and feel.  It also means doing regular breast self-exams. Let your health care provider know about any changes, no matter how small.  If you are in your 20s or 30s, you should have a clinical breast exam (CBE) by a health care provider every 1-3 years as part of a regular health exam.  If you are 45 or older, have a CBE every year. Also consider having a breast X-ray (mammogram) every year.  If you have a family history of breast cancer, talk to your health care provider about genetic screening.  If you are at high risk for breast cancer, talk to your health care provider about having an MRI and a mammogram every year.  Breast cancer gene (BRCA) assessment is recommended for women who have family members with BRCA-related cancers. BRCA-related cancers include:  Breast.  Ovarian.  Tubal.  Peritoneal cancers.  Results of the assessment will determine the need for genetic counseling and BRCA1 and BRCA2 testing. Cervical Cancer  Your health care provider may recommend that you be screened regularly for cancer of the pelvic organs (ovaries, uterus, and vagina). This screening involves a pelvic examination, including checking for microscopic changes to the surface of your cervix (Pap test). You may be encouraged to have this screening done every 3 years, beginning at age 68.  For women ages 38-65, health care providers may recommend pelvic exams and Pap testing every 3 years, or they may recommend the Pap and pelvic exam, combined with testing for human papilloma virus (HPV), every 5 years. Some types of HPV increase your risk of cervical cancer. Testing for HPV may also be done on women of any age with unclear Pap test  results.  Other health care providers may not recommend any screening for nonpregnant women who are considered low risk for pelvic cancer and who do not have symptoms. Ask your health care provider if a screening pelvic exam is right for you.  If you have had past treatment for cervical cancer or a condition that could lead to cancer, you need Pap tests and screening for cancer for at least 20 years after your treatment. If Pap tests have been discontinued, your risk factors (such as having a new sexual partner) need to be reassessed to determine if screening should resume. Some women have medical problems that increase the chance of getting cervical cancer. In these cases, your health care provider may recommend more frequent screening and Pap tests. Colorectal Cancer  This type of cancer can be detected and often prevented.  Routine colorectal cancer screening usually begins at 39 years of age and continues through 39 years of age.  Your health care provider may recommend screening at an earlier age if you have risk factors for colon cancer.  Your health care provider may also recommend using home test kits to check for hidden blood in the stool.  A small camera at the end of a tube can be used to examine your colon directly (sigmoidoscopy or colonoscopy). This is done to check for the earliest forms of colorectal cancer.  Routine screening usually begins at age 26.  Direct examination of the colon should be repeated every 5-10 years through 39 years of age. However, you may need to be screened more often if early forms of precancerous polyps or small growths are found. Skin Cancer  Check your skin from head to toe regularly.  Tell your health care provider about any new moles or changes in moles, especially if there is a change in a mole's shape or color.  Also tell your health care provider if you have a mole that is larger than the size of a pencil eraser.  Always use sunscreen.  Apply sunscreen liberally and repeatedly throughout the day.  Protect yourself by wearing long sleeves, pants, a wide-brimmed hat, and sunglasses whenever you are outside. Heart disease, diabetes, and high blood pressure  High blood pressure causes heart disease and increases the risk of stroke. High blood pressure is more likely to develop in:  People who have blood pressure in the high end of the normal range (130-139/85-89 mm Hg).  People who are overweight or obese.  People who are African American.  If you are 6-15 years of age, have your blood pressure checked every 3-5 years. If you are 71 years of age or older, have your blood pressure checked every year. You should have your blood pressure measured twice-once when you are at a hospital or clinic, and once when you are not at a hospital or clinic. Record the average of the two measurements. To check your blood pressure when you are not at a hospital or clinic, you can use:  An automated blood pressure machine at a pharmacy.  A home blood pressure monitor.  If you are between 73 years and 12 years old, ask your health care provider if you should take aspirin to prevent strokes.  Have regular diabetes screenings. This involves taking a blood sample to check your fasting blood sugar level.  If you are at a normal weight and have a low risk for diabetes, have this test once every three years after 39 years of age.  If you are overweight and have a high risk for diabetes, consider being tested at a younger age or more often. Preventing infection Hepatitis B  If you have a higher risk for hepatitis B, you should be screened for this virus. You are considered at high risk for hepatitis B if:  You were born in a country where hepatitis B is common. Ask your health care provider which countries are considered high risk.  Your parents were born in a high-risk country, and you have not been immunized against hepatitis B (hepatitis B  vaccine).  You have HIV or AIDS.  You use needles to inject street drugs.  You live with someone who has hepatitis B.  You have had sex with someone who has hepatitis B.  You get hemodialysis treatment.  You take certain medicines for conditions, including cancer, organ transplantation, and autoimmune conditions. Hepatitis C  Blood testing is recommended for:  Everyone born from [birth year] through 1964.  Anyone with known risk factors for hepatitis C. Sexually transmitted infections (STIs)  You should be screened for sexually transmitted infections (STIs) including gonorrhea and chlamydia if:  You are sexually active and are younger than 38 years of age.  You are older than 39 years of age and your health care provider tells you that you are at risk for this type of infection.  Your sexual activity has changed since you were last screened and you are at an increased risk for chlamydia or gonorrhea. Ask your health care provider if you are at risk.  If you do not have HIV, but are at risk, it may be recommended that you take a prescription medicine daily to prevent HIV infection. This is called pre-exposure prophylaxis (PrEP).  You are considered at risk if:  You are sexually active and do not regularly use condoms or know the HIV status of your partner(s).  You take drugs by injection.  You are sexually active with a partner who has HIV. Talk with your health care provider about whether you are at high risk of being infected with HIV. If you choose to begin PrEP, you should first be tested for HIV. You should then be tested every 3 months for as long as you are taking PrEP. Pregnancy  If you are premenopausal and you may become pregnant, ask your health care provider about preconception counseling.  If you may become pregnant, take 400 to 800 micrograms (mcg) of folic acid every day.  If you want to prevent pregnancy, talk to your health care provider about birth control  (contraception). Osteoporosis and menopause  Osteoporosis is a disease in which the bones lose minerals and strength with aging. This can result in serious bone fractures. Your risk for osteoporosis can be identified using a bone density scan.  If you are 17 years of age or older, or if you are at risk for osteoporosis and fractures, ask your health care provider if you should be screened.  Ask your health care provider whether you should take a calcium or vitamin D supplement to lower your risk for osteoporosis.  Menopause may have certain physical symptoms and risks.  Hormone replacement therapy may reduce some of these symptoms and risks. Talk to your health care provider about whether hormone replacement therapy is right for you. Follow these instructions at home:  Schedule regular health, dental, and eye exams.  Stay current with your immunizations.  Do not use any tobacco products including cigarettes, chewing tobacco, or electronic cigarettes.  If you are pregnant, do not drink alcohol.  If you are breastfeeding, limit how much and how often you drink alcohol.  Limit alcohol intake to no more than 1 drink per day for nonpregnant women. One drink equals 12 ounces of beer, 5 ounces of wine, or 1 ounces of hard liquor.  Do not use street drugs.  Do not share needles.  Ask your health care provider for help if you need support or information about quitting drugs.  Tell your health care provider if you often feel depressed.  Tell your health care provider if you have ever been abused or do not feel safe at home. This information is not intended to replace advice given to you by your health care provider. Make sure you discuss any questions you have with your health care provider. Document Released: 04/18/2011 Document Revised: 03/10/2016 Document Reviewed: 07/07/2015  2017 [employer]    Mariann Laster K. Adalyna Godbee M.D.

## 2016-09-05 ENCOUNTER — Ambulatory Visit (INDEPENDENT_AMBULATORY_CARE_PROVIDER_SITE_OTHER): Payer: BLUE CROSS/BLUE SHIELD | Admitting: Internal Medicine

## 2016-09-05 ENCOUNTER — Encounter: Payer: Self-pay | Admitting: Internal Medicine

## 2016-09-05 VITALS — BP 120/80 | Temp 98.2°F | Ht 67.25 in | Wt 193.0 lb

## 2016-09-05 DIAGNOSIS — R7989 Other specified abnormal findings of blood chemistry: Secondary | ICD-10-CM

## 2016-09-05 DIAGNOSIS — G4700 Insomnia, unspecified: Secondary | ICD-10-CM

## 2016-09-05 DIAGNOSIS — R945 Abnormal results of liver function studies: Secondary | ICD-10-CM

## 2016-09-05 DIAGNOSIS — G43C0 Periodic headache syndromes in child or adult, not intractable: Secondary | ICD-10-CM | POA: Diagnosis not present

## 2016-09-05 DIAGNOSIS — Z79899 Other long term (current) drug therapy: Secondary | ICD-10-CM | POA: Diagnosis not present

## 2016-09-05 DIAGNOSIS — Z0000 Encounter for general adult medical examination without abnormal findings: Secondary | ICD-10-CM | POA: Diagnosis not present

## 2016-09-05 DIAGNOSIS — R739 Hyperglycemia, unspecified: Secondary | ICD-10-CM

## 2016-09-05 NOTE — Patient Instructions (Addendum)
Continue lifestyle intervention healthy eating and exercise . Sleep    Hygiene .   Plan lab in 6 months and rov  Liver panel/ serology and hga 1c   Health Maintenance, Female Introduction Adopting a healthy lifestyle and getting preventive care can go a long way to promote health and wellness. Talk with your health care provider about what schedule of regular examinations is right for you. This is a good chance for you to check in with your provider about disease prevention and staying healthy. In between checkups, there are plenty of things you can do on your own. Experts have done a lot of research about which lifestyle changes and preventive measures are most likely to keep you healthy. Ask your health care provider for more information. Weight and diet Eat a healthy diet  Be sure to include plenty of vegetables, fruits, low-fat dairy products, and lean protein.  Do not eat a lot of foods high in solid fats, added sugars, or salt.  Get regular exercise. This is one of the most important things you can do for your health.  Most adults should exercise for at least 150 minutes each week. The exercise should increase your heart rate and make you sweat (moderate-intensity exercise).  Most adults should also do strengthening exercises at least twice a week. This is in addition to the moderate-intensity exercise. Maintain a healthy weight  Body mass index (BMI) is a measurement that can be used to identify possible weight problems. It estimates body fat based on height and weight. Your health care provider can help determine your BMI and help you achieve or maintain a healthy weight.  For females 40 years of age and older:  A BMI below 18.5 is considered underweight.  A BMI of 18.5 to 24.9 is normal.  A BMI of 25 to 29.9 is considered overweight.  A BMI of 30 and above is considered obese. Watch levels of cholesterol and blood lipids  You should start having your blood tested for  lipids and cholesterol at 39 years of age, then have this test every 5 years.  You may need to have your cholesterol levels checked more often if:  Your lipid or cholesterol levels are high.  You are older than 39 years of age.  You are at high risk for heart disease. Cancer screening Lung Cancer  Lung cancer screening is recommended for adults 93-33 years old who are at high risk for lung cancer because of a history of smoking.  A yearly low-dose CT scan of the lungs is recommended for people who:  Currently smoke.  Have quit within the past 15 years.  Have at least a 30-pack-year history of smoking. A pack year is smoking an average of one pack of cigarettes a day for 1 year.  Yearly screening should continue until it has been 15 years since you quit.  Yearly screening should stop if you develop a health problem that would prevent you from having lung cancer treatment. Breast Cancer  Practice breast self-awareness. This means understanding how your breasts normally appear and feel.  It also means doing regular breast self-exams. Let your health care provider know about any changes, no matter how small.  If you are in your 20s or 30s, you should have a clinical breast exam (CBE) by a health care provider every 1-3 years as part of a regular health exam.  If you are 98 or older, have a CBE every year. Also consider having a breast X-ray (  mammogram) every year.  If you have a family history of breast cancer, talk to your health care provider about genetic screening.  If you are at high risk for breast cancer, talk to your health care provider about having an MRI and a mammogram every year.  Breast cancer gene (BRCA) assessment is recommended for women who have family members with BRCA-related cancers. BRCA-related cancers include:  Breast.  Ovarian.  Tubal.  Peritoneal cancers.  Results of the assessment will determine the need for genetic counseling and BRCA1 and  BRCA2 testing. Cervical Cancer  Your health care provider may recommend that you be screened regularly for cancer of the pelvic organs (ovaries, uterus, and vagina). This screening involves a pelvic examination, including checking for microscopic changes to the surface of your cervix (Pap test). You may be encouraged to have this screening done every 3 years, beginning at age 70.  For women ages 76-65, health care providers may recommend pelvic exams and Pap testing every 3 years, or they may recommend the Pap and pelvic exam, combined with testing for human papilloma virus (HPV), every 5 years. Some types of HPV increase your risk of cervical cancer. Testing for HPV may also be done on women of any age with unclear Pap test results.  Other health care providers may not recommend any screening for nonpregnant women who are considered low risk for pelvic cancer and who do not have symptoms. Ask your health care provider if a screening pelvic exam is right for you.  If you have had past treatment for cervical cancer or a condition that could lead to cancer, you need Pap tests and screening for cancer for at least 20 years after your treatment. If Pap tests have been discontinued, your risk factors (such as having a new sexual partner) need to be reassessed to determine if screening should resume. Some women have medical problems that increase the chance of getting cervical cancer. In these cases, your health care provider may recommend more frequent screening and Pap tests. Colorectal Cancer  This type of cancer can be detected and often prevented.  Routine colorectal cancer screening usually begins at 39 years of age and continues through 39 years of age.  Your health care provider may recommend screening at an earlier age if you have risk factors for colon cancer.  Your health care provider may also recommend using home test kits to check for hidden blood in the stool.  A small camera at the end  of a tube can be used to examine your colon directly (sigmoidoscopy or colonoscopy). This is done to check for the earliest forms of colorectal cancer.  Routine screening usually begins at age 14.  Direct examination of the colon should be repeated every 5-10 years through 39 years of age. However, you may need to be screened more often if early forms of precancerous polyps or small growths are found. Skin Cancer  Check your skin from head to toe regularly.  Tell your health care provider about any new moles or changes in moles, especially if there is a change in a mole's shape or color.  Also tell your health care provider if you have a mole that is larger than the size of a pencil eraser.  Always use sunscreen. Apply sunscreen liberally and repeatedly throughout the day.  Protect yourself by wearing long sleeves, pants, a wide-brimmed hat, and sunglasses whenever you are outside. Heart disease, diabetes, and high blood pressure  High blood pressure causes heart disease  and increases the risk of stroke. High blood pressure is more likely to develop in:  People who have blood pressure in the high end of the normal range (130-139/85-89 mm Hg).  People who are overweight or obese.  People who are African American.  If you are 67-1 years of age, have your blood pressure checked every 3-5 years. If you are 65 years of age or older, have your blood pressure checked every year. You should have your blood pressure measured twice-once when you are at a hospital or clinic, and once when you are not at a hospital or clinic. Record the average of the two measurements. To check your blood pressure when you are not at a hospital or clinic, you can use:  An automated blood pressure machine at a pharmacy.  A home blood pressure monitor.  If you are between 61 years and 72 years old, ask your health care provider if you should take aspirin to prevent strokes.  Have regular diabetes screenings.  This involves taking a blood sample to check your fasting blood sugar level.  If you are at a normal weight and have a low risk for diabetes, have this test once every three years after 39 years of age.  If you are overweight and have a high risk for diabetes, consider being tested at a younger age or more often. Preventing infection Hepatitis B  If you have a higher risk for hepatitis B, you should be screened for this virus. You are considered at high risk for hepatitis B if:  You were born in a country where hepatitis B is common. Ask your health care provider which countries are considered high risk.  Your parents were born in a high-risk country, and you have not been immunized against hepatitis B (hepatitis B vaccine).  You have HIV or AIDS.  You use needles to inject street drugs.  You live with someone who has hepatitis B.  You have had sex with someone who has hepatitis B.  You get hemodialysis treatment.  You take certain medicines for conditions, including cancer, organ transplantation, and autoimmune conditions. Hepatitis C  Blood testing is recommended for:  Everyone born from [birth year] through 1964.  Anyone with known risk factors for hepatitis C. Sexually transmitted infections (STIs)  You should be screened for sexually transmitted infections (STIs) including gonorrhea and chlamydia if:  You are sexually active and are younger than 39 years of age.  You are older than 39 years of age and your health care provider tells you that you are at risk for this type of infection.  Your sexual activity has changed since you were last screened and you are at an increased risk for chlamydia or gonorrhea. Ask your health care provider if you are at risk.  If you do not have HIV, but are at risk, it may be recommended that you take a prescription medicine daily to prevent HIV infection. This is called pre-exposure prophylaxis (PrEP). You are considered at risk if:  You are  sexually active and do not regularly use condoms or know the HIV status of your partner(s).  You take drugs by injection.  You are sexually active with a partner who has HIV. Talk with your health care provider about whether you are at high risk of being infected with HIV. If you choose to begin PrEP, you should first be tested for HIV. You should then be tested every 3 months for as long as you are taking PrEP. Pregnancy  If you are premenopausal and you may become pregnant, ask your health care provider about preconception counseling.  If you may become pregnant, take 400 to 800 micrograms (mcg) of folic acid every day.  If you want to prevent pregnancy, talk to your health care provider about birth control (contraception). Osteoporosis and menopause  Osteoporosis is a disease in which the bones lose minerals and strength with aging. This can result in serious bone fractures. Your risk for osteoporosis can be identified using a bone density scan.  If you are 33 years of age or older, or if you are at risk for osteoporosis and fractures, ask your health care provider if you should be screened.  Ask your health care provider whether you should take a calcium or vitamin D supplement to lower your risk for osteoporosis.  Menopause may have certain physical symptoms and risks.  Hormone replacement therapy may reduce some of these symptoms and risks. Talk to your health care provider about whether hormone replacement therapy is right for you. Follow these instructions at home:  Schedule regular health, dental, and eye exams.  Stay current with your immunizations.  Do not use any tobacco products including cigarettes, chewing tobacco, or electronic cigarettes.  If you are pregnant, do not drink alcohol.  If you are breastfeeding, limit how much and how often you drink alcohol.  Limit alcohol intake to no more than 1 drink per day for nonpregnant women. One drink equals 12 ounces of  beer, 5 ounces of wine, or 1 ounces of hard liquor.  Do not use street drugs.  Do not share needles.  Ask your health care provider for help if you need support or information about quitting drugs.  Tell your health care provider if you often feel depressed.  Tell your health care provider if you have ever been abused or do not feel safe at home. This information is not intended to replace advice given to you by your health care provider. Make sure you discuss any questions you have with your health care provider. Document Released: 04/18/2011 Document Revised: 03/10/2016 Document Reviewed: 07/07/2015  2017 Elsevier

## 2016-10-11 ENCOUNTER — Other Ambulatory Visit: Payer: Self-pay | Admitting: Internal Medicine

## 2016-10-11 NOTE — Telephone Encounter (Signed)
Sent to the pharmacy by e-scribe. 

## 2016-10-19 ENCOUNTER — Other Ambulatory Visit: Payer: Self-pay | Admitting: Internal Medicine

## 2016-10-20 NOTE — Telephone Encounter (Signed)
Sent to the pharmacy by e-scribe. 

## 2016-12-16 DIAGNOSIS — H5213 Myopia, bilateral: Secondary | ICD-10-CM | POA: Diagnosis not present

## 2017-01-01 ENCOUNTER — Other Ambulatory Visit: Payer: Self-pay | Admitting: Internal Medicine

## 2017-01-25 ENCOUNTER — Other Ambulatory Visit: Payer: Self-pay | Admitting: Internal Medicine

## 2017-02-27 ENCOUNTER — Other Ambulatory Visit (INDEPENDENT_AMBULATORY_CARE_PROVIDER_SITE_OTHER): Payer: BLUE CROSS/BLUE SHIELD

## 2017-02-27 ENCOUNTER — Other Ambulatory Visit: Payer: Self-pay | Admitting: Family Medicine

## 2017-02-27 DIAGNOSIS — R739 Hyperglycemia, unspecified: Secondary | ICD-10-CM | POA: Diagnosis not present

## 2017-02-27 DIAGNOSIS — R945 Abnormal results of liver function studies: Secondary | ICD-10-CM | POA: Diagnosis not present

## 2017-02-27 DIAGNOSIS — R7989 Other specified abnormal findings of blood chemistry: Secondary | ICD-10-CM

## 2017-02-27 LAB — HEPATIC FUNCTION PANEL
ALT: 32 U/L (ref 0–35)
AST: 25 U/L (ref 0–37)
Albumin: 4.1 g/dL (ref 3.5–5.2)
Alkaline Phosphatase: 58 U/L (ref 39–117)
Bilirubin, Direct: 0 mg/dL (ref 0.0–0.3)
Total Bilirubin: 0.3 mg/dL (ref 0.2–1.2)
Total Protein: 7 g/dL (ref 6.0–8.3)

## 2017-02-27 LAB — HEMOGLOBIN A1C: HEMOGLOBIN A1C: 6.2 % (ref 4.6–6.5)

## 2017-02-28 ENCOUNTER — Other Ambulatory Visit: Payer: Self-pay | Admitting: Internal Medicine

## 2017-02-28 LAB — HEPATITIS C ANTIBODY: HCV Ab: NEGATIVE

## 2017-02-28 LAB — HEPATITIS B SURFACE ANTIGEN: Hepatitis B Surface Ag: NEGATIVE

## 2017-02-28 LAB — HEPATITIS B SURFACE ANTIBODY,QUALITATIVE: HEP B S AB: NEGATIVE

## 2017-03-03 NOTE — Progress Notes (Signed)
Chief Complaint  Patient presents with  . Follow-up    HPI: Sandra Hudson 41 y.o. come in for Chronic disease management  HA doing ok  Less frequent MOOD:  meds help but  3 year ol and still in tranistion  challenging on times has gained weight back   But  Now on weight watches and has  A peer to do together which she feels is hopeful  Sometimes gets craving  BG dfue for screen   Had gest dm with one preg and not second with weight loss  ROS: See pertinent positives and negatives per HPI. No new sx .  No cp sob nueropathy   Past Medical History:  Diagnosis Date  . Allergic rhinitis due to dust   . Allergy   . Breech presentation 01/29/2014  . Female infertility   . Headache(784.0)   . Pre-eclampsia 01/29/2014  . Pregnancy induced hypertension   . Status post primary low transverse cesarean section 02/01/2014    Family History  Problem Relation Age of Onset  . Depression Father   . Depression Mother   . Breast cancer Unknown        maternal great aunt  . Breast cancer Paternal Grandmother   . ALS Father        multiple system atropy    Social History   Social History  . Marital status: Married    Spouse name: N/A  . Number of children: 1  . Years of education: N/A   Occupational History  .  Unemployed   Social History Main Topics  . Smoking status: Never Smoker  . Smokeless tobacco: Never Used  . Alcohol use No     Comment: occasionally  . Drug use: No  . Sexual activity: Not Asked   Other Topics Concern  . 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    Outpatient Medications Prior to Visit  Medication Sig Dispense Refill  . buPROPion (WELLBUTRIN XL) 300 MG 24 hr tablet take 1 tablet by mouth every morning 90 tablet 0  . DULoxetine (CYMBALTA) 60 MG capsule take 1 capsule by mouth once daily 90 capsule 1  . FALMINA 0.1-20 MG-MCG tablet Take 1 tablet by mouth  daily.  10  . SUMAtriptan (IMITREX) 100 MG tablet take 1 tablet by mouth immediately may repeat after 2 hours 27 tablet 1  . zolpidem (AMBIEN) 5 MG tablet take 1 tablet by mouth at bedtime if needed for sleep AVOID REGULAR USE 30 tablet 0  . OVER THE COUNTER MEDICATION [facility] OTC Sleep Aid     No facility-administered medications prior to visit.      EXAM:  BP 102/90 (BP Location: Right Arm, Patient Position: Sitting, Cuff Size: Normal)   Pulse 74   Temp 97.8 F (36.6 C) (Oral)   Ht 5' 7.25" (1.708 m)   Wt 201 lb 12.8 oz (91.5 kg)   BMI 31.37 kg/m   Body mass index is 31.37 kg/m.  GENERAL: vitals reviewed and listed above, alert, oriented, appears well hydrated and in no acute distress HEENT: atraumatic, conjunctiva  clear, no obvious abnormalities on inspection of external nose and earsPSYCH: pleasant and cooperative, no obvious depression or anxiety Lab Results  Component Value Date   WBC 5.8 08/29/2016   HGB 12.8 08/29/2016  HCT 37.8 08/29/2016   PLT 279.0 08/29/2016   GLUCOSE 100 (H) 08/29/2016   CHOL 187 08/29/2016   TRIG 126.0 08/29/2016   HDL 47.50 08/29/2016   LDLDIRECT 158.0 11/06/2012   LDLCALC 114 (H) 08/29/2016   ALT 32 02/27/2017   AST 25 02/27/2017   NA 139 08/29/2016   K 3.7 08/29/2016   CL 105 08/29/2016   CREATININE 1.00 08/29/2016   BUN 13 08/29/2016   CO2 27 08/29/2016   TSH 3.34 08/29/2016   HGBA1C 6.2 02/27/2017   MICROALBUR 1.1 09/15/2010   BP Readings from Last 3 Encounters:  03/06/17 102/90  09/05/16 120/80  12/25/15 124/74   Wt Readings from Last 3 Encounters:  03/06/17 201 lb 12.8 oz (91.5 kg)  09/05/16 193 lb (87.5 kg)  12/25/15 182 lb 12.8 oz (82.9 kg)    ASSESSMENT AND PLAN:  Discussed the following assessment and plan:  Hyperglycemia  Medication management  History of gestational diabetes - 1/2 pergnancy  Abnormal LFTs - resolved   Weight gain  Periodic headache syndrome, not intractable - stable lfts better   Serology neg Discussed in  getting back on intensification of lifestyle as it was helpful for her A1c is up compared to last time where was under 6 Because of her history of gestational diabetes she consider metformin in addition. Will rx  850 one by mouth daily then increase to 1 by mouth twice a day based on the studies however she does not tolerate GI wise we can reduce the  Se disc and can adjust   Send in message  Plan follow-up visit in 3-4 months A1c check anytime of day. Other strategies discussed to think she has a good balance of expectation no children at home get back with us earlier if needed. Continue Weight Watchers tracking. Total visit 25mins > 50% spent counseling and coordinating care as indicated in above note and in instructions to patient .   -Patient advised to return or notify health care team  if  new concerns arise.  Patient Instructions  Intensify lifestyle interventions.   Add metformin as a s discussed   Plan ROV in 3 -4 mos and a1c at the visit     [name] K. Cathye Kreiter M.D.

## 2017-03-06 ENCOUNTER — Encounter: Payer: Self-pay | Admitting: Internal Medicine

## 2017-03-06 ENCOUNTER — Ambulatory Visit (INDEPENDENT_AMBULATORY_CARE_PROVIDER_SITE_OTHER): Payer: BLUE CROSS/BLUE SHIELD | Admitting: Internal Medicine

## 2017-03-06 VITALS — BP 102/90 | HR 74 | Temp 97.8°F | Ht 67.25 in | Wt 201.8 lb

## 2017-03-06 DIAGNOSIS — G43C0 Periodic headache syndromes in child or adult, not intractable: Secondary | ICD-10-CM | POA: Diagnosis not present

## 2017-03-06 DIAGNOSIS — R739 Hyperglycemia, unspecified: Secondary | ICD-10-CM

## 2017-03-06 DIAGNOSIS — R635 Abnormal weight gain: Secondary | ICD-10-CM | POA: Diagnosis not present

## 2017-03-06 DIAGNOSIS — R7989 Other specified abnormal findings of blood chemistry: Secondary | ICD-10-CM

## 2017-03-06 DIAGNOSIS — R945 Abnormal results of liver function studies: Secondary | ICD-10-CM | POA: Diagnosis not present

## 2017-03-06 DIAGNOSIS — Z8632 Personal history of gestational diabetes: Secondary | ICD-10-CM

## 2017-03-06 DIAGNOSIS — Z79899 Other long term (current) drug therapy: Secondary | ICD-10-CM | POA: Diagnosis not present

## 2017-03-06 MED ORDER — METFORMIN HCL 850 MG PO TABS: 850.0000 mg | ORAL_TABLET | Freq: Every day | ORAL | 3 refills | Status: DC

## 2017-03-06 NOTE — Patient Instructions (Signed)
Intensify lifestyle interventions.   Add metformin as a s discussed   Plan ROV in 3 -4 mos and a1c at the visit

## 2017-04-03 ENCOUNTER — Other Ambulatory Visit: Payer: Self-pay | Admitting: Internal Medicine

## 2017-04-10 DIAGNOSIS — D1800 Hemangioma unspecified site: Secondary | ICD-10-CM | POA: Diagnosis not present

## 2017-04-10 DIAGNOSIS — L821 Other seborrheic keratosis: Secondary | ICD-10-CM | POA: Diagnosis not present

## 2017-04-10 DIAGNOSIS — D225 Melanocytic nevi of trunk: Secondary | ICD-10-CM | POA: Diagnosis not present

## 2017-04-10 DIAGNOSIS — L814 Other melanin hyperpigmentation: Secondary | ICD-10-CM | POA: Diagnosis not present

## 2017-04-14 ENCOUNTER — Other Ambulatory Visit: Payer: Self-pay | Admitting: Internal Medicine

## 2017-05-28 ENCOUNTER — Other Ambulatory Visit: Payer: Self-pay | Admitting: Internal Medicine

## 2017-06-08 DIAGNOSIS — L658 Other specified nonscarring hair loss: Secondary | ICD-10-CM | POA: Diagnosis not present

## 2017-07-01 ENCOUNTER — Other Ambulatory Visit: Payer: Self-pay | Admitting: Internal Medicine

## 2017-07-07 ENCOUNTER — Encounter: Payer: Self-pay | Admitting: Internal Medicine

## 2017-07-07 ENCOUNTER — Ambulatory Visit (INDEPENDENT_AMBULATORY_CARE_PROVIDER_SITE_OTHER): Payer: BLUE CROSS/BLUE SHIELD | Admitting: Internal Medicine

## 2017-07-07 VITALS — BP 118/88 | HR 82 | Temp 98.3°F | Wt 199.2 lb

## 2017-07-07 DIAGNOSIS — Z23 Encounter for immunization: Secondary | ICD-10-CM | POA: Diagnosis not present

## 2017-07-07 DIAGNOSIS — R739 Hyperglycemia, unspecified: Secondary | ICD-10-CM

## 2017-07-07 DIAGNOSIS — L658 Other specified nonscarring hair loss: Secondary | ICD-10-CM

## 2017-07-07 LAB — POCT GLYCOSYLATED HEMOGLOBIN (HGB A1C): Hemoglobin A1C: 5.6

## 2017-07-07 NOTE — Progress Notes (Signed)
Chief Complaint  Patient presents with  . Follow-up    Metformin added last OV, pt does not check sugars at home. No symptoms noted.    HPI: Sandra Hudson 41 y.o. come in for Chronic disease management    bg control she's been able to tolerate the metformin with occasional GI symptoms and occasional forgetting medicine. 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  No other changes in health. ROS: See pertinent positives and negatives per HPI.  Past Medical History:  Diagnosis Date  . Allergic rhinitis due to dust   . Allergy   . Breech presentation 01/29/2014  . Female infertility   . Headache(784.0)   . Pre-eclampsia 01/29/2014  . Pregnancy induced hypertension   . Status post primary low transverse cesarean section 02/01/2014    Family History  Problem Relation Age of Onset  . Depression Father   . Depression Mother   . Breast cancer Unknown        maternal great aunt  . Breast cancer Paternal Grandmother   . ALS Father        multiple system atropy    Social History   Social History  . Marital status: Married    Spouse name: N/A  . Number of children: 1  . Years of education: N/A   Occupational History  .  Unemployed   Social History Main Topics  . Smoking status: Never Smoker  . Smokeless tobacco: Never Used  . Alcohol use No     Comment: occasionally  . Drug use: No  . Sexual activity: Not Asked   Other Topics Concern  . 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    Outpatient Medications Prior to Visit  Medication Sig Dispense Refill  . buPROPion (WELLBUTRIN XL) 300 MG 24 hr tablet take 1 tablet by mouth every morning 90 tablet 0  . DULoxetine (CYMBALTA) 60 MG capsule take 1 capsule by mouth once daily 90 capsule  0  . metFORMIN (GLUCOPHAGE) 850 MG tablet take 1 tablet by mouth once daily WITH BREAKFAST, INCREASE TO TWICE A DAY WITH MEALS AFTER 3 TO 4 WEEKS 60 tablet 3  . OVER THE COUNTER MEDICATION [facility] OTC Sleep Aid    . SUMAtriptan (IMITREX) 100 MG tablet take 1 tablet by mouth immediately may repeat after 2 hours 27 tablet 1  . zolpidem (AMBIEN) 5 MG tablet take 1 tablet by mouth at bedtime if needed for sleep AVOID REGULAR USE 30 tablet 0  . FALMINA 0.1-20 MG-MCG tablet Take 1 tablet by mouth daily.  10   No facility-administered medications prior to visit.      EXAM:  BP 118/88 (BP Location: Right Arm, Patient Position: Sitting, Cuff Size: Normal)   Pulse 82   Temp 98.3 F (36.8 C) (Oral)   Wt 199 lb 3.2 oz (90.4 kg)   BMI 30.97 kg/m   Body mass index is 30.97 kg/m.  GENERAL: vitals reviewed and listed above, alert, oriented, appears well hydrated and in no acute distress HEENT: atraumatic, conjunctiva  clear, no obvious abnormalities on inspection of external nose and ears NECK: no obvious masses on inspection palpation  LUNGS:  clear to auscultation bilaterally, no wheezes, rales or rhonchi, good air movement CV: HRRR, no clubbing cyanosis or  peripheral edema nl cap refill  MS: moves all extremities without noticeable focal  abnormality PSYCH: pleasant and cooperative, no obvious depression or anxiety Lab Results  Component Value Date   WBC 5.8 08/29/2016   HGB 12.8 08/29/2016   HCT 37.8 08/29/2016   PLT 279.0 08/29/2016   GLUCOSE 100 (H) 08/29/2016   CHOL 187 08/29/2016   TRIG 126.0 08/29/2016   HDL 47.50 08/29/2016   LDLDIRECT 158.0 11/06/2012   LDLCALC 114 (H) 08/29/2016   ALT 32 02/27/2017   AST 25 02/27/2017   NA 139 08/29/2016   K 3.7 08/29/2016   CL 105 08/29/2016   CREATININE 1.00 08/29/2016   BUN 13 08/29/2016   CO2 27 08/29/2016   TSH 3.34 08/29/2016   HGBA1C 5.6 07/07/2017   MICROALBUR 1.1 09/15/2010   BP Readings from Last 3 Encounters:  07/07/17  118/88  03/06/17 102/90  09/05/16 120/80   Wt Readings from Last 3 Encounters:  07/07/17 199 lb 3.2 oz (90.4 kg)  03/06/17 201 lb 12.8 oz (91.5 kg)  09/05/16 193 lb (87.5 kg)     ASSESSMENT AND PLAN:  Discussed the following assessment and plan:  Hyperglycemia - Plan: POC HgB A1c  Female pattern baldness  Need for influenza vaccination - Plan: Flu Vaccine QUAD 6+ mos PF IM (Fluarix Quad PF) Blood sugar A1c much better tolerating medicine continue Have dermatology send [place] a copy of the notes evaluation and any lab work results they obtained for our records. Continue lifestyle discussed sleep interrupted because of small children has not been using Ambien considering asking for it again for time. -Patient advised to return or notify health care team  if  new concerns arise.  Patient Instructions  Number much better   Stay on metformin . Due for preventive visit end November December   But can do in cpx   January or February . 2019 .  Have derm send [place] reports and lab results  Neta Mends. Anayelli Lai M.D.

## 2017-07-07 NOTE — Patient Instructions (Addendum)
Number much better   Stay on metformin . Due for preventive visit end November December   But can do in cpx   January or February . 2019 .  Have derm send [place] reports and lab results

## 2017-07-08 ENCOUNTER — Other Ambulatory Visit: Payer: Self-pay | Admitting: Internal Medicine

## 2017-07-14 DIAGNOSIS — L658 Other specified nonscarring hair loss: Secondary | ICD-10-CM | POA: Diagnosis not present

## 2017-07-14 NOTE — Telephone Encounter (Signed)
Medication filled to pharmacy as requested.   

## 2017-07-15 ENCOUNTER — Other Ambulatory Visit: Payer: Self-pay | Admitting: Internal Medicine

## 2017-08-24 DIAGNOSIS — Z01419 Encounter for gynecological examination (general) (routine) without abnormal findings: Secondary | ICD-10-CM | POA: Diagnosis not present

## 2017-08-24 DIAGNOSIS — Z6830 Body mass index (BMI) 30.0-30.9, adult: Secondary | ICD-10-CM | POA: Diagnosis not present

## 2017-08-24 DIAGNOSIS — Z3040 Encounter for surveillance of contraceptives, unspecified: Secondary | ICD-10-CM | POA: Diagnosis not present

## 2017-08-25 ENCOUNTER — Other Ambulatory Visit: Payer: Self-pay | Admitting: Internal Medicine

## 2017-08-29 NOTE — Telephone Encounter (Signed)
Refill request for Medication: Sumatriptan 100mg  Last Filled: 05/30/17, #27 x 1 RF Previous / Upcoming Appt: 10/20/17 CPE  Please advise Dr Fabian SharpPanosh, thanks.

## 2017-09-17 ENCOUNTER — Other Ambulatory Visit: Payer: Self-pay | Admitting: Internal Medicine

## 2017-10-01 ENCOUNTER — Other Ambulatory Visit: Payer: Self-pay | Admitting: Internal Medicine

## 2017-10-17 ENCOUNTER — Other Ambulatory Visit: Payer: Self-pay | Admitting: Internal Medicine

## 2017-10-18 NOTE — Progress Notes (Deleted)
No chief complaint on file.   HPI: Patient  Sandra Hudson  42 y.o. comes in today for Preventive Health Care visit  And med check   Glucose  Mood   Health Maintenance  Topic Date Due  . PNEUMOCOCCAL POLYSACCHARIDE VACCINE (1) 09/25/1979  . FOOT EXAM  09/25/1987  . URINE MICROALBUMIN  09/16/2011  . PAP SMEAR  07/17/2016  . HEMOGLOBIN A1C  01/04/2018  . OPHTHALMOLOGY EXAM  03/17/2018  . TETANUS/TDAP  02/02/2024  . INFLUENZA VACCINE  Completed  . HIV Screening  Completed   Health Maintenance Review LIFESTYLE:  Exercise:   Tobacco/ETS: Alcohol:  Sugar beverages: Sleep: Drug use: no HH of  Work:    ROS:  GEN/ HEENT: No fever, significant weight changes sweats headaches vision problems hearing changes, CV/ PULM; No chest pain shortness of breath cough, syncope,edema  change in exercise tolerance. GI /GU: No adominal pain, vomiting, change in bowel habits. No blood in the stool. No significant GU symptoms. SKIN/HEME: ,no acute skin rashes suspicious lesions or bleeding. No lymphadenopathy, nodules, masses.  NEURO/ PSYCH:  No neurologic signs such as weakness numbness. No depression anxiety. IMM/ Allergy: No unusual infections.  Allergy .   REST of 12 system review negative except as per HPI   Past Medical History:  Diagnosis Date  . Allergic rhinitis due to dust   . Allergy   . Breech presentation 01/29/2014  . Female infertility   . Headache(784.0)   . Pre-eclampsia 01/29/2014  . Pregnancy induced hypertension   . Status post primary low transverse cesarean section 02/01/2014    Past Surgical History:  Procedure Laterality Date  . CESAREAN SECTION N/A 01/31/2014   Procedure: CESAREAN SECTION;  Surgeon: Purcell Nails, MD;  Location: [facility];  Service: Obstetrics;  Laterality: N/A;  . NASAL SEPTUM SURGERY     deviated septum and large turbinate    Family History  Problem Relation Age of Onset  . Depression Father   . Depression Mother   . Breast cancer  Unknown        maternal great aunt  . Breast cancer Paternal Grandmother   . ALS Father        multiple system atropy    Social History   Socioeconomic History  . Marital status: Married    Spouse name: Not on file  . Number of children: 1  . Years of education: Not on file  . Highest education level: Not on file  Social Needs  . Financial resource strain: Not on file  . Food insecurity - worry: Not on file  . Food insecurity - inability: Not on file  . Transportation needs - medical: Not on file  . Transportation needs - non-medical: Not on file  Occupational History    Employer: UNEMPLOYED  Tobacco Use  . Smoking status: Never Smoker  . Smokeless tobacco: Never Used  Substance and Sexual Activity  . Alcohol use: No    Comment: occasionally  . Drug use: No  . Sexual activity: Not on file  Other Topics Concern  . 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    Outpatient Medications Prior to Visit  Medication Sig Dispense Refill  . buPROPion (WELLBUTRIN XL) 300 MG 24 hr tablet take 1 tablet by mouth every morning 90  tablet 0  . DULoxetine (CYMBALTA) 60 MG capsule take 1 capsule by mouth once daily 90 capsule 1  . FALMINA 0.1-20 MG-MCG tablet Take 1 tablet by mouth daily.  10  . metFORMIN (GLUCOPHAGE) 850 MG tablet take 1 tablet by mouth once daily WITH BREAKFAST, INCREASE TO TWICE A DAY WITH MEALS AFTER 3 TO 4 WEEKS 60 tablet 3  . OVER THE COUNTER MEDICATION [employer] OTC Sleep Aid    . SUMAtriptan (IMITREX) 100 MG tablet take 1 tablet by mouth immediately may repeat after 2 hours 27 tablet 0  . zolpidem (AMBIEN) 5 MG tablet take 1 tablet by mouth at bedtime if needed for sleep AVOID REGULAR USE 30 tablet 0   No facility-administered medications prior to visit.      EXAM:  There were no vitals taken for this visit.  There is no height or weight on file to  calculate BMI. Wt Readings from Last 3 Encounters:  07/07/17 199 lb 3.2 oz (90.4 kg)  03/06/17 201 lb 12.8 oz (91.5 kg)  09/05/16 193 lb (87.5 kg)    Physical Exam: Vital signs reviewed [id number]:This is a well-developed well-nourished alert cooperative    who appearsr stated age in no acute distress.  HEENT: normocephalic atraumatic , Eyes: PERRL EOM's full, conjunctiva clear, Nares: paten,t no deformity discharge or tenderness., Ears: no deformity EAC's clear TMs with normal landmarks. Mouth: clear OP, no lesions, edema.  Moist mucous membranes. Dentition in adequate repair. NECK: supple without masses, thyromegaly or bruits. CHEST/PULM:  Clear to auscultation and percussion breath sounds equal no wheeze , rales or rhonchi. No chest wall deformities or tenderness. Breast: normal by inspection . No dimpling, discharge, masses, tenderness or discharge . CV: PMI is nondisplaced, S1 S2 no gallops, murmurs, rubs. Peripheral pulses are full without delay.No JVD .  ABDOMEN: Bowel sounds normal nontender  No guard or rebound, no hepato splenomegal no CVA tenderness.  No hernia. Extremtities:  No clubbing cyanosis or edema, no acute joint swelling or redness no focal atrophy NEURO:  Oriented x3, cranial nerves 3-12 appear to be intact, no obvious focal weakness,gait within normal limits no abnormal reflexes or asymmetrical SKIN: No acute rashes normal turgor, color, no bruising or petechiae. PSYCH: Oriented, good eye contact, no obvious depression anxiety, cognition and judgment appear normal. LN: no cervical axillary inguinal adenopathy  Lab Results  Component Value Date   WBC 5.8 08/29/2016   HGB 12.8 08/29/2016   HCT 37.8 08/29/2016   PLT 279.0 08/29/2016   GLUCOSE 100 (H) 08/29/2016   CHOL 187 08/29/2016   TRIG 126.0 08/29/2016   HDL 47.50 08/29/2016   LDLDIRECT 158.0 11/06/2012   LDLCALC 114 (H) 08/29/2016   ALT 32 02/27/2017   AST 25 02/27/2017   NA 139 08/29/2016   K 3.7 08/29/2016    CL 105 08/29/2016   CREATININE 1.00 08/29/2016   BUN 13 08/29/2016   CO2 27 08/29/2016   TSH 3.34 08/29/2016   HGBA1C 5.6 07/07/2017   MICROALBUR 1.1 09/15/2010    BP Readings from Last 3 Encounters:  07/07/17 118/88  03/06/17 102/90  09/05/16 120/80    Lab results reviewed with patient   ASSESSMENT AND PLAN:  Discussed the following assessment and plan:  No diagnosis found. Labs and med check  Patient Care Team: Farrie Sann, Neta [name], MD as PCP - Amedeo GoryGeneral Bates, Dwight, MD Nigel BridgemanLatham, Vicki, CNM as Midwife Pyrtle, Carie CaddyJay M, MD (Gastroenterology) Haverstock, Elvin Sohristina L, MD as Referring Physician (Dermatology) There are no  Patient Instructions on file for this visit.  Standley Brooking. Carmeline Kowal M.D.

## 2017-10-20 ENCOUNTER — Encounter: Payer: BLUE CROSS/BLUE SHIELD | Admitting: Internal Medicine

## 2017-11-02 NOTE — Progress Notes (Signed)
Chief Complaint  Patient presents with  . Annual Exam    no new concerns    HPI: Patient  Sandra Hudson  42 y.o. comes in today for Preventive Health Care visit  And  Poss from  Sleep and neck .  Has some lately   Metformin bid  Misses  Second dose  Up to 3 x per   No se  Would like   Ambient  o try as needed.on  weekend   At times . Mood ok   overwhelmed at times   2 small children      Health Maintenance  Topic Date Due  . PNEUMOCOCCAL POLYSACCHARIDE VACCINE (1) 09/25/1979  . FOOT EXAM  09/25/1987  . URINE MICROALBUMIN  09/16/2011  . PAP SMEAR  07/17/2016  . HEMOGLOBIN A1C  01/04/2018  . OPHTHALMOLOGY EXAM  03/17/2018  . TETANUS/TDAP  02/02/2024  . INFLUENZA VACCINE  Completed  . HIV Screening  Completed   Health Maintenance Review LIFESTYLE:  Exercise:    girsl scout  Cookies   No formal . Active .  Tobacco/ETS: no Alcohol:   no Sugar beverages: diet     Sleep:  Would like  ambien  On weekends  If needs some .  Children sleep in bed and hard to sleep Drug use: no HH of   4    2 cats.  Periods normal  And ok .    ROS:  GEN/ HEENT: No fever, significant weight changes sweats headaches vision problems hearing changes, CV/ PULM; No chest pain shortness of breath cough, syncope,edema  change in exercise tolerance. GI /GU: No adominal pain, vomiting, change in bowel habits. No blood in the stool. No significant GU symptoms. SKIN/HEME: ,no acute skin rashes suspicious lesions or bleeding. No lymphadenopathy, nodules, masses.  NEURO/ PSYCH:  No neurologic signs such as weakness numbness. No depression anxiety. IMM/ Allergy: No unusual infections.  Allergy .   REST of 12 system review negative except as per HPI   Past Medical History:  Diagnosis Date  . Allergic rhinitis due to dust   . Allergy   . Breech presentation 01/29/2014  . Female infertility   . Headache(784.0)   . Pre-eclampsia 01/29/2014  . Pregnancy induced hypertension   . Status post primary low  transverse cesarean section 02/01/2014    Past Surgical History:  Procedure Laterality Date  . CESAREAN SECTION N/A 01/31/2014   Procedure: CESAREAN SECTION;  Surgeon: Purcell Nails, MD;  Location: [facility];  Service: Obstetrics;  Laterality: N/A;  . NASAL SEPTUM SURGERY     deviated septum and large turbinate    Family History  Problem Relation Age of Onset  . Depression Father   . Depression Mother   . Breast cancer Unknown        maternal great aunt  . Breast cancer Paternal Grandmother   . ALS Father        multiple system atropy    Social History   Socioeconomic History  . Marital status: Married    Spouse name: Not on file  . Number of children: 1  . Years of education: Not on file  . Highest education level: Not on file  Social Needs  . Financial resource strain: Not on file  . Food insecurity - worry: Not on file  . Food insecurity - inability: Not on file  . Transportation needs - medical: Not on file  . Transportation needs - non-medical: Not on file  Occupational History  Employer: UNEMPLOYED  Tobacco Use  . Smoking status: Never Smoker  . Smokeless tobacco: Never Used  Substance and Sexual Activity  . Alcohol use: No    Comment: occasionally  . Drug use: No  . Sexual activity: Not on file  Other Topics Concern  . 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    Outpatient Medications Prior to Visit  Medication Sig Dispense Refill  . Biotin (BIOTIN 5000) 5 MG CAPS Take 1 capsule by mouth daily.    Marland Kitchen buPROPion (WELLBUTRIN XL) 300 MG 24 hr tablet take 1 tablet by mouth every morning 90 tablet 0  . cetirizine (ZYRTEC) 10 MG tablet Take 10 mg by mouth daily.    . Cholecalciferol (VITAMIN D3) 2000 units TABS Take 1 tablet by mouth daily.    . DULoxetine (CYMBALTA) 60 MG capsule take 1 capsule by mouth once daily 90 capsule 1  . FALMINA 0.1-20 MG-MCG  tablet Take 1 tablet by mouth daily.  10  . metFORMIN (GLUCOPHAGE) 850 MG tablet take 1 tablet by mouth once daily WITH BREAKFAST, INCREASE TO TWICE A DAY WITH MEALS AFTER 3 TO 4 WEEKS 60 tablet 3  . OVER THE COUNTER MEDICATION [employer] [facility] OTC Sleep Aid    . spironolactone (ALDACTONE) 100 MG tablet Take 100 mg by mouth 2 (two) times daily.    . SUMAtriptan (IMITREX) 100 MG tablet take 1 tablet by mouth immediately may repeat after 2 hours 27 tablet 0  . zinc gluconate 50 MG tablet Take 50 mg by mouth daily.    Marland Kitchen zolpidem (AMBIEN) 5 MG tablet take 1 tablet by mouth at bedtime if needed for sleep AVOID REGULAR USE 30 tablet 0   No facility-administered medications prior to visit.      EXAM:  LMP 11/01/2017 (Exact Date)   There is no height or weight on file to calculate BMI. Wt Readings from Last 3 Encounters:  07/07/17 199 lb 3.2 oz (90.4 kg)  03/06/17 201 lb 12.8 oz (91.5 kg)  09/05/16 193 lb (87.5 kg)    Physical Exam: Vital signs reviewed [id number] is a well-developed well-nourished alert cooperative    who appearsr stated age in no acute distress.  HEENT: normocephalic atraumatic , Eyes: PERRL EOM's full, conjunctiva clear, Nares: paten,t no deformity discharge or tenderness., Ears: no deformity EAC's clear TMs with normal landmarks. Mouth: clear OP, no lesions, edema.  Moist mucous membranes. Dentition in adequate repair. NECK: supple without masses, thyromegaly or bruits. CHEST/PULM:  Clear to auscultation and percussion breath sounds equal no wheeze , rales or rhonchi. No chest wall deformities or tenderness. Breast: normal by inspection . No dimpling, discharge, masses, tenderness or discharge . CV: PMI is nondisplaced, S1 S2 no gallops, murmurs, rubs. Peripheral pulses are full without delay.No JVD .  ABDOMEN: Bowel sounds normal nontender  No guard or rebound, no hepato splenomegal no CVA tenderness.  No hernia. Extremtities:  No clubbing cyanosis or edema, no acute joint  swelling or redness no focal atrophy NEURO:  Oriented x3, cranial nerves 3-12 appear to be intact, no obvious focal weakness,gait within normal limits no abnormal reflexes or asymmetrical SKIN: No acute rashes normal turgor, color, no bruising or petechiae. PSYCH: Oriented, good eye contact, no obvious depression anxiety, cognition and judgment appear normal. LN: no cervical  axillary inguinal adenopathy  Lab Results  Component Value Date   WBC 5.8 08/29/2016   HGB 12.8 08/29/2016   HCT 37.8 08/29/2016   PLT 279.0 08/29/2016   GLUCOSE 100 (H) 08/29/2016   CHOL 187 08/29/2016   TRIG 126.0 08/29/2016   HDL 47.50 08/29/2016   LDLDIRECT 158.0 11/06/2012   LDLCALC 114 (H) 08/29/2016   ALT 32 02/27/2017   AST 25 02/27/2017   NA 139 08/29/2016   K 3.7 08/29/2016   CL 105 08/29/2016   CREATININE 1.00 08/29/2016   BUN 13 08/29/2016   CO2 27 08/29/2016   TSH 3.34 08/29/2016   HGBA1C 5.6 07/07/2017   MICROALBUR 1.1 09/15/2010    BP Readings from Last 3 Encounters:  07/07/17 118/88  03/06/17 102/90  09/05/16 120/80    Lab results reviewed with patient   ASSESSMENT AND PLAN:  Discussed the following assessment and plan:  Visit for preventive health examination - Plan: CBC with Differential/Platelet, Hemoglobin A1c, Hepatic function panel, Lipid panel, Basic metabolic panel, TSH  Medication management - Plan: CBC with Differential/Platelet, Hemoglobin A1c, Hepatic function panel, Lipid panel, Basic metabolic panel, TSH  Hyperglycemia - Plan: CBC with Differential/Platelet, Hemoglobin A1c, Hepatic function panel, Lipid panel, Basic metabolic panel, TSH  History of gestational diabetes - on metformin - Plan: CBC with Differential/Platelet, Hemoglobin A1c, Hepatic function panel, Lipid panel, Basic metabolic panel, TSH  Insomnia, unspecified type - risk benefit aware  - Plan: CBC with Differential/Platelet, Hemoglobin A1c, Hepatic function panel, Lipid panel, Basic metabolic panel,  TSH  labs  Non fasting today    Med check  At a [place] about 1 hour pre visit  Patient Care Team: [name], MD as PCP - Amedeo Gory, MD Nigel Bridgeman, CNM as Midwife Pyrtle, Carie Caddy, MD (Gastroenterology) Haverstock, Elvin So, MD as Referring Physician (Dermatology) Patient Instructions   Will notify you  of labs when available.  If all ok then ROV 6 months and recheck a1c sugar control or as needed    Preventing Type 2 Diabetes Mellitus Type 2 diabetes (type 2 diabetes mellitus) is a long-term (chronic) disease that affects blood sugar (glucose) levels. Normally, a hormone called insulin allows glucose to enter cells in the body. The cells use glucose for energy. In type 2 diabetes, one or both of these problems may be present:  The body does not make enough insulin.  The body does not respond properly to insulin that it makes (insulin resistance).  Insulin resistance or lack of insulin causes excess glucose to build up in the blood instead of going into cells. As a result, high blood glucose (hyperglycemia) develops, which can cause many complications. Being overweight or obese and having an inactive (sedentary) lifestyle can increase your risk for diabetes. Type 2 diabetes can be delayed or prevented by making certain nutrition and lifestyle changes. What nutrition changes can be made?  Eat healthy meals and snacks regularly. Keep a healthy snack with you for when you get hungry between meals, such as fruit or a handful of nuts.  Eat lean meats and proteins that are low in saturated fats, such as chicken, fish, egg whites, and beans. Avoid processed meats.  Eat plenty of fruits and vegetables and plenty of grains that have not been processed (whole grains). It is recommended that you eat: ? 1?2 cups of fruit every day. ? 2?3 cups of vegetables every day. ? 6?8 oz of whole grains every day, such as oats, whole wheat, bulgur,  brown rice, quinoa, and  millet.  Eat low-fat dairy products, such as milk, yogurt, and cheese.  Eat foods that contain healthy fats, such as nuts, avocado, olive oil, and canola oil.  Drink water throughout the day. Avoid drinks that contain added sugar, such as soda or sweet tea.  Follow instructions from your health care provider about specific eating or drinking restrictions.  Control how much food you eat at a time (portion size). ? Check food labels to find out the serving sizes of foods. ? Use a kitchen scale to weigh amounts of foods.  Saute or steam food instead of frying it. Cook with water or broth instead of oils or butter.  Limit your intake of: ? Salt (sodium). Have no more than 1 tsp (2,400 mg) of sodium a day. If you have heart disease or high blood pressure, have less than ? tsp (1,500 mg) of sodium a day. ? Saturated fat. This is fat that is solid at room temperature, such as butter or fat on meat. What lifestyle changes can be made?  Activity  Do moderate-intensity physical activity for at least 30 minutes on at least 5 days of the week, or as much as told by your health care provider.  Ask your health care provider what activities are safe for you. A mix of physical activities may be best, such as walking, swimming, cycling, and strength training.  Try to add physical activity into your day. For example: ? Park in spots that are farther away than usual, so that you walk more. For example, park in a far corner of the parking lot when you go to the office or the grocery store. ? Take a walk during your lunch break. ? Use stairs instead of elevators or escalators. Weight Loss  Lose weight as directed. Your health care provider can determine how much weight loss is best for you and can help you lose weight safely.  If you are overweight or obese, you may be instructed to lose at least 5?7 % of your body weight. Alcohol and Tobacco   Limit alcohol intake to no more than 1 drink a day  for nonpregnant women and 2 drinks a day for men. One drink equals 12 oz of beer, 5 oz of wine, or 1 oz of hard liquor.  Do not use any tobacco products, such as cigarettes, chewing tobacco, and e-cigarettes. If you need help quitting, ask your health care provider. Work With Your Health Care Provider  Have your blood glucose tested regularly, as told by your health care provider.  Discuss your risk factors and how you can reduce your risk for diabetes.  Get screening tests as told by your health care provider. You may have screening tests regularly, especially if you have certain risk factors for type 2 diabetes.  Make an appointment with a diet and nutrition specialist (registered dietitian). A registered dietitian can help you make a healthy eating plan and can help you understand portion sizes and food labels. Why are these changes important?  It is possible to prevent or delay type 2 diabetes and related health problems by making lifestyle and nutrition changes.  It can be difficult to recognize signs of type 2 diabetes. The best way to avoid possible damage to your body is to take actions to prevent the disease before you develop symptoms. What can happen if changes are not made?  Your blood glucose levels may keep increasing. Having high blood glucose for a long time  is dangerous. Too much glucose in your blood can damage your blood vessels, heart, kidneys, nerves, and eyes.  You may develop prediabetes or type 2 diabetes. Type 2 diabetes can lead to many chronic health problems and complications, such as: ? Heart disease. ? Stroke. ? Blindness. ? Kidney disease. ? Depression. ? Poor circulation in the feet and legs, which could lead to surgical removal (amputation) in severe cases. Where to find support:  Ask your health care provider to recommend a registered dietitian, diabetes educator, or weight loss program.  Look for local or online weight loss groups.  Join a gym,  fitness club, or outdoor activity group, such as a walking club. Where to find more information: To learn more about diabetes and diabetes prevention, visit:  American Diabetes Association (ADA): www.diabetes.[employer] of Diabetes and Digestive and Kidney Diseases: ToyArticles.ca  To learn more about healthy eating, visit:  The U.S. Department of Agriculture Architect), Choose My Plate: http://yates.biz/  Office of Disease Prevention and Health Promotion (ODPHP), Dietary Guidelines: ListingMagazine.si  Summary  You can reduce your risk for type 2 diabetes by increasing your physical activity, eating healthy foods, and losing weight as directed.  Talk with your health care provider about your risk for type 2 diabetes. Ask about any blood tests or screening tests that you need to have. This information is not intended to replace advice given to you by your health care provider. Make sure you discuss any questions you have with your health care provider. Document Released: 01/25/2016 Document Revised: 03/10/2016 Document Reviewed: 11/24/2015 Elsevier Interactive Patient Education  2018 [employer].    [place]. Viriginia Amendola M.D.

## 2017-11-03 ENCOUNTER — Encounter: Payer: Self-pay | Admitting: Internal Medicine

## 2017-11-03 ENCOUNTER — Ambulatory Visit (INDEPENDENT_AMBULATORY_CARE_PROVIDER_SITE_OTHER): Payer: BLUE CROSS/BLUE SHIELD | Admitting: Internal Medicine

## 2017-11-03 DIAGNOSIS — Z0000 Encounter for general adult medical examination without abnormal findings: Secondary | ICD-10-CM

## 2017-11-03 DIAGNOSIS — Z8632 Personal history of gestational diabetes: Secondary | ICD-10-CM | POA: Diagnosis not present

## 2017-11-03 DIAGNOSIS — Z79899 Other long term (current) drug therapy: Secondary | ICD-10-CM

## 2017-11-03 DIAGNOSIS — R739 Hyperglycemia, unspecified: Secondary | ICD-10-CM | POA: Diagnosis not present

## 2017-11-03 DIAGNOSIS — G4700 Insomnia, unspecified: Secondary | ICD-10-CM | POA: Diagnosis not present

## 2017-11-03 LAB — CBC WITH DIFFERENTIAL/PLATELET
BASOS PCT: 0.6 % (ref 0.0–3.0)
Basophils Absolute: 0 10*3/uL (ref 0.0–0.1)
EOS ABS: 0.2 10*3/uL (ref 0.0–0.7)
Eosinophils Relative: 3.1 % (ref 0.0–5.0)
HCT: 42.2 % (ref 36.0–46.0)
Hemoglobin: 14 g/dL (ref 12.0–15.0)
LYMPHS ABS: 2 10*3/uL (ref 0.7–4.0)
Lymphocytes Relative: 30.1 % (ref 12.0–46.0)
MCHC: 33.3 g/dL (ref 30.0–36.0)
MCV: 86.2 fl (ref 78.0–100.0)
MONO ABS: 0.4 10*3/uL (ref 0.1–1.0)
Monocytes Relative: 6.1 % (ref 3.0–12.0)
NEUTROS PCT: 60.1 % (ref 43.0–77.0)
Neutro Abs: 4 10*3/uL (ref 1.4–7.7)
PLATELETS: 354 10*3/uL (ref 150.0–400.0)
RBC: 4.9 Mil/uL (ref 3.87–5.11)
RDW: 13 % (ref 11.5–15.5)
WBC: 6.6 10*3/uL (ref 4.0–10.5)

## 2017-11-03 LAB — BASIC METABOLIC PANEL
BUN: 14 mg/dL (ref 6–23)
CO2: 31 mEq/L (ref 19–32)
CREATININE: 1.05 mg/dL (ref 0.40–1.20)
Calcium: 9.4 mg/dL (ref 8.4–10.5)
Chloride: 101 mEq/L (ref 96–112)
GFR: 61.66 mL/min (ref >60.00)
Glucose, Bld: 89 mg/dL (ref 70–99)
Potassium: 4.4 mEq/L (ref 3.5–5.1)
Sodium: 139 mEq/L (ref 135–145)

## 2017-11-03 LAB — HEPATIC FUNCTION PANEL
ALK PHOS: 60 U/L (ref 39–117)
ALT: 12 U/L (ref 0–35)
AST: 16 U/L (ref 0–37)
Albumin: 4 g/dL (ref 3.5–5.2)
BILIRUBIN DIRECT: 0 mg/dL (ref 0.0–0.3)
BILIRUBIN TOTAL: 0.3 mg/dL (ref 0.2–1.2)
Total Protein: 7.1 g/dL (ref 6.0–8.3)

## 2017-11-03 LAB — LIPID PANEL
CHOL/HDL RATIO: 4
Cholesterol: 266 mg/dL — ABNORMAL HIGH (ref 0–200)
HDL: 68.6 mg/dL (ref >39.00)
LDL CALC: 160 mg/dL — AB (ref 0–99)
NonHDL: 197.3
TRIGLYCERIDES: 189 mg/dL — AB (ref 0.0–149.0)
VLDL: 37.8 mg/dL (ref 0.0–40.0)

## 2017-11-03 LAB — HEMOGLOBIN A1C: Hgb A1c MFr Bld: 6 % (ref 4.6–6.5)

## 2017-11-03 LAB — TSH: TSH: 2.37 u[IU]/mL (ref 0.35–4.50)

## 2017-11-03 MED ORDER — ZOLPIDEM TARTRATE 5 MG PO TABS: ORAL_TABLET | ORAL | 0 refills | Status: DC

## 2017-11-03 NOTE — Patient Instructions (Signed)
Will notify you  of labs when available.  If all ok then ROV 6 months and recheck a1c sugar control or as needed    Preventing Type 2 Diabetes Mellitus Type 2 diabetes (type 2 diabetes mellitus) is a long-term (chronic) disease that affects blood sugar (glucose) levels. Normally, a hormone called insulin allows glucose to enter cells in the body. The cells use glucose for energy. In type 2 diabetes, one or both of these problems may be present:  The body does not make enough insulin.  The body does not respond properly to insulin that it makes (insulin resistance).  Insulin resistance or lack of insulin causes excess glucose to build up in the blood instead of going into cells. As a result, high blood glucose (hyperglycemia) develops, which can cause many complications. Being overweight or obese and having an inactive (sedentary) lifestyle can increase your risk for diabetes. Type 2 diabetes can be delayed or prevented by making certain nutrition and lifestyle changes. What nutrition changes can be made?  Eat healthy meals and snacks regularly. Keep a healthy snack with you for when you get hungry between meals, such as fruit or a handful of nuts.  Eat lean meats and proteins that are low in saturated fats, such as chicken, fish, egg whites, and beans. Avoid processed meats.  Eat plenty of fruits and vegetables and plenty of grains that have not been processed (whole grains). It is recommended that you eat: ? 1?2 cups of fruit every day. ? 2?3 cups of vegetables every day. ? 6?8 oz of whole grains every day, such as oats, whole wheat, bulgur, brown rice, quinoa, and millet.  Eat low-fat dairy products, such as milk, yogurt, and cheese.  Eat foods that contain healthy fats, such as nuts, avocado, olive oil, and canola oil.  Drink water throughout the day. Avoid drinks that contain added sugar, such as soda or sweet tea.  Follow instructions from your health care provider about  specific eating or drinking restrictions.  Control how much food you eat at a time (portion size). ? Check food labels to find out the serving sizes of foods. ? Use a kitchen scale to weigh amounts of foods.  Saute or steam food instead of frying it. Cook with water or broth instead of oils or butter.  Limit your intake of: ? Salt (sodium). Have no more than 1 tsp (2,400 mg) of sodium a day. If you have heart disease or high blood pressure, have less than ? tsp (1,500 mg) of sodium a day. ? Saturated fat. This is fat that is solid at room temperature, such as butter or fat on meat. What lifestyle changes can be made?  Activity  Do moderate-intensity physical activity for at least 30 minutes on at least 5 days of the week, or as much as told by your health care provider.  Ask your health care provider what activities are safe for you. A mix of physical activities may be best, such as walking, swimming, cycling, and strength training.  Try to add physical activity into your day. For example: ? Park in spots that are farther away than usual, so that you walk more. For example, park in a far corner of the parking lot when you go to the office or the grocery store. ? Take a walk during your lunch break. ? Use stairs instead of elevators or escalators. Weight Loss  Lose weight as directed. Your health care provider can determine how much weight loss is  best for you and can help you lose weight safely.  If you are overweight or obese, you may be instructed to lose at least 5?7 % of your body weight. Alcohol and Tobacco   Limit alcohol intake to no more than 1 drink a day for nonpregnant women and 2 drinks a day for men. One drink equals 12 oz of beer, 5 oz of wine, or 1 oz of hard liquor.  Do not use any tobacco products, such as cigarettes, chewing tobacco, and e-cigarettes. If you need help quitting, ask your health care provider. Work With Your Health Care Provider  Have your blood  glucose tested regularly, as told by your health care provider.  Discuss your risk factors and how you can reduce your risk for diabetes.  Get screening tests as told by your health care provider. You may have screening tests regularly, especially if you have certain risk factors for type 2 diabetes.  Make an appointment with a diet and nutrition specialist (registered dietitian). A registered dietitian can help you make a healthy eating plan and can help you understand portion sizes and food labels. Why are these changes important?  It is possible to prevent or delay type 2 diabetes and related health problems by making lifestyle and nutrition changes.  It can be difficult to recognize signs of type 2 diabetes. The best way to avoid possible damage to your body is to take actions to prevent the disease before you develop symptoms. What can happen if changes are not made?  Your blood glucose levels may keep increasing. Having high blood glucose for a long time is dangerous. Too much glucose in your blood can damage your blood vessels, heart, kidneys, nerves, and eyes.  You may develop prediabetes or type 2 diabetes. Type 2 diabetes can lead to many chronic health problems and complications, such as: ? Heart disease. ? Stroke. ? Blindness. ? Kidney disease. ? Depression. ? Poor circulation in the feet and legs, which could lead to surgical removal (amputation) in severe cases. Where to find support:  Ask your health care provider to recommend a registered dietitian, diabetes educator, or weight loss program.  Look for local or online weight loss groups.  Join a gym, fitness club, or outdoor activity group, such as a walking club. Where to find more information: To learn more about diabetes and diabetes prevention, visit:  American Diabetes Association (ADA): www.diabetes.[employer]  National Institute of Diabetes and Digestive and Kidney Diseases:  Toy[employer].cawww.niddk.nih.gov/health-information/diabetes  To learn more about healthy eating, visit:  The U.S. Department of Agriculture Architect(USDA), Choose My Plate: http://yates.biz/www.choosemyplate.gov/food-groups  Office of Disease Prevention and Health Promotion (ODPHP), Dietary Guidelines: [employer]ingMag[employer].siwww.health.gov/dietaryguidelines  Summary  You can reduce your risk for type 2 diabetes by increasing your physical activity, eating healthy foods, and losing weight as directed.  Talk with your health care provider about your risk for type 2 diabetes. Ask about any blood tests or screening tests that you need to have. This information is not intended to replace advice given to you by your health care provider. Make sure you discuss any questions you have with your health care provider. Document Released: 01/25/2016 Document Revised: 03/10/2016 Document Reviewed: 11/24/2015 Elsevier Interactive Patient Education  [employer]2018 Elsevier Inc.

## 2017-11-10 ENCOUNTER — Other Ambulatory Visit: Payer: Self-pay | Admitting: Internal Medicine

## 2017-12-21 DIAGNOSIS — H5213 Myopia, bilateral: Secondary | ICD-10-CM | POA: Diagnosis not present

## 2017-12-24 ENCOUNTER — Other Ambulatory Visit: Payer: Self-pay | Admitting: Internal Medicine

## 2017-12-31 ENCOUNTER — Other Ambulatory Visit: Payer: Self-pay | Admitting: Internal Medicine

## 2018-01-03 ENCOUNTER — Other Ambulatory Visit: Payer: Self-pay | Admitting: Internal Medicine

## 2018-01-03 NOTE — Telephone Encounter (Signed)
Pt is requesting for refill on her Sumatriptan, last filled on 10/19/2017 for 27 tablets, Pt last OV was 11/03/2017. Please Advise if ok to refill.

## 2018-02-21 ENCOUNTER — Ambulatory Visit: Payer: Self-pay

## 2018-02-21 NOTE — Telephone Encounter (Signed)
Patient called in and says "my son was diagnosed with strep last night in the emergency room and was started on antibiotics after the throat swab. Today, my throat is scratchy, but not sore, no fever. I was wondering do I need to come in for a throat swab for strep or just wait it out?" I asked about viral symptoms, she says "I've been having a runny nose all spring, but no other symptoms." According to protocol, home care advice given, patient verbalized understanding.   Reason for Disposition . [1] Strep throat EXPOSURE (i.e., meets definition) AND [2] no sore throat  Answer Assessment - Initial Assessment Questions 1. STREP EXPOSURE: "Was the exposure to someone who lives within your home?" If not, ask: "How much contact did you have with the sick individual?"      Yes, my child 2. ONSET: "How many days ago did the contact occur?"      Was diagnosed last night by the ED physician; swabbed positive 3. PROVEN STREP: "Are you sure the person with strep had a positive throat culture or rapid strep test?"     Rapid strep test 4. STREP SYMPTOMS: "Do YOU have a sore throat, fever, or other symptoms suggestive of strep?"      Scratchy throat 5. VIRAL SYMPTOMS: "Are there any symptoms of a cold, such as a runny nose, cough, hoarse voice?"     Runny nose-has been going on 6. PREGNANCY: "Is there any chance you are pregnant?" "When was your last menstrual period?"     No  Protocols used: STREP THROAT EXPOSURE-A-AH

## 2018-02-22 ENCOUNTER — Encounter: Payer: Self-pay | Admitting: Internal Medicine

## 2018-02-22 ENCOUNTER — Ambulatory Visit: Payer: BLUE CROSS/BLUE SHIELD | Admitting: Internal Medicine

## 2018-02-22 ENCOUNTER — Other Ambulatory Visit: Payer: Self-pay | Admitting: Internal Medicine

## 2018-02-22 DIAGNOSIS — J029 Acute pharyngitis, unspecified: Secondary | ICD-10-CM

## 2018-02-22 DIAGNOSIS — J020 Streptococcal pharyngitis: Secondary | ICD-10-CM

## 2018-02-22 DIAGNOSIS — Z20818 Contact with and (suspected) exposure to other bacterial communicable diseases: Secondary | ICD-10-CM

## 2018-02-22 LAB — POCT RAPID STREP A (OFFICE): RAPID STREP A SCREEN: POSITIVE — AB

## 2018-02-22 MED ORDER — AMOXICILLIN 500 MG PO CAPS: 500.0000 mg | ORAL_CAPSULE | Freq: Two times a day (BID) | ORAL | 0 refills | Status: DC

## 2018-02-22 NOTE — Progress Notes (Signed)
No chief complaint on file.   HPI: Sandra Hudson 42 y.o.   sda  Here with daughter   Who has eval for strep  And  Other daughete had pos   Tests and rx  For such   Seen as a work in  Same room  She has st this am with glands tender and no  Fever  No cough  ROS: See pertinent positives and negatives per HPI.  Past Medical History:  Diagnosis Date  . Allergic rhinitis due to dust   . Allergy   . Breech presentation 01/29/2014  . Female infertility   . Headache(784.0)   . Pre-eclampsia 01/29/2014  . Pregnancy induced hypertension   . Status post primary low transverse cesarean section 02/01/2014    Family History  Problem Relation Age of Onset  . Depression Father   . Depression Mother   . Breast cancer Unknown        maternal great aunt  . Breast cancer Paternal Grandmother   . ALS Father        multiple system atropy    Social History   Socioeconomic History  . Marital status: Married    Spouse name: Not on file  . Number of children: 1  . Years of education: Not on file  . Highest education level: Not on file  Occupational History    Employer: UNEMPLOYED  Social Needs  . Financial resource strain: Not on file  . Food insecurity:    Worry: Not on file    Inability: Not on file  . Transportation needs:    Medical: Not on file    Non-medical: Not on file  Tobacco Use  . Smoking status: Never Smoker  . Smokeless tobacco: Never Used  Substance and Sexual Activity  . Alcohol use: No    Comment: occasionally  . Drug use: No  . Sexual activity: Not on file  Lifestyle  . Physical activity:    Days per week: Not on file    Minutes per session: Not on file  . Stress: Not on file  Relationships  . Social connections:    Talks on phone: Not on file    Gets together: Not on file    Attends religious service: Not on file    Active member of club or organization: Not on file    Attends meetings of clubs or organizations: Not on file    Relationship status: Not on  file  Other Topics Concern  . 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    Outpatient Medications Prior to Visit  Medication Sig Dispense Refill  . amoxicillin (AMOXIL) 500 MG capsule Take 1 capsule (500 mg total) by mouth 2 (two) times daily. If needed for strep throat 20 capsule 0  . Biotin (BIOTIN 5000) 5 MG CAPS Take 1 capsule by mouth daily.    Marland Kitchen buPROPion (WELLBUTRIN XL) 300 MG 24 hr tablet TAKE 1 TABLET BY MOUTH EVERY MORNING 90 tablet 1  . cetirizine (ZYRTEC) 10 MG tablet Take 10 mg by mouth daily.    . Cholecalciferol (VITAMIN D3) 2000 units TABS Take 1 tablet by mouth daily.    . DULoxetine (CYMBALTA) 60 MG capsule take 1 capsule by mouth once daily 90 capsule 1  . FALMINA 0.1-20 MG-MCG  tablet Take 1 tablet by mouth daily.  10  . metFORMIN (GLUCOPHAGE) 850 MG tablet take 1 tablet by mouth once daily WITH BREAKFAST, THEN INCREASE TO TWICE A DAY WITH MEALS AFTER 3 TO 4 WEEKS 60 tablet 3  . OVER THE COUNTER MEDICATION [facility] OTC Sleep Aid    . spironolactone (ALDACTONE) 100 MG tablet Take 100 mg by mouth 2 (two) times daily.    . SUMAtriptan (IMITREX) 100 MG tablet TAKE 1 TABLET BY MOUTH IMMEDIATELY, MAY REPEAT AFTER 2 HOURS 27 tablet 0  . SUMAtriptan (IMITREX) 100 MG tablet TAKE 1 TABLET BY MOUTH IMMEDIATELY MAY REPEAT AFTER 2 HOURS 27 tablet 0  . zinc gluconate 50 MG tablet Take 50 mg by mouth daily.    Marland Kitchen zolpidem (AMBIEN) 5 MG tablet take 1 tablet by mouth at bedtime if needed for sleep AVOID REGULAR USE 30 tablet 0   No facility-administered medications prior to visit.      EXAM:  There were no vitals taken for this visit.  There is no height or weight on file to calculate BMI. Heart rr  Normal respirations GENERAL: vitals reviewed and listed above, alert, oriented, appears well hydrated and in no acute distress  OP : no lesion edema or exudate   Red 1_ no edema or  exuidate  NECK: no obvious masses on inspection tender ac nodes  MS: moves all extremities without noticeable focal  abnormality PSYCH: pleasant and cooperative, no obvious depression or anxiety Skin  No acute rashes  Non toxic   ASSESSMENT AND PLAN:  Discussed the following assessment and plan:  Strep throat - Plan: POCT rapid strep A Rapid strep pos     rx given  To take if positive  -Patient advised to return or notify health care team  if symptoms worsen ,persist or new concerns arise.  There are no Patient Instructions on file for this visit.   Neta Mends. Stephene Alegria M.D.

## 2018-04-09 ENCOUNTER — Other Ambulatory Visit: Payer: Self-pay | Admitting: Internal Medicine

## 2018-04-11 NOTE — Telephone Encounter (Signed)
Sent to the pharmacy by e-scribe.  Pt scheduled for follow up on 05/03/18.  Due for cpx 10/2018.

## 2018-04-12 DIAGNOSIS — L814 Other melanin hyperpigmentation: Secondary | ICD-10-CM | POA: Diagnosis not present

## 2018-04-12 DIAGNOSIS — D225 Melanocytic nevi of trunk: Secondary | ICD-10-CM | POA: Diagnosis not present

## 2018-04-12 DIAGNOSIS — L821 Other seborrheic keratosis: Secondary | ICD-10-CM | POA: Diagnosis not present

## 2018-04-12 DIAGNOSIS — D1800 Hemangioma unspecified site: Secondary | ICD-10-CM | POA: Diagnosis not present

## 2018-05-03 ENCOUNTER — Ambulatory Visit: Payer: BLUE CROSS/BLUE SHIELD | Admitting: Internal Medicine

## 2018-05-08 NOTE — Progress Notes (Signed)
Chief Complaint  Patient presents with  . Follow-up    Pt still having sleep issues - discuss getting a sleep study    HPI: Sandra Hudson 42 y.o. come in for Chronic disease management    Sleep and fatigue:   Recent  Trip to [place]  Hard to wake up.  In am and hard to sleep through the night.   Sleep 10 - 12   And takes  Take otc sleep aidevery night  .... And  Up  2-3 night kids  na awakening other things .  And then husband up in am  Has snoring and was suggested to get evaluated  No ob rls but right leg sx at times that may keep her awake  Taking metformin without side effects no numbness or diabetes sx .   ROS: See pertinent positives and negatives per HPI.  Past Medical History:  Diagnosis Date  . Allergic rhinitis due to dust   . Allergy   . Breech presentation 01/29/2014  . Female infertility   . Headache(784.0)   . Pre-eclampsia 01/29/2014  . Pregnancy induced hypertension   . Status post primary low transverse cesarean section 02/01/2014    Family History  Problem Relation Age of Onset  . Depression Father   . Depression Mother   . Breast cancer Unknown        maternal great aunt  . Breast cancer Paternal Grandmother   . ALS Father        multiple system atropy    Social History   Socioeconomic History  . Marital status: Married    Spouse name: Not on file  . Number of children: 1  . Years of education: Not on file  . Highest education level: Not on file  Occupational History    Employer: UNEMPLOYED  Social Needs  . Financial resource strain: Not on file  . Food insecurity:    Worry: Not on file    Inability: Not on file  . Transportation needs:    Medical: Not on file    Non-medical: Not on file  Tobacco Use  . Smoking status: Never Smoker  . Smokeless tobacco: Never Used  Substance and Sexual Activity  . Alcohol use: No    Comment: occasionally  . Drug use: No  . Sexual activity: Not on file  Lifestyle  . Physical activity:    Days  per week: Not on file    Minutes per session: Not on file  . Stress: Not on file  Relationships  . Social connections:    Talks on phone: Not on file    Gets together: Not on file    Attends religious service: Not on file    Active member of club or organization: Not on file    Attends meetings of clubs or organizations: Not on file    Relationship status: Not on file  Other Topics Concern  . 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    Outpatient Medications Prior to Visit  Medication Sig Dispense Refill  . amoxicillin (AMOXIL) 500 MG capsule Take 1 capsule (500 mg total) by mouth 2 (two) times daily. If needed for strep throat 20 capsule 0  . Biotin (BIOTIN 5000) 5 MG CAPS Take 1 capsule by mouth daily.    Marland Kitchen  buPROPion (WELLBUTRIN XL) 300 MG 24 hr tablet TAKE 1 TABLET BY MOUTH EVERY MORNING 90 tablet 1  . cetirizine (ZYRTEC) 10 MG tablet Take 10 mg by mouth daily.    . Cholecalciferol (VITAMIN D3) 2000 units TABS Take 1 tablet by mouth daily.    . DULoxetine (CYMBALTA) 60 MG capsule TAKE 1 CAPSULE BY MOUTH ONCE DAILY 90 capsule 1  . FALMINA 0.1-20 MG-MCG tablet Take 1 tablet by mouth daily.  10  . metFORMIN (GLUCOPHAGE) 850 MG tablet take 1 tablet by mouth once daily WITH BREAKFAST, THEN INCREASE TO TWICE A DAY WITH MEALS AFTER 3 TO 4 WEEKS 60 tablet 3  . OVER THE COUNTER MEDICATION [facility] OTC Sleep Aid    . spironolactone (ALDACTONE) 100 MG tablet Take 100 mg by mouth 2 (two) times daily.    . SUMAtriptan (IMITREX) 100 MG tablet TAKE 1 TABLET BY MOUTH IMMEDIATELY MAY REPEAT AFTER 2 HOURS 27 tablet 0  . zinc gluconate 50 MG tablet Take 50 mg by mouth daily.    Marland Kitchen. zolpidem (AMBIEN) 5 MG tablet take 1 tablet by mouth at bedtime if needed for sleep AVOID REGULAR USE 30 tablet 0  . SUMAtriptan (IMITREX) 100 MG tablet TAKE 1 TABLET BY MOUTH IMMEDIATELY, MAY REPEAT AFTER 2 HOURS  (Patient not taking: Reported on 05/09/2018) 27 tablet 0   No facility-administered medications prior to visit.      EXAM:  BP 116/78 (BP Location: Right Arm, Patient Position: Sitting, Cuff Size: Normal)   Pulse (!) 108   Temp 98.1 F (36.7 C) (Oral)   Wt 202 lb 6.4 oz (91.8 kg)   BMI 31.47 kg/m   Body mass index is 31.47 kg/m.  GENERAL: vitals reviewed and listed above, alert, oriented, appears well hydrated and in no acute distress  Looking tired  HEENT: atraumatic, conjunctiva  clear, no obvious abnormalities on inspection of external nose and ears  NECK: no obvious masses on inspection palpation  LUNGS: clear to auscultation bilaterally, no wheezes, rales or rhonchi, good air movement CV: HRRR, no clubbing cyanosis or  peripheral edema nl cap refill  MS: moves all extremities without noticeable focal  abnormality PSYCH: pleasant and cooperative, no obvious depression or anxiety Lab Results  Component Value Date   WBC 6.6 11/03/2017   HGB 14.0 11/03/2017   HCT 42.2 11/03/2017   PLT 354.0 11/03/2017   GLUCOSE 89 11/03/2017   CHOL 266 (H) 11/03/2017   TRIG 189.0 (H) 11/03/2017   HDL 68.60 11/03/2017   LDLDIRECT 158.0 11/06/2012   LDLCALC 160 (H) 11/03/2017   ALT 12 11/03/2017   AST 16 11/03/2017   NA 139 11/03/2017   K 4.4 11/03/2017   CL 101 11/03/2017   CREATININE 1.05 11/03/2017   BUN 14 11/03/2017   CO2 31 11/03/2017   TSH 2.37 11/03/2017   HGBA1C 5.7 (A) 05/09/2018   MICROALBUR 1.1 09/15/2010   BP Readings from Last 3 Encounters:  05/09/18 116/78  07/07/17 118/88  03/06/17 102/90    ASSESSMENT AND PLAN:  Discussed the following assessment and plan:  Hyperglycemia - improved  - Plan: POC HgB A1c  Insomnia, unspecified type - Plan: Ambulatory referral to Pulmonology  Disordered sleep - Plan: Ambulatory referral to Pulmonology  Snoring - Plan: Ambulatory referral to Pulmonology  Medication management - Plan: POC HgB A1c .     Many causes prob   Adding to fatigue   Refer for sleep consult . Reviewed sleep hygiene .  a1c is better  Continue  Metformin will address lipids at next check   -Patient advised to return or notify health care team  if  new concerns arise.  Patient Instructions   You sleep is fragmented  .  Ok to  Do a referral to evaluation to R/O  Other causes that can help .  No caffeine after  2-3 pm .    Limit sleep aids as possible as may be  Adding to the fatigue  In am  But not the  Main cause  of the problem .     Wt Readings from Last 3 Encounters:  05/09/18 202 lb 6.4 oz (91.8 kg)  07/07/17 199 lb 3.2 oz (90.4 kg)  03/06/17 201 lb 12.8 oz (91.5 kg)                  Memory Heinrichs K. Effrey Davidow M.D.

## 2018-05-09 ENCOUNTER — Ambulatory Visit: Payer: BLUE CROSS/BLUE SHIELD | Admitting: Internal Medicine

## 2018-05-09 ENCOUNTER — Encounter: Payer: Self-pay | Admitting: Internal Medicine

## 2018-05-09 VITALS — BP 116/78 | HR 108 | Temp 98.1°F | Wt 202.4 lb

## 2018-05-09 DIAGNOSIS — R739 Hyperglycemia, unspecified: Secondary | ICD-10-CM | POA: Diagnosis not present

## 2018-05-09 DIAGNOSIS — G479 Sleep disorder, unspecified: Secondary | ICD-10-CM | POA: Diagnosis not present

## 2018-05-09 DIAGNOSIS — G4700 Insomnia, unspecified: Secondary | ICD-10-CM | POA: Diagnosis not present

## 2018-05-09 DIAGNOSIS — Z79899 Other long term (current) drug therapy: Secondary | ICD-10-CM

## 2018-05-09 DIAGNOSIS — R0683 Snoring: Secondary | ICD-10-CM

## 2018-05-09 LAB — POCT GLYCOSYLATED HEMOGLOBIN (HGB A1C): Hemoglobin A1C: 5.7 % — AB (ref 4.0–5.6)

## 2018-05-09 NOTE — Patient Instructions (Addendum)
You sleep is fragmented  .  Ok to  Do a referral to evaluation to R/O  Other causes that can help .  No caffeine after  2-3 pm .    Limit sleep aids as possible as may be  Adding to the fatigue  In am  But not the  Main cause  of the problem .     Wt Readings from Last 3 Encounters:  05/09/18 202 lb 6.4 oz (91.8 kg)  07/07/17 199 lb 3.2 oz (90.4 kg)  03/06/17 201 lb 12.8 oz (91.5 kg)

## 2018-05-14 ENCOUNTER — Other Ambulatory Visit: Payer: Self-pay | Admitting: Internal Medicine

## 2018-05-19 ENCOUNTER — Other Ambulatory Visit: Payer: Self-pay | Admitting: Internal Medicine

## 2018-06-17 ENCOUNTER — Other Ambulatory Visit: Payer: Self-pay | Admitting: Internal Medicine

## 2018-06-21 DIAGNOSIS — L989 Disorder of the skin and subcutaneous tissue, unspecified: Secondary | ICD-10-CM | POA: Diagnosis not present

## 2018-06-21 DIAGNOSIS — D2371 Other benign neoplasm of skin of right lower limb, including hip: Secondary | ICD-10-CM | POA: Diagnosis not present

## 2018-06-29 ENCOUNTER — Ambulatory Visit (INDEPENDENT_AMBULATORY_CARE_PROVIDER_SITE_OTHER): Payer: BLUE CROSS/BLUE SHIELD | Admitting: Pulmonary Disease

## 2018-06-29 ENCOUNTER — Encounter: Payer: Self-pay | Admitting: Pulmonary Disease

## 2018-06-29 VITALS — BP 128/72 | Ht 67.25 in

## 2018-06-29 DIAGNOSIS — R400 Somnolence: Secondary | ICD-10-CM

## 2018-06-29 NOTE — Progress Notes (Signed)
Sandra EatonSara R Hudson    [id number]    10/27/1975  Primary Care Physician:Panosh, Neta [name], MD  Referring Physician: Madelin HeadingsPanosh, [name], MD [address] [place], [place] [id number]  Chief complaint:   Patient with daytime sleepiness Sleep onset and sleep maintenance insomnia  HPI: Has had difficulty sleeping since school age Difficulty falling asleep, difficulty maintaining sleep Has tried multiple sleep aids in the past, has used [place]unesta, Ambien Currently uses an over-the-counter sleep aid Usually goes to bed about 9-11 PM, may take about an hour to fall asleep-this has not changed recently Wakes up about 2-3 times during the night Finally gets out of bed at about 8 to 8:30 in the morning, spouse wakes up about 530-so she is in and out of sleep between 530 and 830 Has significant sleep inertia She does have some daytime sleepiness but does not take naps No significant night sweats She moves around the bed during sleep but does not have any symptoms suggesting restless legs History of nasal surgery for deviated septum  History of depression  Her youngest child sleeps in bed with her at present-she does not think this influences her sleep any significantly  Non-smoker  Outpatient Encounter Medications as of 06/29/2018  Medication Sig  . Biotin (BIOTIN 5000) 5 MG CAPS Take 1 capsule by mouth daily.  Marland Kitchen. buPROPion (WELLBUTRIN XL) 300 MG 24 hr tablet TAKE 1 TABLET BY MOUTH EVERY MORNING  . cetirizine (ZYRTEC) 10 MG tablet Take 10 mg by mouth daily.  . Cholecalciferol (VITAMIN D3) 2000 units TABS Take 1 tablet by mouth daily.  . DULoxetine (CYMBALTA) 60 MG capsule TAKE 1 CAPSULE BY MOUTH ONCE DAILY  . FALMINA 0.1-20 MG-MCG tablet Take 1 tablet by mouth daily.  . metFORMIN (GLUCOPHAGE) 850 MG tablet TAKE 1 TABLET BY MOUTH ONCE DAILY WITH BREAKFAST FOR 3-4 WEEKS THEN INCREASE TO 1 TABLET TWICE A DAY WITH MEALS  . OVER THE COUNTER MEDICATION [facility] OTC Sleep Aid  .  spironolactone (ALDACTONE) 100 MG tablet Take 100 mg by mouth 2 (two) times daily.  . SUMAtriptan (IMITREX) 100 MG tablet TAKE 1 TABLET BY MOUTH IMMEDIATELY MAY REPEAT AFTER 2 HOURS  . zinc gluconate 50 MG tablet Take 50 mg by mouth daily.  Marland Kitchen. zolpidem (AMBIEN) 5 MG tablet take 1 tablet by mouth at bedtime if needed for sleep AVOID REGULAR USE  . [DISCONTINUED] amoxicillin (AMOXIL) 500 MG capsule Take 1 capsule (500 mg total) by mouth 2 (two) times daily. If needed for strep throat   No facility-administered encounter medications on file as of 06/29/2018.     Allergies as of 06/29/2018  . (No Known Allergies)    Past Medical History:  Diagnosis Date  . Allergic rhinitis due to dust   . Allergy   . Breech presentation 01/29/2014  . Female infertility   . Headache(784.0)   . Pre-eclampsia 01/29/2014  . Pregnancy induced hypertension   . Status post primary low transverse cesarean section 02/01/2014    Past Surgical History:  Procedure Laterality Date  . CESAREAN SECTION N/A 01/31/2014   Procedure: CESAREAN SECTION;  Surgeon: Purcell NailsAngela Y Roberts, MD;  Location: [facility];  Service: Obstetrics;  Laterality: N/A;  . NASAL SEPTUM SURGERY     deviated septum and large turbinate    Family History  Problem Relation Age of Onset  . Depression Father   . Depression Mother   . Breast cancer Unknown  maternal great aunt  . Breast cancer Paternal Grandmother   . ALS Father        multiple system atropy    Social History   Socioeconomic History  . Marital status: Married    Spouse name: Not on file  . Number of children: 1  . Years of education: Not on file  . Highest education level: Not on file  Occupational History    Employer: UNEMPLOYED  Social Needs  . Financial resource strain: Not on file  . Food insecurity:    Worry: Not on file    Inability: Not on file  . Transportation needs:    Medical: Not on file    Non-medical: Not on file  Tobacco Use  . Smoking status:  Never Smoker  . Smokeless tobacco: Never Used  Substance and Sexual Activity  . Alcohol use: No    Comment: occasionally  . Drug use: No  . Sexual activity: Not on file  Lifestyle  . Physical activity:    Days per week: Not on file    Minutes per session: Not on file  . Stress: Not on file  Relationships  . Social connections:    Talks on phone: Not on file    Gets together: Not on file    Attends religious service: Not on file    Active member of club or organization: Not on file    Attends meetings of clubs or organizations: Not on file    Relationship status: Not on file  . Intimate partner violence:    Fear of current or ex partner: Not on file    Emotionally abused: Not on file    Physically abused: Not on file    Forced sexual activity: Not on file  Other Topics Concern  . 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    Review of Systems  Constitutional: Positive for fatigue.  Respiratory: Positive for apnea.   Psychiatric/Behavioral: Positive for sleep disturbance.  All other systems reviewed and are negative.   There were no vitals filed for this visit.   Physical Exam  Constitutional: She is oriented to person, place, and time. She appears well-developed and well-nourished.  HENT:  Head: Normocephalic and atraumatic.  Mallampati 2  Eyes: Pupils are equal, round, and reactive to light. Conjunctivae and EOM are normal. Right eye exhibits no discharge.  Neck: Normal range of motion. Neck supple. No tracheal deviation present. No thyromegaly present.  Cardiovascular: Normal rate and regular rhythm.  Pulmonary/Chest: Effort normal and breath sounds normal.  Abdominal: Soft. Bowel sounds are normal. She exhibits no distension.  Musculoskeletal: She exhibits no edema.  Neurological: She is alert and oriented to person, place, and time. No cranial nerve deficit.    Epworth Sleepiness Scale of 6  Assessment:  Moderate probability of sleep disordered breathing  Nonrestorative sleep  Insomnia  Mood Disorder   Plan/Recommendations:  We will schedule the patient for an in lab polysomnogram  Multiple factors likely contributing to insomnia, nonrestorative sleep  She is not actively using the over-the-counter sleep aid at present-intermittent use  Movement disorder may be contributing  Ruling out significant sleep disordered breathing which may be easily treated is important  Pathophysiology of sleep disordered breathing discussed  She has had symptoms for many years, multiple factors  may be playing a role     Virl Diamond MD [facility] 06/29/2018, 2:38 PM  CC: Panosh, Neta Mends, MD

## 2018-06-29 NOTE — Patient Instructions (Signed)
Moderate probability of significant sleep disordered breathing  Nonrestorative sleep  Possible sleep-related movement disorder   We will schedule an in lab polysomnogram Multiple factors may be contributing to your nonrestorative sleep including movement disorder  I will see you back in the office in about 8 weeks following initiation of treatment if needed  We will follow-up on study findings with you as they become available

## 2018-07-09 ENCOUNTER — Other Ambulatory Visit: Payer: Self-pay | Admitting: Pulmonary Disease

## 2018-07-09 DIAGNOSIS — G4700 Insomnia, unspecified: Secondary | ICD-10-CM

## 2018-07-22 ENCOUNTER — Other Ambulatory Visit: Payer: Self-pay | Admitting: Internal Medicine

## 2018-07-31 DIAGNOSIS — G4710 Hypersomnia, unspecified: Secondary | ICD-10-CM | POA: Diagnosis not present

## 2018-08-01 DIAGNOSIS — G4710 Hypersomnia, unspecified: Secondary | ICD-10-CM | POA: Diagnosis not present

## 2018-08-02 ENCOUNTER — Other Ambulatory Visit: Payer: Self-pay | Admitting: *Deleted

## 2018-08-02 ENCOUNTER — Telehealth: Payer: Self-pay | Admitting: Pulmonary Disease

## 2018-08-02 DIAGNOSIS — G4700 Insomnia, unspecified: Secondary | ICD-10-CM

## 2018-08-02 NOTE — Telephone Encounter (Signed)
Dr. Wynona Neat has reviewed the home sleep test this test was negative for sleep apnea.  Dr . Wynona Neat recommendation are further evaluation for contributors to patients insomnia. Optimize  Treatment for depression , sedating anit-dressants mey be of benefit.Addition of trazodone may help.Closes clinical follow up. Weight loss is encouraged. Caution against driving while sleepy.   Patient advised and made ov.

## 2018-08-11 ENCOUNTER — Other Ambulatory Visit: Payer: Self-pay | Admitting: Internal Medicine

## 2018-08-14 ENCOUNTER — Ambulatory Visit: Payer: BLUE CROSS/BLUE SHIELD | Admitting: Pulmonary Disease

## 2018-08-14 ENCOUNTER — Encounter: Payer: Self-pay | Admitting: Pulmonary Disease

## 2018-08-14 VITALS — BP 118/78 | HR 68 | Ht 67.25 in | Wt 202.0 lb

## 2018-08-14 DIAGNOSIS — G4701 Insomnia due to medical condition: Secondary | ICD-10-CM

## 2018-08-14 DIAGNOSIS — G4700 Insomnia, unspecified: Secondary | ICD-10-CM

## 2018-08-14 MED ORDER — ESZOPICLONE 2 MG PO TABS: 2.0000 mg | ORAL_TABLET | Freq: Every evening | ORAL | 2 refills | Status: DC | PRN

## 2018-08-14 NOTE — Progress Notes (Signed)
Sandra Hudson    [id number]    1976/07/10  Primary Care Physician:Panosh, Neta Mends, MD  Referring Physician: [name], MD [address] [place], [place] [address]  Chief complaint:   Patient with daytime sleepiness Sleep onset and sleep maintenance insomnia Recent sleep study negative for significant sleep disordered breathing, did support insomnia  HPI: Has had difficulty sleeping since school age Difficulty falling asleep, difficulty maintaining sleep Has tried multiple sleep aids in the past, has used [place], Ambien Lunesta did work well in the past Ambien is not helping at all at present  Usually goes to bed about 9-11 PM, may take about an hour to fall asleep-this has not changed recently Wakes up about 2-3 times during the night Finally gets out of bed at about 8 to 8:30 in the morning, spouse wakes up about 530-so she is in and out of sleep between 530 and 830 Has significant sleep inertia She does have some daytime sleepiness but does not take naps No significant night sweats She moves around the bed during sleep but does not have any symptoms suggesting restless legs History of nasal surgery for deviated septum  History of depression  Her youngest child sleeps in bed with her at present-she does not think this influences her sleep any significantly  Non-smoker  Outpatient Encounter Medications as of 08/14/2018  Medication Sig  . Biotin (BIOTIN 5000) 5 MG CAPS Take 1 capsule by mouth daily.  Marland Kitchen buPROPion (WELLBUTRIN XL) 300 MG 24 hr tablet TAKE 1 TABLET BY MOUTH EVERY MORNING  . cetirizine (ZYRTEC) 10 MG tablet Take 10 mg by mouth daily.  . Cholecalciferol (VITAMIN D3) 2000 units TABS Take 1 tablet by mouth daily.  . DULoxetine (CYMBALTA) 60 MG capsule TAKE 1 CAPSULE BY MOUTH ONCE DAILY  . FALMINA 0.1-20 MG-MCG tablet Take 1 tablet by mouth daily.  . metFORMIN (GLUCOPHAGE) 850 MG tablet TAKE 1 TABLET BY MOUTH TWICE DAILY  . OVER THE COUNTER  MEDICATION [facility] OTC Sleep Aid  . spironolactone (ALDACTONE) 100 MG tablet Take 100 mg by mouth 2 (two) times daily.  . SUMAtriptan (IMITREX) 100 MG tablet TAKE 1 TABLET BY MOUTH IMMEDIATELY MAY REPEAT AFTER 2 HOURS  . zinc gluconate 50 MG tablet Take 50 mg by mouth daily.  Marland Kitchen zolpidem (AMBIEN) 5 MG tablet take 1 tablet by mouth at bedtime if needed for sleep AVOID REGULAR USE   No facility-administered encounter medications on file as of 08/14/2018.     Allergies as of 08/14/2018  . (No Known Allergies)    Past Medical History:  Diagnosis Date  . Allergic rhinitis due to dust   . Allergy   . Breech presentation 01/29/2014  . Female infertility   . Headache(784.0)   . Pre-eclampsia 01/29/2014  . Pregnancy induced hypertension   . Status post primary low transverse cesarean section 02/01/2014    Past Surgical History:  Procedure Laterality Date  . CESAREAN SECTION N/A 01/31/2014   Procedure: CESAREAN SECTION;  Surgeon: Purcell Nails, MD;  Location: [facility];  Service: Obstetrics;  Laterality: N/A;  . NASAL SEPTUM SURGERY     deviated septum and large turbinate    Family History  Problem Relation Age of Onset  . Depression Father   . Depression Mother   . Breast cancer Unknown        maternal great aunt  . Breast cancer Paternal Grandmother   . ALS Father  multiple system atropy    Social History   Socioeconomic History  . Marital status: Married    Spouse name: Not on file  . Number of children: 1  . Years of education: Not on file  . Highest education level: Not on file  Occupational History    Employer: UNEMPLOYED  Social Needs  . Financial resource strain: Not on file  . Food insecurity:    Worry: Not on file    Inability: Not on file  . Transportation needs:    Medical: Not on file    Non-medical: Not on file  Tobacco Use  . Smoking status: Never Smoker  . Smokeless tobacco: Never Used  Substance and Sexual Activity  . Alcohol use: No     Comment: occasionally  . Drug use: No  . Sexual activity: Not on file  Lifestyle  . Physical activity:    Days per week: Not on file    Minutes per session: Not on file  . Stress: Not on file  Relationships  . Social connections:    Talks on phone: Not on file    Gets together: Not on file    Attends religious service: Not on file    Active member of club or organization: Not on file    Attends meetings of clubs or organizations: Not on file    Relationship status: Not on file  . Intimate partner violence:    Fear of current or ex partner: Not on file    Emotionally abused: Not on file    Physically abused: Not on file    Forced sexual activity: Not on file  Other Topics Concern  . 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    Review of Systems  Constitutional: Positive for fatigue.  Psychiatric/Behavioral: Positive for sleep disturbance.  All other systems reviewed and are negative.   Vitals:   08/14/18 1545  BP: 118/78  Pulse: 68  SpO2: 99%     Physical Exam  Constitutional: She appears well-developed and well-nourished.  HENT:  Head: Normocephalic and atraumatic.  Mallampati 2  Eyes: Pupils are equal, round, and reactive to light. Conjunctivae and EOM are normal. Right eye exhibits no discharge.  Neck: Normal range of motion. Neck supple. No tracheal deviation present. No thyromegaly present.  Cardiovascular: Normal rate and regular rhythm.  Pulmonary/Chest: Effort normal and breath sounds normal. No respiratory distress. She has no wheezes.   Epworth Sleepiness Scale of 6   Assessment:   Sleep onset and sleep maintenance insomnia -Recent sleep study did rule out significant sleep disordered breathing, did support a diagnosis of insomnia  Nonrestorative sleep  Insomnia  Mood Disorder  She is failing Ambien at the present time-Ambien does not help as  sleep She did tolerate lunesta in the past.   Plan  Multiple factors likely contributing to insomnia, nonrestorative sleep  She is not actively using the over-the-counter sleep aid at present-intermittent use  Movement disorder may be contributing  She has had symptoms for many years, multiple factors may be playing a role  We will reinitiate Lunesta as this helped in the past, she is failing Ambien at the present time as it does not seem to be helping  Optimization of medications for depression-may also help her insomnia This may include initiation  of other sedating antidepressants example Zoloft, Prozac, Celexa Trazodone may be added for the treatment of depression   Virl Diamond MD [facility] 08/14/2018, 3:56 PM  CC: Panosh, Neta Mends, MD

## 2018-08-14 NOTE — Patient Instructions (Addendum)
History of chronic insomnia  Recent sleep study negative for significant sleep disordered breathing, did support a diagnosis of insomnia  Patient did well with Lunesta in the past Lunesta 2 mg p.o. nightly  Failing Ambien at the present time-is not working  Will reinitiate Lunesta for chronic insomnia  I will see you back in the office in about 3 months

## 2018-08-27 ENCOUNTER — Other Ambulatory Visit: Payer: Self-pay | Admitting: Internal Medicine

## 2018-08-27 DIAGNOSIS — Z1231 Encounter for screening mammogram for malignant neoplasm of breast: Secondary | ICD-10-CM | POA: Diagnosis not present

## 2018-08-27 DIAGNOSIS — Z01419 Encounter for gynecological examination (general) (routine) without abnormal findings: Secondary | ICD-10-CM | POA: Diagnosis not present

## 2018-08-27 DIAGNOSIS — Z124 Encounter for screening for malignant neoplasm of cervix: Secondary | ICD-10-CM | POA: Diagnosis not present

## 2018-08-27 DIAGNOSIS — Z6831 Body mass index (BMI) 31.0-31.9, adult: Secondary | ICD-10-CM | POA: Diagnosis not present

## 2018-09-10 NOTE — Progress Notes (Signed)
Chief Complaint  Patient presents with  . Medication Management    Cymbalta and Wellbutrin - dosage adjustment or change - pt not feeling well, does not think medication is working adequately. Decreased concentration and emotional     HPI: Sandra Hudson 42 y.o. come in for difficulty with  Mood and  Sadness   Metformin ok   Feeling off for  weeks  Not right and over weekend and feels tearful   At times  .  Had crying episode  With kid stress.  Not suicidal   Saw pulm and  See below about sleep  On Cymbalta and Wellbutrin  Using lunesta  And alternating with otc sleep aids  Multiple factors likely contributing to insomnia, nonrestorative sleep She is not actively using the over-the-counter sleep aid at present-intermittent use Movement disorder may be contributing She has had symptoms for many years, multiple factors may be playing a role We will reinitiate Lunesta as this helped in the past, she is failing Ambien at the present time as it does not seem to be helping Optimization of medications for depression-may also help her insomnia This may include initiation of other sedating antidepressants example Zoloft, Prozac, Celexa Trazodone may be added for the treatment of depression  Virl DiamondAdewale Olalere MD  [facility] 08/14/2018, 3:56 PM ROS: See pertinent positives and negatives per HPI.  Past Medical History:  Diagnosis Date  . Allergic rhinitis due to dust   . Allergy   . Breech presentation 01/29/2014  . Female infertility   . Headache(784.0)   . Pre-eclampsia 01/29/2014  . Pregnancy induced hypertension   . Status post primary low transverse cesarean section 02/01/2014    Family History  Problem Relation Age of Onset  . Depression Father   . Depression Mother   . Breast cancer Unknown        maternal great aunt  . Breast cancer Paternal Grandmother   . ALS Father        multiple system atropy    Social History   Socioeconomic History    . Marital status: Married    Spouse name: Not on file  . Number of children: 1  . Years of education: Not on file  . Highest education level: Not on file  Occupational History    Employer: UNEMPLOYED  Social Needs  . Financial resource strain: Not on file  . Food insecurity:    Worry: Not on file    Inability: Not on file  . Transportation needs:    Medical: Not on file    Non-medical: Not on file  Tobacco Use  . Smoking status: Never Smoker  . Smokeless tobacco: Never Used  Substance and Sexual Activity  . Alcohol use: No    Comment: occasionally  . Drug use: No  . Sexual activity: Not on file  Lifestyle  . Physical activity:    Days per week: Not on file    Minutes per session: Not on file  . Stress: Not on file  Relationships  . Social connections:    Talks on phone: Not on file    Gets together: Not on file    Attends religious service: Not on file    Active member of club or organization: Not on file    Attends meetings of clubs or organizations: Not on file    Relationship status: Not on file  Other Topics Concern  . 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    Outpatient Medications Prior to Visit  Medication Sig Dispense Refill  . Biotin (BIOTIN 5000) 5 MG CAPS Take 1 capsule by mouth daily.    Marland Kitchen buPROPion (WELLBUTRIN XL) 300 MG 24 hr tablet TAKE 1 TABLET BY MOUTH EVERY MORNING 30 tablet 0  . cetirizine (ZYRTEC) 10 MG tablet Take 10 mg by mouth daily.    . Cholecalciferol (VITAMIN D3) 2000 units TABS Take 1 tablet by mouth daily.    . DULoxetine (CYMBALTA) 60 MG capsule TAKE 1 CAPSULE BY MOUTH ONCE DAILY 90 capsule 1  . eszopiclone (LUNESTA) 2 MG TABS tablet Take 1 tablet (2 mg total) by mouth at bedtime as needed for sleep. Take immediately before bedtime 30 tablet 2  . FALMINA 0.1-20 MG-MCG tablet Take 1 tablet by mouth daily.  10  . metFORMIN  (GLUCOPHAGE) 850 MG tablet TAKE 1 TABLET BY MOUTH TWICE DAILY 60 tablet 0  . OVER THE COUNTER MEDICATION [facility] OTC Sleep Aid    . spironolactone (ALDACTONE) 100 MG tablet Take 100 mg by mouth 2 (two) times daily.    . SUMAtriptan (IMITREX) 100 MG tablet TAKE 1 TABLET BY MOUTH IMMEDIATELY MAY REPEAT AFTER 2 HOURS 27 tablet 1  . zinc gluconate 50 MG tablet Take 50 mg by mouth daily.    Marland Kitchen zolpidem (AMBIEN) 5 MG tablet take 1 tablet by mouth at bedtime if needed for sleep AVOID REGULAR USE 30 tablet 0   No facility-administered medications prior to visit.      EXAM:  BP 102/78 (BP Location: Right Arm, Patient Position: Sitting, Cuff Size: Normal)   Pulse 93   Temp 98.2 F (36.8 C) (Oral)   Wt 207 lb 12.8 oz (94.3 kg)   BMI 32.30 kg/m   Body mass index is 32.3 kg/m.  GENERAL: vitals reviewed and listed above, alert, oriented, appears well hydrated and in no acute distress PSYCH: pleasant and cooperative,emotinoal  At times   coherent nl thought and speech and  Context and insight  Lab Results  Component Value Date   WBC 6.6 11/03/2017   HGB 14.0 11/03/2017   HCT 42.2 11/03/2017   PLT 354.0 11/03/2017   GLUCOSE 89 11/03/2017   CHOL 266 (H) 11/03/2017   TRIG 189.0 (H) 11/03/2017   HDL 68.60 11/03/2017   LDLDIRECT 158.0 11/06/2012   LDLCALC 160 (H) 11/03/2017   ALT 12 11/03/2017   AST 16 11/03/2017   NA 139 11/03/2017   K 4.4 11/03/2017   CL 101 11/03/2017   CREATININE 1.05 11/03/2017   BUN 14 11/03/2017   CO2 31 11/03/2017   TSH 2.37 11/03/2017   HGBA1C 5.7 (A) 05/09/2018   MICROALBUR 1.1 09/15/2010   BP Readings from Last 3 Encounters:  09/11/18 102/78  08/14/18 118/78  06/29/18 128/72    ASSESSMENT AND PLAN:  Discussed the following assessment and plan:  Adjustment disorder with depressed mood  Insomnia, unspecified type  Medication management  Hyperglycemia  Need for influenza vaccination - Plan: Flu Vaccine QUAD 6+ mos PF IM (Fluarix Quad PF) Not  getting enough sleep  Get child out of her bed and plans  to and add trazodone at night   Counseling   Poss holiday and grieving issues   Still present .   consider  Other options  optimize sleep and  counseling in addition to med changing  .   Total visit > 50% spent counseling and coordinating care as indicated in above note and in instructions to patient .  Keep fu appt   Jan  -Patient advised to return or notify health care team  if  new concerns arise.  Patient Instructions  Advise  Counseling  Again .    Adding   trazadone or remeron at night may be helpful.   ROV in 3-4 weeks  Or in January .    Neta Mends. Panosh M.D.

## 2018-09-11 ENCOUNTER — Ambulatory Visit (INDEPENDENT_AMBULATORY_CARE_PROVIDER_SITE_OTHER): Payer: 59 | Admitting: Internal Medicine

## 2018-09-11 ENCOUNTER — Encounter: Payer: Self-pay | Admitting: Internal Medicine

## 2018-09-11 ENCOUNTER — Other Ambulatory Visit: Payer: Self-pay | Admitting: Internal Medicine

## 2018-09-11 VITALS — BP 102/78 | HR 93 | Temp 98.2°F | Wt 207.8 lb

## 2018-09-11 DIAGNOSIS — Z79899 Other long term (current) drug therapy: Secondary | ICD-10-CM | POA: Diagnosis not present

## 2018-09-11 DIAGNOSIS — G4700 Insomnia, unspecified: Secondary | ICD-10-CM

## 2018-09-11 DIAGNOSIS — R739 Hyperglycemia, unspecified: Secondary | ICD-10-CM | POA: Diagnosis not present

## 2018-09-11 DIAGNOSIS — Z23 Encounter for immunization: Secondary | ICD-10-CM | POA: Diagnosis not present

## 2018-09-11 DIAGNOSIS — F4321 Adjustment disorder with depressed mood: Secondary | ICD-10-CM

## 2018-09-11 MED ORDER — TRAZODONE HCL 50 MG PO TABS: ORAL_TABLET | ORAL | 3 refills | Status: DC

## 2018-09-11 NOTE — Patient Instructions (Addendum)
Advise  Counseling  Again .    Adding   trazadone or remeron at night may be helpful.   ROV in 3-4 weeks  Or in January .

## 2018-09-17 ENCOUNTER — Other Ambulatory Visit: Payer: Self-pay | Admitting: Internal Medicine

## 2018-09-22 ENCOUNTER — Other Ambulatory Visit: Payer: Self-pay | Admitting: Internal Medicine

## 2018-10-08 ENCOUNTER — Other Ambulatory Visit: Payer: Self-pay | Admitting: Internal Medicine

## 2018-10-20 ENCOUNTER — Other Ambulatory Visit: Payer: Self-pay | Admitting: Internal Medicine

## 2018-11-03 NOTE — Progress Notes (Signed)
Chief Complaint  Patient presents with  . Annual Exam    Dermatology stopped on of her medications for hairloss without explaination.     HPI: Patient  Sandra Hudson  43 y.o. comes in today for Preventive Health Care visit  And Chronic disease management    August  Off spironolactone .  Ran out.  Given  By derm [place].   for  female pattern baldness ,  Also Minoxidil  Also and nizoral.   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  slep taking trazodone and as needed  lunesta   Health Maintenance  Topic Date Due  . PNEUMOCOCCAL POLYSACCHARIDE VACCINE AGE 29-64 HIGH RISK  09/25/1979  . FOOT EXAM  09/25/1987  . URINE MICROALBUMIN  09/16/2011  . HEMOGLOBIN A1C  11/09/2018  . OPHTHALMOLOGY EXAM  04/17/2019  . PAP SMEAR-Modifier  08/27/2021  . TETANUS/TDAP  02/02/2024  . INFLUENZA VACCINE  Completed  . HIV Screening  Completed   Health Maintenance Review LIFESTYLE:  Exercise:  some Tobacco/ETS: no Alcohol:  no Sugar beverages: Sleep: problematic    Ave 5-6 goal is 8   2 young children and obligations .  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 GI /GU: No adominal pain, vomiting, change in bowel habits. No blood in the stool. No significant GU symptoms. SKIN/HEME: ,no acute skin rashes suspicious lesions or bleeding. No lymphadenopathy, nodules, masses.  NEURO/ PSYCH:  No neurologic signs such as weakness numbness. No depression anxiety. IMM/ Allergy: No unusual infections.  Allergy .   REST of 12 system review negative except as per HPI   Past Medical History:  Diagnosis  Date  . Allergic rhinitis due to dust   . Allergy   . Breech presentation 01/29/2014  . Female infertility   . Headache(784.0)   . Pre-eclampsia 01/29/2014  . Pregnancy induced hypertension   . Status post primary low transverse cesarean section 02/01/2014    Past Surgical History:  Procedure Laterality Date  . CESAREAN SECTION N/A 01/31/2014   Procedure: CESAREAN SECTION;  Surgeon: Delice Lesch, MD;  Location: [facility];  Service: Obstetrics;  Laterality: N/A;  . NASAL SEPTUM SURGERY     deviated septum and large turbinate    Family History  Problem Relation Age of Onset  . Depression Father   . Depression Mother   . Breast cancer Unknown        maternal great aunt  . Breast cancer Paternal Grandmother   . ALS Father        multiple system atropy    Social History   Socioeconomic History  . Marital status: Married    Spouse name: Not on file  . Number of children: 1  . Years of education: Not on file  . Highest education level: Not on file  Occupational History    Employer: UNEMPLOYED  Social Needs  . Financial resource strain: Not on file  . Food insecurity:    Worry: Not on file    Inability: Not on file  . Transportation  needs:    Medical: Not on file    Non-medical: Not on file  Tobacco Use  . Smoking status: Never Smoker  . Smokeless tobacco: Never Used  Substance and Sexual Activity  . Alcohol use: No    Comment: occasionally  . Drug use: No  . Sexual activity: Not on file  Lifestyle  . Physical activity:    Days per week: Not on file    Minutes per session: Not on file  . Stress: Not on file  Relationships  . Social connections:    Talks on phone: Not on file    Gets together: Not on file    Attends religious service: Not on file    Active member of club or organization: Not on file    Attends meetings of clubs or organizations: Not on file    Relationship status: Not on file  Other Topics Concern  . 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    Outpatient Medications Prior to Visit  Medication Sig Dispense Refill  . Biotin (BIOTIN 5000) 5 MG CAPS Take 1 capsule by mouth daily.    Marland Kitchen buPROPion (WELLBUTRIN XL) 300 MG 24 hr tablet TAKE 1 TABLET BY MOUTH EVERY MORNING 30 tablet 2  . cetirizine (ZYRTEC) 10 MG tablet Take 10 mg by mouth daily.    . Cholecalciferol (VITAMIN D3) 2000 units TABS Take 1 tablet by mouth daily.    . DULoxetine (CYMBALTA) 60 MG capsule TAKE 1 CAPSULE BY MOUTH ONCE DAILY 90 capsule 0  . eszopiclone (LUNESTA) 2 MG TABS tablet Take 1 tablet (2 mg total) by mouth at bedtime as needed for sleep. Take immediately before bedtime 30 tablet 2  . FALMINA 0.1-20 MG-MCG tablet Take 1 tablet by mouth daily.  10  . metFORMIN (GLUCOPHAGE) 850 MG tablet TAKE 1 TABLET BY MOUTH TWICE DAILY 60 tablet 0  . OVER THE COUNTER MEDICATION [facility] OTC Sleep Aid    . SUMAtriptan (IMITREX) 100 MG tablet TAKE 1 TABLET BY MOUTH IMMEDIATELY MAY REPEAT AFTER 2 HOURS 27 tablet 0  . traZODone (DESYREL) 50 MG tablet Take 1/2 -1 po hs as directed 30 tablet 3  . zinc gluconate 50 MG tablet Take 50 mg by mouth daily.    Marland Kitchen zolpidem (AMBIEN) 5 MG tablet take 1 tablet by mouth at bedtime if needed for sleep AVOID REGULAR USE 30 tablet 0  . spironolactone (ALDACTONE) 100 MG tablet Take 100 mg by mouth 2 (two) times daily.     No facility-administered medications prior to visit.      EXAM:  BP 118/82 (BP Location: Right Arm, Patient Position: Sitting, Cuff Size: Normal)   Pulse 89   Temp 98 F (36.7 C) (Oral)   Ht 5' 7.13" (1.705 m)   Wt 207 lb 14.4 oz (94.3 kg)   BMI 32.44 kg/m   Body mass index is 32.44 kg/m. Wt Readings from Last 3 Encounters:  11/05/18 207 lb 14.4 oz (94.3 kg)  09/11/18 207 lb 12.8 oz (94.3 kg)  08/14/18 202 lb (91.6 kg)    Physical Exam: Vital signs reviewed WGN:FAOZ is a well-developed well-nourished  alert cooperative    who appearsr stated age in no acute distress.  HEENT: normocephalic atraumatic , Eyes: PERRL EOM's full, conjunctiva clear, Nares: paten,t  no deformity discharge or tenderness., Ears: no deformity EAC's clear TMs with normal landmarks. Mouth: clear OP, no lesions, edema.  Moist mucous membranes. Dentition in adequate repair. NECK: supple without masses, thyromegaly or bruits. CHEST/PULM:  Clear to auscultation and percussion breath sounds equal no wheeze , rales or rhonchi. No chest wall deformities or tenderness. Breast: normal by inspection . No dimpling, discharge, masses, tenderness or discharge . CV: PMI is nondisplaced, S1 S2 no gallops, murmurs, rubs. Peripheral pulses are full without delay.No JVD .  ABDOMEN: Bowel sounds normal nontender  No guard or rebound, no hepato splenomegal no CVA tenderness.  No hernia. Extremtities:  No clubbing cyanosis or edema, no acute joint swelling or redness no focal atrophy NEURO:  Oriented x3, cranial nerves 3-12 appear to be intact, no obvious focal weakness,gait within normal limits no abnormal reflexes or asymmetrical SKIN: No acute rashes normal turgor, color, no bruising or petechiae. PSYCH: Oriented, good eye contact, no obvious depression anxiety, cognition and judgment appear normal. LN: no cervical axillary inguinal adenopathy  Lab Results  Component Value Date   WBC 5.8 11/05/2018   HGB 13.0 11/05/2018   HCT 39.6 11/05/2018   PLT 358.0 11/05/2018   GLUCOSE 92 11/05/2018   CHOL 245 (H) 11/05/2018   TRIG 224.0 (H) 11/05/2018   HDL 64.60 11/05/2018   LDLDIRECT 151.0 11/05/2018   LDLCALC 160 (H) 11/03/2017   ALT 13 11/05/2018   AST 15 11/05/2018   NA 137 11/05/2018   K 4.6 11/05/2018   CL 101 11/05/2018   CREATININE 1.05 11/05/2018   BUN 13 11/05/2018   CO2 28 11/05/2018   TSH 3.54 11/05/2018   HGBA1C 6.0 11/05/2018   MICROALBUR 1.1 09/15/2010    BP Readings from Last 3 Encounters:  11/05/18 118/82    09/11/18 102/78  08/14/18 118/78   Wt Readings from Last 3 Encounters:  11/05/18 207 lb 14.4 oz (94.3 kg)  09/11/18 207 lb 12.8 oz (94.3 kg)  08/14/18 202 lb (91.6 kg)   Lab plan  reviewed with patient   ASSESSMENT AND PLAN:  Discussed the following assessment and plan:  Visit for preventive health examination - Plan: Basic metabolic panel, CBC with Differential/Platelet, Hemoglobin A1c, Hepatic function panel, Lipid panel, TSH  Medication management - Plan: Basic metabolic panel, CBC with Differential/Platelet, Hemoglobin A1c, Hepatic function panel, Lipid panel, TSH  Adjustment disorder with depressed mood - Plan: Basic metabolic panel, CBC with Differential/Platelet, Hemoglobin A1c, Hepatic function panel, Lipid panel, TSH  History of gestational diabetes - Plan: Basic metabolic panel, CBC with Differential/Platelet, Hemoglobin A1c, Hepatic function panel, Lipid panel, TSH  Hyperglycemia - Plan: Basic metabolic panel, CBC with Differential/Platelet, Hemoglobin A1c, Hepatic function panel, Lipid panel, TSH  Hyperlipidemia, unspecified hyperlipidemia type - Plan: Basic metabolic panel, CBC with Differential/Platelet, Hemoglobin A1c, Hepatic function panel, Lipid panel, TSH  Female pattern hair loss  same meds for now  Labs and plan fu 4-6 mos depending   Proceed with counseling as disc  healthier eating and activity  Benefit more than risk of medications  to continue.  Patient Care Team: Burnis Medin, MD as PCP - General Melida Quitter, MD Donnel Saxon, CNM as Midwife Pyrtle, Lajuan Lines, MD (Gastroenterology) [place], Jennefer Bravo, MD as Referring Physician (Dermatology) Patient Instructions  Continue  Medication   check with derm office for fu of spironolactone .   Plan ROV in 4-6 months depending on how doing    Health Maintenance, Female Adopting a healthy lifestyle and getting preventive  care can go a long way to promote health and wellness. Talk with your health  care provider about what schedule of regular examinations is right for you. This is a good chance for you to check in with your provider about disease prevention and staying healthy. In between checkups, there are plenty of things you can do on your own. Experts have done a lot of research about which lifestyle changes and preventive measures are most likely to keep you healthy. Ask your health care provider for more information. Weight and diet Eat a healthy diet  Be sure to include plenty of vegetables, fruits, low-fat dairy products, and lean protein.  Do not eat a lot of foods high in solid fats, added sugars, or salt.  Get regular exercise. This is one of the most important things you can do for your health. ? Most adults should exercise for at least 150 minutes each week. The exercise should increase your heart rate and make you sweat (moderate-intensity exercise). ? Most adults should also do strengthening exercises at least twice a week. This is in addition to the moderate-intensity exercise. Maintain a healthy weight  Body mass index (BMI) is a measurement that can be used to identify possible weight problems. It estimates body fat based on height and weight. Your health care provider can help determine your BMI and help you achieve or maintain a healthy weight.  For females 40 years of age and older: ? A BMI below 18.5 is considered underweight. ? A BMI of 18.5 to 24.9 is normal. ? A BMI of 25 to 29.9 is considered overweight. ? A BMI of 30 and above is considered obese. Watch levels of cholesterol and blood lipids  You should start having your blood tested for lipids and cholesterol at 42 years of age, then have this test every 5 years.  You may need to have your cholesterol levels checked more often if: ? Your lipid or cholesterol levels are high. ? You are older than 42 years of age. ? You are at high risk for heart disease. Cancer screening Lung Cancer  Lung cancer  screening is recommended for adults 77-74 years old who are at high risk for lung cancer because of a history of smoking.  A yearly low-dose CT scan of the lungs is recommended for people who: ? Currently smoke. ? Have quit within the past 15 years. ? Have at least a 30-pack-year history of smoking. A pack year is smoking an average of one pack of cigarettes a day for 1 year.  Yearly screening should continue until it has been 15 years since you quit.  Yearly screening should stop if you develop a health problem that would prevent you from having lung cancer treatment. Breast Cancer  Practice breast self-awareness. This means understanding how your breasts normally appear and feel.  It also means doing regular breast self-exams. Let your health care provider know about any changes, no matter how small.  If you are in your 20s or 30s, you should have a clinical breast exam (CBE) by a health care provider every 1-3 years as part of a regular health exam.  If you are 26 or older, have a CBE every year. Also consider having a breast X-ray (mammogram) every year.  If you have a family history of breast cancer, talk to your health care provider about genetic screening.  If you are at high risk for breast cancer, talk to your health care provider about having an MRI  and a mammogram every year.  Breast cancer gene (BRCA) assessment is recommended for women who have family members with BRCA-related cancers. BRCA-related cancers include: ? Breast. ? Ovarian. ? Tubal. ? Peritoneal cancers.  Results of the assessment will determine the need for genetic counseling and BRCA1 and BRCA2 testing. Cervical Cancer Your health care provider may recommend that you be screened regularly for cancer of the pelvic organs (ovaries, uterus, and vagina). This screening involves a pelvic examination, including checking for microscopic changes to the surface of your cervix (Pap test). You may be encouraged to have  this screening done every 3 years, beginning at age 2.  For women ages 59-65, health care providers may recommend pelvic exams and Pap testing every 3 years, or they may recommend the Pap and pelvic exam, combined with testing for human papilloma virus (HPV), every 5 years. Some types of HPV increase your risk of cervical cancer. Testing for HPV may also be done on women of any age with unclear Pap test results.  Other health care providers may not recommend any screening for nonpregnant women who are considered low risk for pelvic cancer and who do not have symptoms. Ask your health care provider if a screening pelvic exam is right for you.  If you have had past treatment for cervical cancer or a condition that could lead to cancer, you need Pap tests and screening for cancer for at least 20 years after your treatment. If Pap tests have been discontinued, your risk factors (such as having a new sexual partner) need to be reassessed to determine if screening should resume. Some women have medical problems that increase the chance of getting cervical cancer. In these cases, your health care provider may recommend more frequent screening and Pap tests. Colorectal Cancer  This type of cancer can be detected and often prevented.  Routine colorectal cancer screening usually begins at 42 years of age and continues through 42 years of age.  Your health care provider may recommend screening at an earlier age if you have risk factors for colon cancer.  Your health care provider may also recommend using home test kits to check for hidden blood in the stool.  A small camera at the end of a tube can be used to examine your colon directly (sigmoidoscopy or colonoscopy). This is done to check for the earliest forms of colorectal cancer.  Routine screening usually begins at age 59.  Direct examination of the colon should be repeated every 5-10 years through 42 years of age. However, you may need to be  screened more often if early forms of precancerous polyps or small growths are found. Skin Cancer  Check your skin from head to toe regularly.  Tell your health care provider about any new moles or changes in moles, especially if there is a change in a mole's shape or color.  Also tell your health care provider if you have a mole that is larger than the size of a pencil eraser.  Always use sunscreen. Apply sunscreen liberally and repeatedly throughout the day.  Protect yourself by wearing long sleeves, pants, a wide-brimmed hat, and sunglasses whenever you are outside. Heart disease, diabetes, and high blood pressure  High blood pressure causes heart disease and increases the risk of stroke. High blood pressure is more likely to develop in: ? People who have blood pressure in the high end of the normal range (130-139/85-89 mm Hg). ? People who are overweight or obese. ? People who are  African American.  If you are 9-21 years of age, have your blood pressure checked every 3-5 years. If you are 52 years of age or older, have your blood pressure checked every year. You should have your blood pressure measured twice-once when you are at a hospital or clinic, and once when you are not at a hospital or clinic. Record the average of the two measurements. To check your blood pressure when you are not at a hospital or clinic, you can use: ? An automated blood pressure machine at a pharmacy. ? A home blood pressure monitor.  If you are between 38 years and 62 years old, ask your health care provider if you should take aspirin to prevent strokes.  Have regular diabetes screenings. This involves taking a blood sample to check your fasting blood sugar level. ? If you are at a normal weight and have a low risk for diabetes, have this test once every three years after 41 years of age. ? If you are overweight and have a high risk for diabetes, consider being tested at a younger age or more  often. Preventing infection Hepatitis B  If you have a higher risk for hepatitis B, you should be screened for this virus. You are considered at high risk for hepatitis B if: ? You were born in a country where hepatitis B is common. Ask your health care provider which countries are considered high risk. ? Your parents were born in a high-risk country, and you have not been immunized against hepatitis B (hepatitis B vaccine). ? You have HIV or AIDS. ? You use needles to inject street drugs. ? You live with someone who has hepatitis B. ? You have had sex with someone who has hepatitis B. ? You get hemodialysis treatment. ? You take certain medicines for conditions, including cancer, organ transplantation, and autoimmune conditions. Hepatitis C  Blood testing is recommended for: ? Everyone born from [birth year] through 1964. ? Anyone with known risk factors for hepatitis C. Sexually transmitted infections (STIs)  You should be screened for sexually transmitted infections (STIs) including gonorrhea and chlamydia if: ? You are sexually active and are younger than 42 years of age. ? You are older than 42 years of age and your health care provider tells you that you are at risk for this type of infection. ? Your sexual activity has changed since you were last screened and you are at an increased risk for chlamydia or gonorrhea. Ask your health care provider if you are at risk.  If you do not have HIV, but are at risk, it may be recommended that you take a prescription medicine daily to prevent HIV infection. This is called pre-exposure prophylaxis (PrEP). You are considered at risk if: ? You are sexually active and do not regularly use condoms or know the HIV status of your partner(s). ? You take drugs by injection. ? You are sexually active with a partner who has HIV. Talk with your health care provider about whether you are at high risk of being infected with HIV. If you choose to begin PrEP, you  should first be tested for HIV. You should then be tested every 3 months for as long as you are taking PrEP. Pregnancy  If you are premenopausal and you may become pregnant, ask your health care provider about preconception counseling.  If you may become pregnant, take 400 to 800 micrograms (mcg) of folic acid every day.  If you want to prevent pregnancy, talk  to your health care provider about birth control (contraception). Osteoporosis and menopause  Osteoporosis is a disease in which the bones lose minerals and strength with aging. This can result in serious bone fractures. Your risk for osteoporosis can be identified using a bone density scan.  If you are 40 years of age or older, or if you are at risk for osteoporosis and fractures, ask your health care provider if you should be screened.  Ask your health care provider whether you should take a calcium or vitamin D supplement to lower your risk for osteoporosis.  Menopause may have certain physical symptoms and risks.  Hormone replacement therapy may reduce some of these symptoms and risks. Talk to your health care provider about whether hormone replacement therapy is right for you. Follow these instructions at home:  Schedule regular health, dental, and eye exams.  Stay current with your immunizations.  Do not use any tobacco products including cigarettes, chewing tobacco, or electronic cigarettes.  If you are pregnant, do not drink alcohol.  If you are breastfeeding, limit how much and how often you drink alcohol.  Limit alcohol intake to no more than 1 drink per day for nonpregnant women. One drink equals 12 ounces of beer, 5 ounces of wine, or 1 ounces of hard liquor.  Do not use street drugs.  Do not share needles.  Ask your health care provider for help if you need support or information about quitting drugs.  Tell your health care provider if you often feel depressed.  Tell your health care provider if you have  ever been abused or do not feel safe at home. This information is not intended to replace advice given to you by your health care provider. Make sure you discuss any questions you have with your health care provider. Document Released: 04/18/2011 Document Revised: 03/10/2016 Document Reviewed: 07/07/2015 Elsevier Interactive Patient Education  2019 Sullivan K. Michial Disney M.D.

## 2018-11-05 ENCOUNTER — Encounter: Payer: Self-pay | Admitting: Internal Medicine

## 2018-11-05 ENCOUNTER — Ambulatory Visit (INDEPENDENT_AMBULATORY_CARE_PROVIDER_SITE_OTHER): Payer: 59 | Admitting: Internal Medicine

## 2018-11-05 VITALS — BP 118/82 | HR 89 | Temp 98.0°F | Ht 67.13 in | Wt 207.9 lb

## 2018-11-05 DIAGNOSIS — E785 Hyperlipidemia, unspecified: Secondary | ICD-10-CM

## 2018-11-05 DIAGNOSIS — R739 Hyperglycemia, unspecified: Secondary | ICD-10-CM | POA: Diagnosis not present

## 2018-11-05 DIAGNOSIS — F4321 Adjustment disorder with depressed mood: Secondary | ICD-10-CM

## 2018-11-05 DIAGNOSIS — Z0000 Encounter for general adult medical examination without abnormal findings: Secondary | ICD-10-CM | POA: Diagnosis not present

## 2018-11-05 DIAGNOSIS — Z8632 Personal history of gestational diabetes: Secondary | ICD-10-CM | POA: Diagnosis not present

## 2018-11-05 DIAGNOSIS — L658 Other specified nonscarring hair loss: Secondary | ICD-10-CM | POA: Diagnosis not present

## 2018-11-05 DIAGNOSIS — Z79899 Other long term (current) drug therapy: Secondary | ICD-10-CM

## 2018-11-05 LAB — CBC WITH DIFFERENTIAL/PLATELET
Basophils Absolute: 0 10*3/uL (ref 0.0–0.1)
Basophils Relative: 0.7 % (ref 0.0–3.0)
Eosinophils Absolute: 0.1 10*3/uL (ref 0.0–0.7)
Eosinophils Relative: 2.5 % (ref 0.0–5.0)
HCT: 39.6 % (ref 36.0–46.0)
Hemoglobin: 13 g/dL (ref 12.0–15.0)
LYMPHS ABS: 1.8 10*3/uL (ref 0.7–4.0)
Lymphocytes Relative: 31.7 % (ref 12.0–46.0)
MCHC: 32.9 g/dL (ref 30.0–36.0)
MCV: 84.2 fl (ref 78.0–100.0)
Monocytes Absolute: 0.3 10*3/uL (ref 0.1–1.0)
Monocytes Relative: 5.3 % (ref 3.0–12.0)
NEUTROS PCT: 59.8 % (ref 43.0–77.0)
Neutro Abs: 3.5 10*3/uL (ref 1.4–7.7)
Platelets: 358 10*3/uL (ref 150.0–400.0)
RBC: 4.7 Mil/uL (ref 3.87–5.11)
RDW: 14.5 % (ref 11.5–15.5)
WBC: 5.8 10*3/uL (ref 4.0–10.5)

## 2018-11-05 LAB — HEPATIC FUNCTION PANEL
ALT: 13 U/L (ref 0–35)
AST: 15 U/L (ref 0–37)
Albumin: 4 g/dL (ref 3.5–5.2)
Alkaline Phosphatase: 61 U/L (ref 39–117)
Bilirubin, Direct: 0 mg/dL (ref 0.0–0.3)
Total Bilirubin: 0.2 mg/dL (ref 0.2–1.2)
Total Protein: 7.4 g/dL (ref 6.0–8.3)

## 2018-11-05 LAB — LIPID PANEL
Cholesterol: 245 mg/dL — ABNORMAL HIGH (ref 0–200)
HDL: 64.6 mg/dL (ref >39.00)
NonHDL: 180.29
Total CHOL/HDL Ratio: 4
Triglycerides: 224 mg/dL — ABNORMAL HIGH (ref 0.0–149.0)
VLDL: 44.8 mg/dL — ABNORMAL HIGH (ref 0.0–40.0)

## 2018-11-05 LAB — HEMOGLOBIN A1C: Hgb A1c MFr Bld: 6 % (ref 4.6–6.5)

## 2018-11-05 LAB — BASIC METABOLIC PANEL
BUN: 13 mg/dL (ref 6–23)
CO2: 28 mEq/L (ref 19–32)
Calcium: 10.1 mg/dL (ref 8.4–10.5)
Chloride: 101 mEq/L (ref 96–112)
Creatinine, Ser: 1.05 mg/dL (ref 0.40–1.20)
GFR: 57.72 mL/min — ABNORMAL LOW (ref >60.00)
GLUCOSE: 92 mg/dL (ref 70–99)
Potassium: 4.6 mEq/L (ref 3.5–5.1)
Sodium: 137 mEq/L (ref 135–145)

## 2018-11-05 LAB — TSH: TSH: 3.54 u[IU]/mL (ref 0.35–4.50)

## 2018-11-05 LAB — LDL CHOLESTEROL, DIRECT: Direct LDL: 151 mg/dL

## 2018-11-05 NOTE — Patient Instructions (Addendum)
Continue  Medication   check with derm office for fu of spironolactone .   Plan ROV in 4-6 months depending on how doing    Health Maintenance, Female Adopting a healthy lifestyle and getting preventive care can go a long way to promote health and wellness. Talk with your health care provider about what schedule of regular examinations is right for you. This is a good chance for you to check in with your provider about disease prevention and staying healthy. In between checkups, there are plenty of things you can do on your own. Experts have done a lot of research about which lifestyle changes and preventive measures are most likely to keep you healthy. Ask your health care provider for more information. Weight and diet Eat a healthy diet  Be sure to include plenty of vegetables, fruits, low-fat dairy products, and lean protein.  Do not eat a lot of foods high in solid fats, added sugars, or salt.  Get regular exercise. This is one of the most important things you can do for your health. ? Most adults should exercise for at least 150 minutes each week. The exercise should increase your heart rate and make you sweat (moderate-intensity exercise). ? Most adults should also do strengthening exercises at least twice a week. This is in addition to the moderate-intensity exercise. Maintain a healthy weight  Body mass index (BMI) is a measurement that can be used to identify possible weight problems. It estimates body fat based on height and weight. Your health care provider can help determine your BMI and help you achieve or maintain a healthy weight.  For females 89 years of age and older: ? A BMI below 18.5 is considered underweight. ? A BMI of 18.5 to 24.9 is normal. ? A BMI of 25 to 29.9 is considered overweight. ? A BMI of 30 and above is considered obese. Watch levels of cholesterol and blood lipids  You should start having your blood tested for lipids and cholesterol at 42 years of  age, then have this test every 5 years.  You may need to have your cholesterol levels checked more often if: ? Your lipid or cholesterol levels are high. ? You are older than 42 years of age. ? You are at high risk for heart disease. Cancer screening Lung Cancer  Lung cancer screening is recommended for adults 73-64 years old who are at high risk for lung cancer because of a history of smoking.  A yearly low-dose CT scan of the lungs is recommended for people who: ? Currently smoke. ? Have quit within the past 15 years. ? Have at least a 30-pack-year history of smoking. A pack year is smoking an average of one pack of cigarettes a day for 1 year.  Yearly screening should continue until it has been 15 years since you quit.  Yearly screening should stop if you develop a health problem that would prevent you from having lung cancer treatment. Breast Cancer  Practice breast self-awareness. This means understanding how your breasts normally appear and feel.  It also means doing regular breast self-exams. Let your health care provider know about any changes, no matter how small.  If you are in your 20s or 30s, you should have a clinical breast exam (CBE) by a health care provider every 1-3 years as part of a regular health exam.  If you are 3 or older, have a CBE every year. Also consider having a breast X-ray (mammogram) every year.  If  you have a family history of breast cancer, talk to your health care provider about genetic screening.  If you are at high risk for breast cancer, talk to your health care provider about having an MRI and a mammogram every year.  Breast cancer gene (BRCA) assessment is recommended for women who have family members with BRCA-related cancers. BRCA-related cancers include: ? Breast. ? Ovarian. ? Tubal. ? Peritoneal cancers.  Results of the assessment will determine the need for genetic counseling and BRCA1 and BRCA2 testing. Cervical Cancer Your  health care provider may recommend that you be screened regularly for cancer of the pelvic organs (ovaries, uterus, and vagina). This screening involves a pelvic examination, including checking for microscopic changes to the surface of your cervix (Pap test). You may be encouraged to have this screening done every 3 years, beginning at age 45.  For women ages 57-65, health care providers may recommend pelvic exams and Pap testing every 3 years, or they may recommend the Pap and pelvic exam, combined with testing for human papilloma virus (HPV), every 5 years. Some types of HPV increase your risk of cervical cancer. Testing for HPV may also be done on women of any age with unclear Pap test results.  Other health care providers may not recommend any screening for nonpregnant women who are considered low risk for pelvic cancer and who do not have symptoms. Ask your health care provider if a screening pelvic exam is right for you.  If you have had past treatment for cervical cancer or a condition that could lead to cancer, you need Pap tests and screening for cancer for at least 20 years after your treatment. If Pap tests have been discontinued, your risk factors (such as having a new sexual partner) need to be reassessed to determine if screening should resume. Some women have medical problems that increase the chance of getting cervical cancer. In these cases, your health care provider may recommend more frequent screening and Pap tests. Colorectal Cancer  This type of cancer can be detected and often prevented.  Routine colorectal cancer screening usually begins at 42 years of age and continues through 42 years of age.  Your health care provider may recommend screening at an earlier age if you have risk factors for colon cancer.  Your health care provider may also recommend using home test kits to check for hidden blood in the stool.  A small camera at the end of a tube can be used to examine your  colon directly (sigmoidoscopy or colonoscopy). This is done to check for the earliest forms of colorectal cancer.  Routine screening usually begins at age 22.  Direct examination of the colon should be repeated every 5-10 years through 42 years of age. However, you may need to be screened more often if early forms of precancerous polyps or small growths are found. Skin Cancer  Check your skin from head to toe regularly.  Tell your health care provider about any new moles or changes in moles, especially if there is a change in a mole's shape or color.  Also tell your health care provider if you have a mole that is larger than the size of a pencil eraser.  Always use sunscreen. Apply sunscreen liberally and repeatedly throughout the day.  Protect yourself by wearing long sleeves, pants, a wide-brimmed hat, and sunglasses whenever you are outside. Heart disease, diabetes, and high blood pressure  High blood pressure causes heart disease and increases the risk of stroke.  High blood pressure is more likely to develop in: ? People who have blood pressure in the high end of the normal range (130-139/85-89 mm Hg). ? People who are overweight or obese. ? People who are African American.  If you are 75-52 years of age, have your blood pressure checked every 3-5 years. If you are 27 years of age or older, have your blood pressure checked every year. You should have your blood pressure measured twice-once when you are at a hospital or clinic, and once when you are not at a hospital or clinic. Record the average of the two measurements. To check your blood pressure when you are not at a hospital or clinic, you can use: ? An automated blood pressure machine at a pharmacy. ? A home blood pressure monitor.  If you are between 66 years and 11 years old, ask your health care provider if you should take aspirin to prevent strokes.  Have regular diabetes screenings. This involves taking a blood sample to  check your fasting blood sugar level. ? If you are at a normal weight and have a low risk for diabetes, have this test once every three years after 42 years of age. ? If you are overweight and have a high risk for diabetes, consider being tested at a younger age or more often. Preventing infection Hepatitis B  If you have a higher risk for hepatitis B, you should be screened for this virus. You are considered at high risk for hepatitis B if: ? You were born in a country where hepatitis B is common. Ask your health care provider which countries are considered high risk. ? Your parents were born in a high-risk country, and you have not been immunized against hepatitis B (hepatitis B vaccine). ? You have HIV or AIDS. ? You use needles to inject street drugs. ? You live with someone who has hepatitis B. ? You have had sex with someone who has hepatitis B. ? You get hemodialysis treatment. ? You take certain medicines for conditions, including cancer, organ transplantation, and autoimmune conditions. Hepatitis C  Blood testing is recommended for: ? Everyone born from [birth year] through 1964. ? Anyone with known risk factors for hepatitis C. Sexually transmitted infections (STIs)  You should be screened for sexually transmitted infections (STIs) including gonorrhea and chlamydia if: ? You are sexually active and are younger than 42 years of age. ? You are older than 42 years of age and your health care provider tells you that you are at risk for this type of infection. ? Your sexual activity has changed since you were last screened and you are at an increased risk for chlamydia or gonorrhea. Ask your health care provider if you are at risk.  If you do not have HIV, but are at risk, it may be recommended that you take a prescription medicine daily to prevent HIV infection. This is called pre-exposure prophylaxis (PrEP). You are considered at risk if: ? You are sexually active and do not regularly use  condoms or know the HIV status of your partner(s). ? You take drugs by injection. ? You are sexually active with a partner who has HIV. Talk with your health care provider about whether you are at high risk of being infected with HIV. If you choose to begin PrEP, you should first be tested for HIV. You should then be tested every 3 months for as long as you are taking PrEP. Pregnancy  If you are premenopausal and  you may become pregnant, ask your health care provider about preconception counseling.  If you may become pregnant, take 400 to 800 micrograms (mcg) of folic acid every day.  If you want to prevent pregnancy, talk to your health care provider about birth control (contraception). Osteoporosis and menopause  Osteoporosis is a disease in which the bones lose minerals and strength with aging. This can result in serious bone fractures. Your risk for osteoporosis can be identified using a bone density scan.  If you are 65 years of age or older, or if you are at risk for osteoporosis and fractures, ask your health care provider if you should be screened.  Ask your health care provider whether you should take a calcium or vitamin D supplement to lower your risk for osteoporosis.  Menopause may have certain physical symptoms and risks.  Hormone replacement therapy may reduce some of these symptoms and risks. Talk to your health care provider about whether hormone replacement therapy is right for you. Follow these instructions at home:  Schedule regular health, dental, and eye exams.  Stay current with your immunizations.  Do not use any tobacco products including cigarettes, chewing tobacco, or electronic cigarettes.  If you are pregnant, do not drink alcohol.  If you are breastfeeding, limit how much and how often you drink alcohol.  Limit alcohol intake to no more than 1 drink per day for nonpregnant women. One drink equals 12 ounces of beer, 5 ounces of wine, or 1 ounces of  hard liquor.  Do not use street drugs.  Do not share needles.  Ask your health care provider for help if you need support or information about quitting drugs.  Tell your health care provider if you often feel depressed.  Tell your health care provider if you have ever been abused or do not feel safe at home. This information is not intended to replace advice given to you by your health care provider. Make sure you discuss any questions you have with your health care provider. Document Released: 04/18/2011 Document Revised: 03/10/2016 Document Reviewed: 07/07/2015 Elsevier Interactive Patient Education  2019 [employer].

## 2018-11-07 ENCOUNTER — Other Ambulatory Visit: Payer: Self-pay | Admitting: Internal Medicine

## 2018-11-08 NOTE — Telephone Encounter (Signed)
Please advise Dr Panosh, thanks.   

## 2018-11-10 ENCOUNTER — Other Ambulatory Visit: Payer: Self-pay | Admitting: Internal Medicine

## 2018-11-14 ENCOUNTER — Encounter: Payer: Self-pay | Admitting: Pulmonary Disease

## 2018-11-14 ENCOUNTER — Ambulatory Visit: Payer: 59 | Admitting: Pulmonary Disease

## 2018-11-14 VITALS — BP 118/62 | HR 100 | Ht 67.0 in | Wt 209.0 lb

## 2018-11-14 DIAGNOSIS — F5104 Psychophysiologic insomnia: Secondary | ICD-10-CM | POA: Diagnosis not present

## 2018-11-14 NOTE — Progress Notes (Signed)
Sandra Hudson    [id number]    04/03/1976  Primary Care Physician:Panosh, Neta [name], MD  Referring Physician: Madelin HeadingsPanosh, [name], MD [address] [place], [place] [id number]  Chief complaint:   Patient with daytime sleepiness Sleep onset and sleep maintenance insomnia Recent sleep study negative for significant sleep disordered breathing, did support insomnia  HPI: Has had difficulty sleeping since school age Difficulty falling asleep, difficulty maintaining sleep Has tried multiple sleep aids in the past, has used [place]unesta, Ambien  She is finally using Lunesta in combination with trazodone-improvement in symptoms She still wakes up a couple of times during the night-able to usually go back to sleep -She wonders if she may be able to transition to just use on one agent which we discussed today -She still feels she wakes up in the morning sometimes feeling like she did not have restorative sleep  Usually goes to bed about 9-11 PM, may take about an hour to fall asleep-this has not changed recently Wakes up about 2-3 times during the night Finally gets out of bed at about 8 to 8:30 in the morning, spouse wakes up about 530-so she is in and out of sleep between 530 and 830 Has significant sleep inertia She does have some daytime sleepiness but does not take naps No significant night sweats She moves around the bed during sleep but does not have any symptoms suggesting restless legs History of nasal surgery for deviated septum  History of depression  Her youngest child sleeps in bed with her at present-she does not think this influences her sleep any significantly  Non-smoker  Outpatient Encounter Medications as of 11/14/2018  Medication Sig  . Biotin (BIOTIN 5000) 5 MG CAPS Take 1 capsule by mouth daily.  Marland Kitchen. buPROPion (WELLBUTRIN XL) 300 MG 24 hr tablet TAKE 1 TABLET BY MOUTH EVERY MORNING  . cetirizine (ZYRTEC) 10 MG tablet Take 10 mg by mouth daily.  .  Cholecalciferol (VITAMIN D3) 2000 units TABS Take 1 tablet by mouth daily.  . DULoxetine (CYMBALTA) 60 MG capsule TAKE 1 CAPSULE BY MOUTH ONCE DAILY  . eszopiclone (LUNESTA) 2 MG TABS tablet Take 1 tablet (2 mg total) by mouth at bedtime as needed for sleep. Take immediately before bedtime  . FALMINA 0.1-20 MG-MCG tablet Take 1 tablet by mouth daily.  . metFORMIN (GLUCOPHAGE) 850 MG tablet TAKE 1 TABLET BY MOUTH TWICE DAILY  . OVER THE COUNTER MEDICATION [facility] OTC Sleep Aid  . spironolactone (ALDACTONE) 100 MG tablet Take 100 mg by mouth 2 (two) times daily.  . SUMAtriptan (IMITREX) 100 MG tablet TAKE 1 TABLET BY MOUTH IMMEDIATELY MAY REPEAT AFTER 2 HOURS  . traZODone (DESYREL) 50 MG tablet Take 1/2 -1 po hs as directed  . zinc gluconate 50 MG tablet Take 50 mg by mouth daily.   No facility-administered encounter medications on file as of 11/14/2018.     Allergies as of 11/14/2018  . (No Known Allergies)    Past Medical History:  Diagnosis Date  . Allergic rhinitis due to dust   . Allergy   . Breech presentation 01/29/2014  . Female infertility   . Headache(784.0)   . Pre-eclampsia 01/29/2014  . Pregnancy induced hypertension   . Status post primary low transverse cesarean section 02/01/2014    Past Surgical History:  Procedure Laterality Date  . CESAREAN SECTION N/A 01/31/2014   Procedure: CESAREAN SECTION;  Surgeon: Purcell NailsAngela Y Roberts, MD;  [facility];  Service: Obstetrics;  Laterality: N/A;  . NASAL SEPTUM SURGERY     deviated septum and large turbinate    Family History  Problem Relation Age of Onset  . Depression Father   . Depression Mother   . Breast cancer Unknown        maternal great aunt  . Breast cancer Paternal Grandmother   . ALS Father        multiple system atropy    Social History   Socioeconomic History  . Marital status: Married    Spouse name: Not on file  . Number of children: 1  . Years of education: Not on file  . Highest education  level: Not on file  Occupational History    Employer: UNEMPLOYED  Social Needs  . Financial resource strain: Not on file  . Food insecurity:    Worry: Not on file    Inability: Not on file  . Transportation needs:    Medical: Not on file    Non-medical: Not on file  Tobacco Use  . Smoking status: Never Smoker  . Smokeless tobacco: Never Used  Substance and Sexual Activity  . Alcohol use: No    Comment: occasionally  . Drug use: No  . Sexual activity: Not on file  Lifestyle  . Physical activity:    Days per week: Not on file    Minutes per session: Not on file  . Stress: Not on file  Relationships  . Social connections:    Talks on phone: Not on file    Gets together: Not on file    Attends religious service: Not on file    Active member of club or organization: Not on file    Attends meetings of clubs or organizations: Not on file    Relationship status: Not on file  . Intimate partner violence:    Fear of current or ex partner: Not on file    Emotionally abused: Not on file    Physically abused: Not on file    Forced sexual activity: Not on file  Other Topics Concern  . 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    Review of Systems  Constitutional: Positive for fatigue.  Respiratory: Negative.   Cardiovascular: Negative.   Gastrointestinal: Negative.   Psychiatric/Behavioral: Positive for sleep disturbance.  All other systems reviewed and are negative.   Vitals:   11/14/18 1049  BP: 118/62  Pulse: 100  SpO2: 100%     Physical Exam  Constitutional: She appears well-developed and well-nourished.  HENT:  Head: Normocephalic and atraumatic.  Mallampati 2  Eyes: Pupils are equal, round, and reactive to light. Conjunctivae and EOM are normal. Right eye exhibits no discharge.  Neck: Normal range of motion. Neck supple. No tracheal deviation present. No  thyromegaly present.  Cardiovascular: Normal rate and regular rhythm.  Pulmonary/Chest: Effort normal and breath sounds normal. No respiratory distress. She has no wheezes.  Abdominal: Soft. Bowel sounds are normal. She exhibits no distension. There is no abdominal tenderness. There is no rebound.   Epworth Sleepiness Scale of 6   Assessment:   Sleep onset and sleep maintenance insomnia -Recent sleep study did rule out significant sleep disordered breathing, did support a diagnosis of insomnia  Nonrestorative sleep  Insomnia -currently on Lunesta and  trazodone-both seem to be helping -She wants to see whether with just 1 agent will be adequate -She will try to stop one agent at a time to see if one of the agents by itself will help  Mood Disorder   Plan  Multiple factors likely contributing to insomnia, nonrestorative sleep -Lunesta and trazodone helping at present  Movement disorder may be contributing  She has had symptoms for many years, multiple factors may be playing a role  Optimization of medications for depression-may also help her insomnia  Continue other lines of care We will see her back in the office in about 3 months Repeat polysomnogram if sleep is still considered nonrestorative   Virl DiamondAdewale Olalere MD [facility] 11/14/2018, 10:56 AM  CC: Panosh, Neta [name], MD

## 2018-11-14 NOTE — Patient Instructions (Signed)
Insomnia  Better with combination of trazodone and Lunesta  Your choice of when to vary the medication  Call if you feel despite the medications, good hours of sleep, your sleep is not as restorative as it should be  We may want to go ahead and obtain a sleep study  We will see you back in about 3 months

## 2018-11-19 DIAGNOSIS — L658 Other specified nonscarring hair loss: Secondary | ICD-10-CM | POA: Diagnosis not present

## 2018-12-08 ENCOUNTER — Other Ambulatory Visit: Payer: Self-pay | Admitting: Pulmonary Disease

## 2018-12-08 DIAGNOSIS — G4700 Insomnia, unspecified: Secondary | ICD-10-CM

## 2018-12-08 DIAGNOSIS — G4701 Insomnia due to medical condition: Secondary | ICD-10-CM

## 2018-12-10 NOTE — Telephone Encounter (Signed)
Dr. Olalere, please advise if it is okay to refill med for pt. Thanks! 

## 2018-12-11 ENCOUNTER — Other Ambulatory Visit: Payer: Self-pay | Admitting: Pulmonary Disease

## 2018-12-15 ENCOUNTER — Other Ambulatory Visit: Payer: Self-pay | Admitting: Internal Medicine

## 2018-12-19 DIAGNOSIS — L700 Acne vulgaris: Secondary | ICD-10-CM | POA: Diagnosis not present

## 2018-12-19 DIAGNOSIS — Z79899 Other long term (current) drug therapy: Secondary | ICD-10-CM | POA: Diagnosis not present

## 2018-12-25 ENCOUNTER — Other Ambulatory Visit: Payer: Self-pay | Admitting: Internal Medicine

## 2019-01-10 ENCOUNTER — Other Ambulatory Visit: Payer: Self-pay | Admitting: Internal Medicine

## 2019-01-12 ENCOUNTER — Other Ambulatory Visit: Payer: Self-pay | Admitting: Internal Medicine

## 2019-01-13 ENCOUNTER — Other Ambulatory Visit: Payer: Self-pay | Admitting: Internal Medicine

## 2019-02-09 ENCOUNTER — Other Ambulatory Visit: Payer: Self-pay | Admitting: Internal Medicine

## 2019-02-13 ENCOUNTER — Ambulatory Visit: Payer: 59 | Admitting: Pulmonary Disease

## 2019-03-12 ENCOUNTER — Other Ambulatory Visit: Payer: Self-pay | Admitting: Internal Medicine

## 2019-03-12 ENCOUNTER — Ambulatory Visit (INDEPENDENT_AMBULATORY_CARE_PROVIDER_SITE_OTHER): Payer: 59 | Admitting: Internal Medicine

## 2019-03-12 ENCOUNTER — Encounter: Payer: Self-pay | Admitting: Internal Medicine

## 2019-03-12 ENCOUNTER — Other Ambulatory Visit: Payer: Self-pay

## 2019-03-12 DIAGNOSIS — Z79899 Other long term (current) drug therapy: Secondary | ICD-10-CM

## 2019-03-12 DIAGNOSIS — F4321 Adjustment disorder with depressed mood: Secondary | ICD-10-CM | POA: Diagnosis not present

## 2019-03-12 DIAGNOSIS — E785 Hyperlipidemia, unspecified: Secondary | ICD-10-CM

## 2019-03-12 DIAGNOSIS — R739 Hyperglycemia, unspecified: Secondary | ICD-10-CM

## 2019-03-12 NOTE — Progress Notes (Signed)
Virtual Visit via Video Note  I connected with@ on 03/12/19 at 10:15 AM EDT by a video enabled telemedicine application and verified that I am speaking with the correct person using two identifiers. Location patient: home Location provider:work office Persons participating in the virtual visit: patient, provider  [place] national recommendations  regarding COVID 19 pandemic   video visit is advised over in office visit for this patient.  Patient aware  of the limitations of evaluation and management by telemedicine and  availability of in person appointments. and agreed to proceed.   HPI: Sandra Hudson presents for video visit Chronic disease management  Psych doing ok good days and  Not so good some dec motivation on some days   Has 2 daughters at home and no school  One is at home virtual learning   No suicidal or worsening    Taking wellbutrin and cymbaltata  Derm still on spironolactone and no problem    Prediabetes  taking 850 bid misses evening dose ocass 1-2 x per week at most   [employer] of 4  Sleep goog but kids going to be late and tend to sleep in  ROS: See pertinent positives and negatives per HPI.  Past Medical History:  Diagnosis Date  . Allergic rhinitis due to dust   . Allergy   . Breech presentation 01/29/2014  . Female infertility   . Headache(784.0)   . Pre-eclampsia 01/29/2014  . Pregnancy induced hypertension   . Status post primary low transverse cesarean section 02/01/2014    Past Surgical History:  Procedure Laterality Date  . CESAREAN SECTION N/A 01/31/2014   Procedure: CESAREAN SECTION;  Surgeon: Purcell Nails, MD;  Location: [facility];  Service: Obstetrics;  Laterality: N/A;  . NASAL SEPTUM SURGERY     deviated septum and large turbinate    Family History  Problem Relation Age of Onset  . Depression Father   . Depression Mother   . Breast cancer Unknown        maternal great aunt  . Breast cancer Paternal Grandmother   . ALS Father        multiple  system atropy    Social History   Tobacco Use  . Smoking status: Never Smoker  . Smokeless tobacco: Never Used  Substance Use Topics  . Alcohol use: No    Comment: occasionally  . Drug use: No      Current Outpatient Medications:  .  Biotin (BIOTIN 5000) 5 MG CAPS, Take 1 capsule by mouth daily., Disp: , Rfl:  .  buPROPion (WELLBUTRIN XL) 300 MG 24 hr tablet, TAKE 1 TABLET BY MOUTH EVERY MORNING, Disp: 30 tablet, Rfl: 2 .  cetirizine (ZYRTEC) 10 MG tablet, Take 10 mg by mouth daily., Disp: , Rfl:  .  Cholecalciferol (VITAMIN D3) 2000 units TABS, Take 1 tablet by mouth daily., Disp: , Rfl:  .  DULoxetine (CYMBALTA) 60 MG capsule, TAKE 1 CAPSULE BY MOUTH ONCE DAILY, Disp: 90 capsule, Rfl: 0 .  eszopiclone (LUNESTA) 2 MG TABS tablet, TAKE 1 TABLET BY MOUTH AT BEDTIME AS NEEDED FOR SLEEP.TAKE IMMEDIATELY BEFORE BEDTIME, Disp: 30 tablet, Rfl: 2 .  FALMINA 0.1-20 MG-MCG tablet, Take 1 tablet by mouth daily., Disp: , Rfl: 10 .  metFORMIN (GLUCOPHAGE) 850 MG tablet, TAKE 1 TABLET BY MOUTH TWICE DAILY, Disp: 60 tablet, Rfl: 0 .  OVER THE COUNTER MEDICATION, [facility] [facility] OTC Sleep Aid, Disp: , Rfl:  .  spironolactone (ALDACTONE) 100 MG tablet, Take 100 mg  by mouth 2 (two) times daily., Disp: , Rfl:  .  SUMAtriptan (IMITREX) 100 MG tablet, TAKE 1 TABLET BY MOUTH IMMEDIATELY MAY REPEAT AFTER 2 HOURS, Disp: 27 tablet, Rfl: 0 .  traZODone (DESYREL) 50 MG tablet, TAKE 1/2 TO 1 TABLET BY MOUTH AT BEDTIME AS DIRECTED, Disp: 30 tablet, Rfl: 3 .  zinc gluconate 50 MG tablet, Take 50 mg by mouth daily., Disp: , Rfl:   EXAM: BP Readings from Last 3 Encounters:  11/14/18 118/62  11/05/18 118/82  09/11/18 102/78   Wt Readings from Last 3 Encounters:  11/14/18 209 lb (94.8 kg)  11/05/18 207 lb 14.4 oz (94.3 kg)  09/11/18 207 lb 12.8 oz (94.3 kg)     VITALS per patient if applicable: weight reported as 202 - 203   GENERAL: alert, oriented, appears well and in no acute distress  HEENT: atraumatic,  conjunttiva clear, no obvious abnormalities on inspection of external nose and ears  NECK: normal movements of the head and neck  LUNGS: on inspection no signs of respiratory distress, breathing rate appears normal, no obvious gross SOB, gasping or wheezing  CV: no obvious cyanosis  MS: moves all visible extremities without noticeable abnormality  PSYCH/NEURO: pleasant and cooperative, no obvious depression or anxiety, speech and thought processing grossly intact Lab Results  Component Value Date   WBC 5.8 11/05/2018   HGB 13.0 11/05/2018   HCT 39.6 11/05/2018   PLT 358.0 11/05/2018   GLUCOSE 92 11/05/2018   CHOL 245 (H) 11/05/2018   TRIG 224.0 (H) 11/05/2018   HDL 64.60 11/05/2018   LDLDIRECT 151.0 11/05/2018   LDLCALC 160 (H) 11/03/2017   ALT 13 11/05/2018   AST 15 11/05/2018   NA 137 11/05/2018   K 4.6 11/05/2018   CL 101 11/05/2018   CREATININE 1.05 11/05/2018   BUN 13 11/05/2018   CO2 28 11/05/2018   TSH 3.54 11/05/2018   HGBA1C 6.0 11/05/2018   MICROALBUR 1.1 09/15/2010    ASSESSMENT AND PLAN:  Discussed the following assessment and plan:  Medication management - Plan: Hemoglobin A1c, Lipid panel, Basic metabolic panel  Hyperglycemia - Plan: Hemoglobin A1c, Lipid panel, Basic metabolic panel  Hyperlipidemia, unspecified hyperlipidemia type - Plan: Hemoglobin A1c, Lipid panel, Basic metabolic panel  Adjustment disorder with depressed mood Lipids and a1c bmp fasting soon  And if all ok then cpx and labs in January  2021 Counseled.   Strategies is shut down ie schedules good days and bad day malaise   Expectant management and discussion of plan and treatment with opportunity to ask questions and all were answered. The patient agreed with the plan and demonstrated an understanding of the instructions. Advised to call back or seek an in-person evaluation if worsening  or having  further concerns .  Berniece AndreasWanda , MD

## 2019-03-25 ENCOUNTER — Other Ambulatory Visit: Payer: Self-pay | Admitting: Pulmonary Disease

## 2019-03-25 DIAGNOSIS — G4700 Insomnia, unspecified: Secondary | ICD-10-CM

## 2019-03-25 DIAGNOSIS — G4701 Insomnia due to medical condition: Secondary | ICD-10-CM

## 2019-03-26 ENCOUNTER — Other Ambulatory Visit: Payer: 59

## 2019-03-26 ENCOUNTER — Other Ambulatory Visit: Payer: Self-pay | Admitting: Pulmonary Disease

## 2019-03-26 ENCOUNTER — Other Ambulatory Visit: Payer: Self-pay | Admitting: Internal Medicine

## 2019-03-26 DIAGNOSIS — G4700 Insomnia, unspecified: Secondary | ICD-10-CM

## 2019-03-26 DIAGNOSIS — G4701 Insomnia due to medical condition: Secondary | ICD-10-CM

## 2019-03-26 MED ORDER — ESZOPICLONE 2 MG PO TABS: ORAL_TABLET | ORAL | 2 refills | Status: DC

## 2019-03-26 NOTE — Telephone Encounter (Signed)
LMTCB  Per pharmacy they sent refill request and it was denied stating the patient needed a OV. She has been seen within a year per epic.   Lunesta 2mg  1 tab po daily prn # 30 Last filled 02/21/2019 #30.  Last OV: 11/14/2018.   Wyn Quaker please advise. Order has been pended. Verify refills and amount if okay to refill. Thanks.

## 2019-03-26 NOTE — Telephone Encounter (Signed)
[place] PMP has been reviewed.  Med refills have been sent.  I do believe the patient needs a follow-up office visit in the next 4 to 8 weeks with an APP.  Wyn Quaker FNP

## 2019-03-26 NOTE — Telephone Encounter (Signed)
Can this be pended to me as a med request? I can revew from there.  Also patient should have follow up as she is due for follow up. Schedule with any APP over next 4 weeks.  Wyn Quaker FNP

## 2019-03-26 NOTE — Telephone Encounter (Signed)
Ok great thank you  Aaron Edelman

## 2019-03-26 NOTE — Telephone Encounter (Signed)
Spoke with the pt  She is using contraceptive options and is not pregnant  She is scheduled with Tonya for 05/13/19 at 9 am  Pt aware med was refilled

## 2019-03-26 NOTE — Telephone Encounter (Signed)
Will send as med request

## 2019-03-26 NOTE — Telephone Encounter (Signed)
Need to confirm that patient is not pregnant, and using contraceptive options.  Wyn Quaker FNP

## 2019-03-28 ENCOUNTER — Other Ambulatory Visit (INDEPENDENT_AMBULATORY_CARE_PROVIDER_SITE_OTHER): Payer: 59

## 2019-03-28 ENCOUNTER — Other Ambulatory Visit: Payer: Self-pay

## 2019-03-28 DIAGNOSIS — E785 Hyperlipidemia, unspecified: Secondary | ICD-10-CM

## 2019-03-28 DIAGNOSIS — R739 Hyperglycemia, unspecified: Secondary | ICD-10-CM

## 2019-03-28 DIAGNOSIS — Z79899 Other long term (current) drug therapy: Secondary | ICD-10-CM

## 2019-03-28 LAB — BASIC METABOLIC PANEL
BUN: 14 mg/dL (ref 6–23)
CO2: 28 mEq/L (ref 19–32)
Calcium: 9.8 mg/dL (ref 8.4–10.5)
Chloride: 101 mEq/L (ref 96–112)
Creatinine, Ser: 1.11 mg/dL (ref 0.40–1.20)
GFR: 54.03 mL/min — ABNORMAL LOW (ref >60.00)
Glucose, Bld: 102 mg/dL — ABNORMAL HIGH (ref 70–99)
Potassium: 4.3 mEq/L (ref 3.5–5.1)
Sodium: 138 mEq/L (ref 135–145)

## 2019-03-28 LAB — LIPID PANEL
Cholesterol: 283 mg/dL — ABNORMAL HIGH (ref 0–200)
HDL: 66.4 mg/dL (ref >39.00)
NonHDL: 216.19
Total CHOL/HDL Ratio: 4
Triglycerides: 221 mg/dL — ABNORMAL HIGH (ref 0.0–149.0)
VLDL: 44.2 mg/dL — ABNORMAL HIGH (ref 0.0–40.0)

## 2019-03-28 LAB — HEMOGLOBIN A1C: Hgb A1c MFr Bld: 6 % (ref 4.6–6.5)

## 2019-03-28 LAB — LDL CHOLESTEROL, DIRECT: Direct LDL: 183 mg/dL

## 2019-04-14 ENCOUNTER — Other Ambulatory Visit: Payer: Self-pay | Admitting: Internal Medicine

## 2019-04-15 ENCOUNTER — Other Ambulatory Visit: Payer: Self-pay | Admitting: Internal Medicine

## 2019-05-05 ENCOUNTER — Other Ambulatory Visit: Payer: Self-pay | Admitting: Internal Medicine

## 2019-05-13 ENCOUNTER — Ambulatory Visit: Payer: 59 | Admitting: Nurse Practitioner

## 2019-05-14 ENCOUNTER — Other Ambulatory Visit (HOSPITAL_BASED_OUTPATIENT_CLINIC_OR_DEPARTMENT_OTHER): Payer: Self-pay

## 2019-05-14 ENCOUNTER — Other Ambulatory Visit: Payer: Self-pay

## 2019-05-14 ENCOUNTER — Encounter: Payer: Self-pay | Admitting: Nurse Practitioner

## 2019-05-14 ENCOUNTER — Ambulatory Visit: Payer: 59 | Admitting: Nurse Practitioner

## 2019-05-14 VITALS — BP 126/72 | HR 85 | Temp 97.9°F | Ht 67.0 in | Wt 201.6 lb

## 2019-05-14 DIAGNOSIS — R400 Somnolence: Secondary | ICD-10-CM

## 2019-05-14 DIAGNOSIS — F5104 Psychophysiologic insomnia: Secondary | ICD-10-CM | POA: Diagnosis not present

## 2019-05-14 NOTE — Assessment & Plan Note (Signed)
Patient states that both lunesta and desyrel together give her the best relief.  Patient does still complain of nonrestorative sleep.  It was discussed at her last visit that if this was the case she would be ordered an in lab sleep study.  Patient Instructions  Will order in-lab sleep study Continue Lunesta and Desyrel as ordered    Follow up: Follow up with Dr. Ander Slade in 3 months or sooner if needed

## 2019-05-14 NOTE — Patient Instructions (Addendum)
Will order in-lab sleep study Continue Lunesta and Desyrel as ordered    Follow up: Follow up with Dr. Ander Slade in 3 months or sooner if needed

## 2019-05-14 NOTE — Progress Notes (Signed)
@Patient  ID: Sandra Hudson, female    DOB: January 02, 1976, 43 y.o.   MRN: [id number]  Chief Complaint  Patient presents with  . Other    Psychophysiological Insomnia    Referring provider: Burnis Medin, MD  HPI 43 year old female with insomnia who is followed by Dr. Ander Slade.  Tests: HST 07/31/18 - AHI 3.3, SpO2 89%   OV 05/14/19 - Follow up Patient presents for a follow-up visit today.  She states that this is been a stable interval for her.  She was last seen by Dr. Ander Slade on 11/14/2018.  At her last visit she was taking Lunesta and Desyrel and was advised to stop 1 agent at a time to see if 1 of the agents by itself would help.  She states that she did try this but states that both agents together give her the best relief.  Patient does still complain of nonrestorative sleep.  It was discussed at her last visit that if this was the case she would be ordered an in lab sleep study. Denies f/c/s, n/v/d, hemoptysis, PND, leg swelling.     No Known Allergies  Immunization History  Administered Date(s) Administered  . Influenza Split 08/22/2012, 07/24/2013  . Influenza Whole 09/14/2009  . Influenza,inj,Quad PF,6+ Mos 08/06/2014, 08/21/2015, 07/07/2017, 09/11/2018  . Influenza,inj,quad, With Preservative 08/18/2015  . Influenza-Unspecified 07/07/2017  . Td 07/16/2008  . Tdap 02/01/2014    Past Medical History:  Diagnosis Date  . Allergic rhinitis due to dust   . Allergy   . Breech presentation 01/29/2014  . Female infertility   . Headache(784.0)   . Pre-eclampsia 01/29/2014  . Pregnancy induced hypertension   . Status post primary low transverse cesarean section 02/01/2014    Tobacco History: Social History   Tobacco Use  Smoking Status Never Smoker  Smokeless Tobacco Never Used   Counseling given: Yes   Outpatient Encounter Medications as of 05/14/2019  Medication Sig  . Biotin (BIOTIN 5000) 5 MG CAPS Take 1 capsule by mouth daily.  Marland Kitchen buPROPion (WELLBUTRIN XL) 300  MG 24 hr tablet TAKE 1 TABLET BY MOUTH EVERY MORNING  . cetirizine (ZYRTEC) 10 MG tablet Take 10 mg by mouth daily.  . Cholecalciferol (VITAMIN D3) 2000 units TABS Take 1 tablet by mouth daily.  . DULoxetine (CYMBALTA) 60 MG capsule TAKE 1 CAPSULE BY MOUTH EVERY DAY  . eszopiclone (LUNESTA) 2 MG TABS tablet TAKE 1 TABLET BY MOUTH AT BEDTIME AS NEEDED FOR SLEEP,TAKE IMMEDIATELY BEFORE BEDTIME  . FALMINA 0.1-20 MG-MCG tablet Take 1 tablet by mouth daily.  . metFORMIN (GLUCOPHAGE) 850 MG tablet TAKE 1 TABLET BY MOUTH TWICE DAILY  . OVER THE COUNTER MEDICATION [facility] OTC Sleep Aid  . spironolactone (ALDACTONE) 100 MG tablet Take 100 mg by mouth 2 (two) times daily.  . SUMAtriptan (IMITREX) 100 MG tablet TAKE 1 TABLET BY MOUTH IMMEDIATELY AND MAY REPEAT AFTER 2 HOURS  . traZODone (DESYREL) 50 MG tablet TAKE 1/2 TO 1 TABLET BY MOUTH AT BEDTIME AS DIRECTED  . zinc gluconate 50 MG tablet Take 50 mg by mouth daily.   No facility-administered encounter medications on file as of 05/14/2019.      Review of Systems  Review of Systems  Constitutional: Negative.  Negative for chills and fever.  HENT: Negative.   Respiratory: Negative for cough, shortness of breath and wheezing.   Cardiovascular: Negative.  Negative for chest pain, palpitations and leg swelling.  Gastrointestinal: Negative.   Allergic/Immunologic: Negative.   Neurological: Negative.  Psychiatric/Behavioral: Negative.        Physical Exam  BP 126/72 (BP Location: Left Arm, Patient Position: Sitting, Cuff Size: Normal)   Pulse 85   Temp 97.9 F (36.6 C)   Ht 5\' 7"  (1.702 m)   Wt 201 lb 9.6 oz (91.4 kg)   SpO2 96%   BMI 31.58 kg/m   Wt Readings from Last 5 Encounters:  05/14/19 201 lb 9.6 oz (91.4 kg)  11/14/18 209 lb (94.8 kg)  11/05/18 207 lb 14.4 oz (94.3 kg)  09/11/18 207 lb 12.8 oz (94.3 kg)  08/14/18 202 lb (91.6 kg)     Physical Exam Vitals signs and nursing note reviewed.  Constitutional:      General:  She is not in acute distress.    Appearance: She is well-developed.  Cardiovascular:     Rate and Rhythm: Normal rate and regular rhythm.  Pulmonary:     Effort: Pulmonary effort is normal. No respiratory distress.     Breath sounds: Normal breath sounds. No wheezing or rhonchi.  Musculoskeletal:        General: No swelling.  Neurological:     Mental Status: She is alert and oriented to person, place, and time.       Assessment & Plan:   Insomnia Patient states that both lunesta and desyrel together give her the best relief.  Patient does still complain of nonrestorative sleep.  It was discussed at her last visit that if this was the case she would be ordered an in lab sleep study.  Patient Instructions  Will order in-lab sleep study Continue Lunesta and Desyrel as ordered    Follow up: Follow up with Dr. Wynona Neatlalere in 3 months or sooner if needed       Ivonne Andrewonya S Lavergne Hiltunen, NP 05/14/2019

## 2019-05-24 ENCOUNTER — Other Ambulatory Visit: Payer: Self-pay

## 2019-05-24 DIAGNOSIS — R400 Somnolence: Secondary | ICD-10-CM

## 2019-05-27 ENCOUNTER — Other Ambulatory Visit (HOSPITAL_COMMUNITY)
Admission: RE | Admit: 2019-05-27 | Discharge: 2019-05-27 | Disposition: A | Discharge status: Home / Self Care | Payer: 59 | Source: Ambulatory Visit | Attending: Pulmonary Disease | Admitting: Pulmonary Disease

## 2019-05-27 ENCOUNTER — Other Ambulatory Visit: Payer: Self-pay

## 2019-05-27 ENCOUNTER — Other Ambulatory Visit (HOSPITAL_COMMUNITY): Payer: 59

## 2019-06-11 ENCOUNTER — Other Ambulatory Visit: Payer: Self-pay | Admitting: Internal Medicine

## 2019-06-13 ENCOUNTER — Other Ambulatory Visit: Payer: Self-pay | Admitting: Internal Medicine

## 2019-06-19 ENCOUNTER — Ambulatory Visit: Payer: 59

## 2019-06-19 ENCOUNTER — Other Ambulatory Visit: Payer: Self-pay

## 2019-06-19 DIAGNOSIS — G4710 Hypersomnia, unspecified: Secondary | ICD-10-CM | POA: Diagnosis not present

## 2019-06-19 DIAGNOSIS — R400 Somnolence: Secondary | ICD-10-CM

## 2019-06-20 ENCOUNTER — Telehealth: Payer: Self-pay

## 2019-06-20 DIAGNOSIS — G4710 Hypersomnia, unspecified: Secondary | ICD-10-CM | POA: Diagnosis not present

## 2019-06-20 NOTE — Telephone Encounter (Signed)
I called and spoke with the patient and made her aware of her results. I explained to her that she did not meet the threshold for OSA and gave her the recommendations to help her with her sleep. She verbalized understanding and she requested a follow up so that she can speak with the doctor about other options. Nothing further is needed at this time.

## 2019-06-27 ENCOUNTER — Other Ambulatory Visit: Payer: Self-pay | Admitting: Pulmonary Disease

## 2019-06-27 DIAGNOSIS — G4700 Insomnia, unspecified: Secondary | ICD-10-CM

## 2019-06-27 DIAGNOSIS — G4701 Insomnia due to medical condition: Secondary | ICD-10-CM

## 2019-06-28 NOTE — Telephone Encounter (Signed)
Appropriate for refill

## 2019-06-28 NOTE — Telephone Encounter (Signed)
06/28/2019 1649  Refill sent.   Pt needs follow up with Dr. Ander Slade in 4-6 weeks.   Wyn Quaker FNP

## 2019-07-08 ENCOUNTER — Other Ambulatory Visit: Payer: Self-pay | Admitting: Internal Medicine

## 2019-07-24 ENCOUNTER — Other Ambulatory Visit: Payer: Self-pay

## 2019-07-24 ENCOUNTER — Ambulatory Visit: Payer: 59 | Admitting: Pulmonary Disease

## 2019-07-24 ENCOUNTER — Encounter: Payer: Self-pay | Admitting: Pulmonary Disease

## 2019-07-24 VITALS — BP 124/72 | HR 83 | Ht 67.5 in | Wt 202.4 lb

## 2019-07-24 DIAGNOSIS — R5383 Other fatigue: Secondary | ICD-10-CM

## 2019-07-24 NOTE — Patient Instructions (Signed)
Daytime fatigue Insomnia  Continue current medications Graded exercises as discussed  We will see you in about 8 weeks, if symptoms are unchanged, we may consider a repeat sleep study  Call with significant concerns

## 2019-07-24 NOTE — Progress Notes (Signed)
Sandra Hudson    [id number]    [birth year]  Primary Care Physician:Panosh, Standley Brooking, MD  Referring Physician: Burnis Medin, MD [place],  [place] [address]  Chief complaint:   Patient with daytime sleepiness Sleep onset and sleep maintenance insomnia Recent sleep study negative for significant sleep disordered breathing, did support insomnia  HPI: Has had difficulty sleeping since school age Difficulty falling asleep, difficulty maintaining sleep Has tried multiple sleep aids in the past, has used [place], Ambien  She is finally using Lunesta in combination with trazodone-improvement in symptoms She still wakes up a couple of times during the night-able to usually go back to sleep -She wonders if she may be able to transition to just use on one agent which we discussed today -She still feels she wakes up in the morning sometimes feeling like she did not have restorative sleep Better with current treatment  She has not been very active especially with the COVID restrictions There may be a component of deconditioning  Usually goes to bed about 9-11 PM, may take about an hour to fall asleep-this has not changed recently Wakes up about 2-3 times during the night Finally gets out of bed at about 8 to 8:30 in the morning, spouse wakes up about 530-so she is in and out of sleep between 530 and 830 Has significant sleep inertia She does have some daytime sleepiness but does not take naps No significant night sweats She moves around the bed during sleep but does not have any symptoms suggesting restless legs History of nasal surgery for deviated septum  History of depression  Her youngest child sleeps in bed with her at present-she does not think this influences her sleep any significantly  Non-smoker  Outpatient Encounter Medications as of 07/24/2019  Medication Sig  . Biotin (BIOTIN 5000) 5 MG CAPS Take 1 capsule by mouth daily.  Marland Kitchen buPROPion  (WELLBUTRIN XL) 300 MG 24 hr tablet TAKE 1 TABLET BY MOUTH EVERY MORNING  . cetirizine (ZYRTEC) 10 MG tablet Take 10 mg by mouth daily.  . Cholecalciferol (VITAMIN D3) 2000 units TABS Take 1 tablet by mouth daily.  . DULoxetine (CYMBALTA) 60 MG capsule TAKE 1 CAPSULE BY MOUTH EVERY DAY  . eszopiclone (LUNESTA) 2 MG TABS tablet TAKE 1 TABLET BY MOUTH AT BEDTIME AS NEEDED FOR SLEEP. TAKE IMMEDIATELY BEFORE BEDTIME  . FALMINA 0.1-20 MG-MCG tablet Take 1 tablet by mouth daily.  . metFORMIN (GLUCOPHAGE) 850 MG tablet TAKE 1 TABLET BY MOUTH TWICE DAILY  . OVER THE COUNTER MEDICATION [facility] [facility] OTC Sleep Aid  . spironolactone (ALDACTONE) 100 MG tablet Take 100 mg by mouth 2 (two) times daily.  . SUMAtriptan (IMITREX) 100 MG tablet TAKE 1 TABLET BY MOUTH IMMEDIATELY AND MAY REPEAT AFTER 2 HOURS  . traZODone (DESYREL) 50 MG tablet TAKE 1/2 TO 1 TABLET BY MOUTH AT BEDTIME AS DIRECTED  . zinc gluconate 50 MG tablet Take 50 mg by mouth daily.   No facility-administered encounter medications on file as of 07/24/2019.     Allergies as of 07/24/2019  . (No Known Allergies)    Past Medical History:  Diagnosis Date  . Allergic rhinitis due to dust   . Allergy   . Breech presentation 01/29/2014  . Female infertility   . Headache(784.0)   . Pre-eclampsia 01/29/2014  . Pregnancy induced hypertension   . Status post primary low transverse cesarean section 02/01/2014    Past  Surgical History:  Procedure Laterality Date  . CESAREAN SECTION N/A 01/31/2014   Procedure: CESAREAN SECTION;  Surgeon: Purcell Nails, MD;  Location: [facility];  Service: Obstetrics;  Laterality: N/A;  . NASAL SEPTUM SURGERY     deviated septum and large turbinate    Family History  Problem Relation Age of Onset  . Depression Father   . Depression Mother   . Breast cancer Unknown        maternal great aunt  . Breast cancer Paternal Grandmother   . ALS Father        multiple system atropy    Social History   Socioeconomic  History  . Marital status: Married    Spouse name: Not on file  . Number of children: 1  . Years of education: Not on file  . Highest education level: Not on file  Occupational History    Employer: UNEMPLOYED  Social Needs  . Financial resource strain: Not on file  . Food insecurity    Worry: Not on file    Inability: Not on file  . Transportation needs    Medical: Not on file    Non-medical: Not on file  Tobacco Use  . Smoking status: Never Smoker  . Smokeless tobacco: Never Used  Substance and Sexual Activity  . Alcohol use: No    Comment: occasionally  . Drug use: No  . Sexual activity: Not on file  Lifestyle  . Physical activity    Days per week: Not on file    Minutes per session: Not on file  . Stress: Not on file  Relationships  . Social Musician on phone: Not on file    Gets together: Not on file    Attends religious service: Not on file    Active member of club or organization: Not on file    Attends meetings of clubs or organizations: Not on file    Relationship status: Not on file  . Intimate partner violence    Fear of current or ex partner: Not on file    Emotionally abused: Not on file    Physically abused: Not on file    Forced sexual activity: Not on file  Other Topics Concern  . 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    Review of Systems  Constitutional: Positive for fatigue.  Respiratory: Negative.   Cardiovascular: Negative.   Gastrointestinal: Negative.   Psychiatric/Behavioral: Positive for sleep disturbance.  All other systems reviewed and are negative.   Vitals:   07/24/19 1158  BP: 124/72  Pulse: 83  SpO2: 98%     Physical Exam  Constitutional: She appears well-developed and well-nourished.  HENT:  Head: Normocephalic and atraumatic.  Eyes: Pupils are equal, round, and reactive to light. Conjunctivae and EOM  are normal. Right eye exhibits no discharge.  Neck: Normal range of motion. Neck supple. No tracheal deviation present. No thyromegaly present.  Cardiovascular: Normal rate and regular rhythm.  Pulmonary/Chest: Effort normal and breath sounds normal. No respiratory distress. She has no wheezes.  Abdominal: Soft. Bowel sounds are normal. She exhibits no distension. There is no abdominal tenderness. There is no rebound.   Epworth Sleepiness Scale of 6   Assessment:   Sleep onset and sleep maintenance insomnia -Recent sleep  study did rule out significant sleep disordered breathing, did support a diagnosis of insomnia -Quality of sleep is better with trazodone and Lunesta  Nonrestorative sleep  Insomnia -currently on Lunesta and trazodone-both seem to be helping -The combination helps  Mood Disorder -Generally better   Plan  Multiple factors likely contributing to insomnia, nonrestorative sleep -Lunesta and trazodone helping at present  Optimization of medications for depression-may also help her insomnia  Continue other lines of care  I will see her back in the office in about 3 months Repeat polysomnogram may be considered if despite increase activity and graded exercises, she does not feel better during the day   Virl DiamondAdewale Chyane Greer MD [facility] 07/24/2019, 12:35 PM  CC: Panosh, Neta [name], MD

## 2019-07-29 ENCOUNTER — Other Ambulatory Visit: Payer: Self-pay | Admitting: Pulmonary Disease

## 2019-07-29 DIAGNOSIS — G4701 Insomnia due to medical condition: Secondary | ICD-10-CM

## 2019-07-29 DIAGNOSIS — G4700 Insomnia, unspecified: Secondary | ICD-10-CM

## 2019-07-29 NOTE — Telephone Encounter (Signed)
Received a RX refill request from patient's pharmacy. Patient is requesting a refill on her Lunesta 2mg .   Last OV: 07/24/19 with AO Next OV: 09/18/19 with AO Last RX: 06/28/19 for 30 tabs, 0 refills.   Pharmacy is [facility] in [place].   AO, please advise if you are ok with this refill. I have pended the order for you. Thanks!

## 2019-08-02 ENCOUNTER — Other Ambulatory Visit: Payer: Self-pay | Admitting: Internal Medicine

## 2019-08-31 ENCOUNTER — Other Ambulatory Visit: Payer: Self-pay | Admitting: Internal Medicine

## 2019-09-02 ENCOUNTER — Other Ambulatory Visit: Payer: Self-pay | Admitting: Pulmonary Disease

## 2019-09-02 DIAGNOSIS — G4701 Insomnia due to medical condition: Secondary | ICD-10-CM

## 2019-09-02 DIAGNOSIS — G4700 Insomnia, unspecified: Secondary | ICD-10-CM

## 2019-09-02 NOTE — Telephone Encounter (Signed)
AO please advise on refill for   Assessment:   Sleep onset and sleep maintenance insomnia -Recent sleep study did rule out significant sleep disordered breathing, did support a diagnosis of insomnia -Quality of sleep is better with trazodone and Lunesta  Nonrestorative sleep  Insomnia -currently on Lunesta and trazodone-both seem to be helping -The combination helps  Mood Disorder -Generally better

## 2019-09-09 DIAGNOSIS — Z23 Encounter for immunization: Secondary | ICD-10-CM | POA: Diagnosis not present

## 2019-09-11 ENCOUNTER — Telehealth (INDEPENDENT_AMBULATORY_CARE_PROVIDER_SITE_OTHER): Payer: BC Managed Care – PPO | Admitting: Internal Medicine

## 2019-09-11 ENCOUNTER — Encounter: Payer: Self-pay | Admitting: Physician Assistant

## 2019-09-11 ENCOUNTER — Other Ambulatory Visit: Payer: Self-pay

## 2019-09-11 ENCOUNTER — Encounter: Payer: Self-pay | Admitting: Internal Medicine

## 2019-09-11 DIAGNOSIS — Z79899 Other long term (current) drug therapy: Secondary | ICD-10-CM | POA: Diagnosis not present

## 2019-09-11 DIAGNOSIS — F4321 Adjustment disorder with depressed mood: Secondary | ICD-10-CM

## 2019-09-11 DIAGNOSIS — G4700 Insomnia, unspecified: Secondary | ICD-10-CM

## 2019-09-11 DIAGNOSIS — R198 Other specified symptoms and signs involving the digestive system and abdomen: Secondary | ICD-10-CM | POA: Diagnosis not present

## 2019-09-11 MED ORDER — DULOXETINE HCL 30 MG PO CPEP: 30.0000 mg | ORAL_CAPSULE | Freq: Every day | ORAL | 3 refills | Status: DC

## 2019-09-11 NOTE — Progress Notes (Signed)
Virtual Visit via Video Note  I connected with@ on 09/11/19 at 10:00 AM EST by a video enabled telemedicine application and verified that I am speaking with the correct person using two identifiers. Location patient: home Location provider:work office Persons participating in the virtual visit: patient, provider  [place] national recommendations  regarding COVID 19 pandemic   video visit is advised over in office visit for this patient.  Patient aware  of the limitations of evaluation and management by telemedicine and  availability of in person appointments. and agreed to proceed.   HPI: Sandra Hudson presents for video visit for couple of things 1.  She is having episodes of increased mucus in her stool has hemorrhoids but no bleeding at this time wonders if she should have an updated colonoscopy.  Had some back pain with her periods wonders if it was bowel related. 2.  Needs some help with mood either medication change or other she finds many days with lack of motivation looks at the tasks she needs to accomplish and does nothing.  Other days are better she is at home with her 2 school-aged children her youngest daughter has had fecal soiling relapse and is struggling with that over the last 3 weeks she feels that her house and cleaning organizing has fallen aside and feels overwhelmed or lack of motivation at times She does not look forward to things she likes to do such as pottery on a given day. She is taking Lunesta 2 mg and trazodone 50 mg at night to sleep that seems to help her sleep.  Denies any major hangover.  If she tries to stop one or the other her sleep is not as good.  She is taking Wellbutrin 300 mg and do Cymbalta 60 mg. She has not gotten to finding a Company secretary.  Not suicidal does get frustrated and overwhelmed with tasks at hand.  Has some fear that her children are going to not pass their current academic status.  She is a Press photographer now homemaker.     ROS: See  pertinent positives and negatives per HPI.  Past Medical History:  Diagnosis Date  . Allergic rhinitis due to dust   . Allergy   . Breech presentation 01/29/2014  . Female infertility   . Headache(784.0)   . Pre-eclampsia 01/29/2014  . Pregnancy induced hypertension   . Status post primary low transverse cesarean section 02/01/2014    Past Surgical History:  Procedure Laterality Date  . CESAREAN SECTION N/A 01/31/2014   Procedure: CESAREAN SECTION;  Surgeon: Delice Lesch, MD;  Location: [facility];  Service: Obstetrics;  Laterality: N/A;  . NASAL SEPTUM SURGERY     deviated septum and large turbinate    Family History  Problem Relation Age of Onset  . Depression Father   . ALS Father        multiple system atropy  . Depression Mother   . Breast cancer Other        maternal great aunt  . Breast cancer Paternal Grandmother     Social History   Tobacco Use  . Smoking status: Never Smoker  . Smokeless tobacco: Never Used  Substance Use Topics  . Alcohol use: No    Comment: occasionally  . Drug use: No      Current Outpatient Medications:  .  Biotin (BIOTIN 5000) 5 MG CAPS, Take 1 capsule by mouth daily., Disp: , Rfl:  .  buPROPion (WELLBUTRIN XL) 300 MG 24 hr tablet,  TAKE 1 TABLET BY MOUTH EVERY MORNING, Disp: 30 tablet, Rfl: 2 .  cetirizine (ZYRTEC) 10 MG tablet, Take 10 mg by mouth daily., Disp: , Rfl:  .  Cholecalciferol (VITAMIN D3) 2000 units TABS, Take 1 tablet by mouth daily., Disp: , Rfl:  .  DULoxetine (CYMBALTA) 30 MG capsule, Take 1 capsule (30 mg total) by mouth daily. With the 60 mg ( total 90 mg per day), Disp: 30 capsule, Rfl: 3 .  DULoxetine (CYMBALTA) 60 MG capsule, TAKE 1 CAPSULE BY MOUTH EVERY DAY, Disp: 90 capsule, Rfl: 0 .  eszopiclone (LUNESTA) 2 MG TABS tablet, TAKE 1 TABLET BY MOUTH EVERY NIGHT AT BEDTIME AS NEEDED FOR SLEEP** TAKE IMMEDIATELY BEFORE BEDTIME, Disp: 30 tablet, Rfl: 3 .  FALMINA 0.1-20 MG-MCG tablet, Take 1 tablet by mouth daily.,  Disp: , Rfl: 10 .  metFORMIN (GLUCOPHAGE) 850 MG tablet, TAKE 1 TABLET BY MOUTH TWICE DAILY, Disp: 60 tablet, Rfl: 5 .  OVER THE COUNTER MEDICATION, [employer] OTC Sleep Aid, Disp: , Rfl:  .  spironolactone (ALDACTONE) 100 MG tablet, Take 100 mg by mouth 2 (two) times daily., Disp: , Rfl:  .  SUMAtriptan (IMITREX) 100 MG tablet, TAKE 1 TABLET BY MOUTH IMMEDIATELY AND MAY REPEAT AFTER 2 HOURS, Disp: 27 tablet, Rfl: 0 .  traZODone (DESYREL) 50 MG tablet, TAKE 1/2 TO 1 TABLET BY MOUTH AT BEDTIME AS DIRECTED, Disp: 30 tablet, Rfl: 3 .  zinc gluconate 50 MG tablet, Take 50 mg by mouth daily., Disp: , Rfl:   EXAM: BP Readings from Last 3 Encounters:  07/24/19 124/72  05/14/19 126/72  11/14/18 118/62    VITALS per patient if applicable:  GENERAL: alert, oriented, appears well and in no acute distress  HEENT: atraumatic, conjunttiva clear, no obvious abnormalities on inspection of external nose and ears  NECK: normal movements of the head and neck LUNGS: on inspection no signs of respiratory distress, breathing rate appears normal, no obvious gross SOB, gasping or wheezing CV: no obvious cyanosis  MS: moves all visible extremities without noticeable abnormality  PSYCH/NEURO: pleasant and cooperative,  speech and thought processing emotional at times but normal thought process and conversation.  Grossly intact Lab Results  Component Value Date   WBC 5.8 11/05/2018   HGB 13.0 11/05/2018   HCT 39.6 11/05/2018   PLT 358.0 11/05/2018   GLUCOSE 102 (H) 03/28/2019   CHOL 283 (H) 03/28/2019   TRIG 221.0 (H) 03/28/2019   HDL 66.40 03/28/2019   LDLDIRECT 183.0 03/28/2019   LDLCALC 160 (H) 11/03/2017   ALT 13 11/05/2018   AST 15 11/05/2018   NA 138 03/28/2019   K 4.3 03/28/2019   CL 101 03/28/2019   CREATININE 1.11 03/28/2019   BUN 14 03/28/2019   CO2 28 03/28/2019   TSH 3.54 11/05/2018   HGBA1C 6.0 03/28/2019   MICROALBUR 1.1 09/15/2010    ASSESSMENT AND PLAN:  Discussed the  following assessment and plan:    ICD-10-CM   1. Medication management  Z79.899   2. Adjustment disorder with depressed mood  F43.21    see text  inc cymbalta to 90 mg per day  rov vv in 1 month  3. Insomnia, unspecified type  G47.00   4. Change in bowel movement  R19.8 Ambulatory referral to Gastroenterology   Disc daughters soiling backsliding probably adding to the difficulties. 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 Gi referral about sx and ?  Possible change bowel habits update colonscopy?  Counseled.   Expectant management and discussion of plan and treatment with opportunity to ask questions and all were answered. The patient agreed with the plan and demonstrated an understanding of the instructions.   Advised to call back or seek an in-person evaluation if worsening  or having  further concerns .  In the interim. Total visit 30 mins > 50% spent counseling and coordinating care as indicated in above note and in instructions to patient .     Return in about 1 month (around 10/11/2019) for virtual. or earlier if needed    Berniece AndreasWanda Teddy Rebstock, MD

## 2019-09-18 ENCOUNTER — Ambulatory Visit: Payer: 59 | Admitting: Pulmonary Disease

## 2019-09-29 ENCOUNTER — Other Ambulatory Visit: Payer: Self-pay | Admitting: Internal Medicine

## 2019-10-04 ENCOUNTER — Ambulatory Visit: Payer: Self-pay | Admitting: Physician Assistant

## 2019-10-06 ENCOUNTER — Other Ambulatory Visit: Payer: Self-pay | Admitting: Internal Medicine

## 2019-10-16 ENCOUNTER — Other Ambulatory Visit: Payer: Self-pay

## 2019-10-16 ENCOUNTER — Ambulatory Visit (INDEPENDENT_AMBULATORY_CARE_PROVIDER_SITE_OTHER): Payer: BC Managed Care – PPO | Admitting: Pulmonary Disease

## 2019-10-16 ENCOUNTER — Other Ambulatory Visit: Payer: Self-pay | Admitting: Internal Medicine

## 2019-10-16 ENCOUNTER — Encounter: Payer: Self-pay | Admitting: Pulmonary Disease

## 2019-10-16 DIAGNOSIS — G4700 Insomnia, unspecified: Secondary | ICD-10-CM

## 2019-10-16 DIAGNOSIS — G4701 Insomnia due to medical condition: Secondary | ICD-10-CM

## 2019-10-16 DIAGNOSIS — F339 Major depressive disorder, recurrent, unspecified: Secondary | ICD-10-CM

## 2019-10-16 DIAGNOSIS — R5383 Other fatigue: Secondary | ICD-10-CM | POA: Diagnosis not present

## 2019-10-16 NOTE — Patient Instructions (Signed)
Insomnia-stable with current medications Depression-improved control with medication  Encourage increase physical activity as tolerated  Follow-up in 3 months

## 2019-10-16 NOTE — Progress Notes (Signed)
Virtual Visit via Telephone Note  I connected with Sandra Hudson on 10/16/19 at 11:45 AM EST by telephone and verified that I am speaking with the correct person using two identifiers.  Location: Patient: Sandra Hudson Provider: Adrian Prince discussed the limitations, risks, security and privacy concerns of performing an evaluation and management service by telephone and the availability of in person appointments. I also discussed with the patient that there may be a patient responsible charge related to this service. The patient expressed understanding and agreed to proceed.   History of Present Illness: History of insomnia -Sleep quality is better/about the same with use of trazodone and Lunesta -Depression medications recently optimized and feels better with this -Attempts at increasing physical activity had to be aborted secondary to the weather not cooperating  -Feels generally well -Still feels tired during the day   Observations/Objective: Sounds well on the phone  Records reviewed  Assessment and Plan: Nonrestorative sleep, insomnia-improvement with sleep aids Depression-better controlled with medication optimization -We will suggest to continue current efforts  Follow Up Instructions: Encouraged to focus on increasing activity I will see her back in about 3 months Call with any significant concerns   I discussed the assessment and treatment plan with the patient. The patient was provided an opportunity to ask questions and all were answered. The patient agreed with the plan and demonstrated an understanding of the instructions.   The patient was advised to call back or seek an in-person evaluation if the symptoms worsen or if the condition fails to improve as anticipated.  I provided 12 minutes of non-face-to-face time during this encounter.   Laurin Coder, MD

## 2019-11-11 DIAGNOSIS — Z3040 Encounter for surveillance of contraceptives, unspecified: Secondary | ICD-10-CM | POA: Diagnosis not present

## 2019-11-11 DIAGNOSIS — Z01419 Encounter for gynecological examination (general) (routine) without abnormal findings: Secondary | ICD-10-CM | POA: Diagnosis not present

## 2019-11-11 DIAGNOSIS — Z1231 Encounter for screening mammogram for malignant neoplasm of breast: Secondary | ICD-10-CM | POA: Diagnosis not present

## 2019-11-11 DIAGNOSIS — Z6831 Body mass index (BMI) 31.0-31.9, adult: Secondary | ICD-10-CM | POA: Diagnosis not present

## 2019-11-18 ENCOUNTER — Other Ambulatory Visit: Payer: Self-pay | Admitting: Internal Medicine

## 2019-11-18 ENCOUNTER — Other Ambulatory Visit: Payer: Self-pay | Admitting: Obstetrics and Gynecology

## 2019-11-18 DIAGNOSIS — R928 Other abnormal and inconclusive findings on diagnostic imaging of breast: Secondary | ICD-10-CM

## 2019-11-22 ENCOUNTER — Ambulatory Visit
Admission: RE | Admit: 2019-11-22 | Discharge: 2019-11-22 | Disposition: A | Discharge status: Home / Self Care | Payer: BC Managed Care – PPO | Source: Ambulatory Visit | Attending: Obstetrics and Gynecology | Admitting: Obstetrics and Gynecology

## 2019-11-22 ENCOUNTER — Other Ambulatory Visit: Payer: Self-pay

## 2019-11-22 DIAGNOSIS — N6489 Other specified disorders of breast: Secondary | ICD-10-CM | POA: Diagnosis not present

## 2019-11-22 DIAGNOSIS — R928 Other abnormal and inconclusive findings on diagnostic imaging of breast: Secondary | ICD-10-CM

## 2019-11-26 ENCOUNTER — Other Ambulatory Visit: Payer: Self-pay | Admitting: Internal Medicine

## 2019-12-04 DIAGNOSIS — Z23 Encounter for immunization: Secondary | ICD-10-CM | POA: Diagnosis not present

## 2019-12-29 ENCOUNTER — Other Ambulatory Visit: Payer: Self-pay | Admitting: Internal Medicine

## 2019-12-30 NOTE — Telephone Encounter (Signed)
Patient need to schedule an ov for more refills. 

## 2020-01-01 DIAGNOSIS — Z23 Encounter for immunization: Secondary | ICD-10-CM | POA: Diagnosis not present

## 2020-01-03 ENCOUNTER — Other Ambulatory Visit: Payer: Self-pay

## 2020-01-03 ENCOUNTER — Encounter: Payer: Self-pay | Admitting: Internal Medicine

## 2020-01-03 ENCOUNTER — Telehealth (INDEPENDENT_AMBULATORY_CARE_PROVIDER_SITE_OTHER): Payer: BC Managed Care – PPO | Admitting: Internal Medicine

## 2020-01-03 VITALS — HR 88 | Ht 67.5 in | Wt 205.0 lb

## 2020-01-03 DIAGNOSIS — Z79899 Other long term (current) drug therapy: Secondary | ICD-10-CM

## 2020-01-03 DIAGNOSIS — E785 Hyperlipidemia, unspecified: Secondary | ICD-10-CM

## 2020-01-03 DIAGNOSIS — R739 Hyperglycemia, unspecified: Secondary | ICD-10-CM | POA: Diagnosis not present

## 2020-01-03 DIAGNOSIS — G4700 Insomnia, unspecified: Secondary | ICD-10-CM

## 2020-01-03 DIAGNOSIS — F4321 Adjustment disorder with depressed mood: Secondary | ICD-10-CM | POA: Diagnosis not present

## 2020-01-03 NOTE — Progress Notes (Signed)
Virtual Visit via Video Note  I connected with@ on 01/03/20 at  2:00 PM EDT by a video enabled telemedicine application and verified that I am speaking with the correct person using two identifiers. Location patient: home Location provider:work  office Persons participating in the virtual visit: patient, provider  [place] national recommendations  regarding COVID 19 pandemic   video visit is advised over in office visit for this patient.  Patient aware  of the limitations of evaluation and management by telemedicine and  availability of in person appointments. and agreed to proceed.   HPI: Sandra Hudson presents for video visit fu meds   A bit brighter out look but still  Off needs more time  Had covid vaccine and tired today  appt for daughter for counseling and educational evaluation. For her possibel dyslexia  School academic problems  Taking lunesta reg and helpful or prob wouldn't sleep at all  Is on Cymbalta 90 and wellbutrin at this time.  ROS: See pertinent positives and negatives per HPI.  Past Medical History:  Diagnosis Date  . Allergic rhinitis due to dust   . Allergy   . Breech presentation 01/29/2014  . Female infertility   . Headache(784.0)   . Pre-eclampsia 01/29/2014  . Pregnancy induced hypertension   . Status post primary low transverse cesarean section 02/01/2014    Past Surgical History:  Procedure Laterality Date  . CESAREAN SECTION N/A 01/31/2014   Procedure: CESAREAN SECTION;  Surgeon: Purcell Nails, MD;  Location: [facility];  Service: Obstetrics;  Laterality: N/A;  . NASAL SEPTUM SURGERY     deviated septum and large turbinate    Family History  Problem Relation Age of Onset  . Depression Father   . ALS Father        multiple system atropy  . Depression Mother   . Breast cancer Other        maternal great aunt  . Breast cancer Paternal Grandmother     Social History   Tobacco Use  . Smoking status: Never Smoker  . Smokeless tobacco: Never Used   Substance Use Topics  . Alcohol use: No    Comment: occasionally  . Drug use: No      Current Outpatient Medications:  .  Biotin (BIOTIN 5000) 5 MG CAPS, Take 1 capsule by mouth daily., Disp: , Rfl:  .  buPROPion (WELLBUTRIN XL) 300 MG 24 hr tablet, TAKE 1 TABLET BY MOUTH EVERY MORNING, Disp: 30 tablet, Rfl: 2 .  cetirizine (ZYRTEC) 10 MG tablet, Take 10 mg by mouth daily., Disp: , Rfl:  .  Cholecalciferol (VITAMIN D3) 2000 units TABS, Take 1 tablet by mouth daily., Disp: , Rfl:  .  DULoxetine (CYMBALTA) 30 MG capsule, Take 1 capsule (30 mg total) by mouth daily. With the 60 mg ( total 90 mg per day), Disp: 30 capsule, Rfl: 3 .  DULoxetine (CYMBALTA) 60 MG capsule, TAKE 1 CAPSULE BY MOUTH EVERY DAY, Disp: 90 capsule, Rfl: 0 .  eszopiclone (LUNESTA) 2 MG TABS tablet, TAKE 1 TABLET BY MOUTH EVERY NIGHT AT BEDTIME AS NEEDED FOR SLEEP** TAKE IMMEDIATELY BEFORE BEDTIME, Disp: 30 tablet, Rfl: 3 .  metFORMIN (GLUCOPHAGE) 850 MG tablet, TAKE 1 TABLET BY MOUTH TWICE DAILY, Disp: 60 tablet, Rfl: 5 .  MILI 0.25-35 MG-MCG tablet, , Disp: , Rfl:  .  spironolactone (ALDACTONE) 100 MG tablet, Take 100 mg by mouth 2 (two) times daily., Disp: , Rfl:  .  SUMAtriptan (IMITREX) 100 MG tablet,  TAKE 1 TABLET BY MOUTH IMMEDIATELY AND MAY REPEAT AFTER 2 HOURS, Disp: 27 tablet, Rfl: 0 .  traZODone (DESYREL) 50 MG tablet, TAKE 1/2 TO 1 TABLET BY MOUTH AT BEDTIME AS DIRECTED, Disp: 30 tablet, Rfl: 3 .  triamcinolone cream (KENALOG) 0.1 %, triamcinolone acetonide 0.1 % topical cream  APPLY EXTERNALLY TO THE AFFECTED AREA TWICE DAILY FOR 15 DAYS, Disp: , Rfl:  .  zinc gluconate 50 MG tablet, Take 50 mg by mouth daily., Disp: , Rfl:   EXAM: BP Readings from Last 3 Encounters:  07/24/19 124/72  05/14/19 126/72  11/14/18 118/62    VITALS per patient if applicable:  GENERAL: alert, oriented, appears well and in no acute distress  Looks well  Mostly  tired   HEENT: atraumatic, conjunttiva clear, no obvious  abnormalities on inspection of external nose and ears  NECK: normal movements of the head and neck  LUNGS: on inspection no signs of respiratory distress, breathing rate appears normal, no obvious gross SOB, gasping or wheezing  CV: no obvious cyanosis  MS: moves all visible extremities without noticeable abnormality  PSYCH/NEURO: pleasant and cooperative, no obvious depression or anxiety, speech and thought processing grossly intact Lab Results  Component Value Date   WBC 5.8 11/05/2018   HGB 13.0 11/05/2018   HCT 39.6 11/05/2018   PLT 358.0 11/05/2018   GLUCOSE 102 (H) 03/28/2019   CHOL 283 (H) 03/28/2019   TRIG 221.0 (H) 03/28/2019   HDL 66.40 03/28/2019   LDLDIRECT 183.0 03/28/2019   LDLCALC 160 (H) 11/03/2017   ALT 13 11/05/2018   AST 15 11/05/2018   NA 138 03/28/2019   K 4.3 03/28/2019   CL 101 03/28/2019   CREATININE 1.11 03/28/2019   BUN 14 03/28/2019   CO2 28 03/28/2019   TSH 3.54 11/05/2018   HGBA1C 6.0 03/28/2019   MICROALBUR 1.1 09/15/2010    ASSESSMENT AND PLAN:  Discussed the following assessment and plan:    ICD-10-CM   1. Adjustment disorder with depressed mood  F43.21   2. Insomnia, unspecified type  O03.55 Basic metabolic panel    CBC with Differential/Platelet    Hemoglobin A1c    Hepatic function panel    Lipid panel    TSH  3. Medication management  H74.163 Basic metabolic panel    CBC with Differential/Platelet    Hemoglobin A1c    Hepatic function panel    Lipid panel    TSH  4. Hyperglycemia  A45.3 Basic metabolic panel    CBC with Differential/Platelet    Hemoglobin A1c    Hepatic function panel    Lipid panel    TSH  5. Hyperlipidemia, unspecified hyperlipidemia type  M46.8 Basic metabolic panel    CBC with Differential/Platelet    Hemoglobin A1c    Hepatic function panel    Lipid panel    TSH   Continue   Getting her child evaluation and help may improve situation  Plan cpx in summer and labs  pre or at visit  Counseled.   Can change meds but counseling and externa l factors  Seem more important at this time  Expectant management and discussion of plan and treatment with opportunity to ask questions and all were answered. The patient agreed with the plan and demonstrated an understanding of the instructions.   Advised to call back or seek an in-person evaluation if worsening  or having  further concerns . Return for cpx with labs in summer 2021. and med check  Shanon Ace, MD

## 2020-01-08 ENCOUNTER — Other Ambulatory Visit: Payer: Self-pay | Admitting: Pulmonary Disease

## 2020-01-08 ENCOUNTER — Telehealth: Payer: Self-pay | Admitting: Pulmonary Disease

## 2020-01-08 DIAGNOSIS — G4701 Insomnia due to medical condition: Secondary | ICD-10-CM

## 2020-01-08 DIAGNOSIS — G4700 Insomnia, unspecified: Secondary | ICD-10-CM

## 2020-01-08 MED ORDER — ESZOPICLONE 2 MG PO TABS: ORAL_TABLET | ORAL | 3 refills | Status: DC

## 2020-01-08 NOTE — Telephone Encounter (Signed)
Pt calling requesting to have her lunesta filled. Dr. Val Eagle, please advise if you are okay refilling med for pt. Preferred pharmacy is [facility] in [place] off of [address].

## 2020-01-08 NOTE — Telephone Encounter (Signed)
Lunesta refilled

## 2020-01-08 NOTE — Progress Notes (Signed)
Lunesta refilled

## 2020-01-08 NOTE — Telephone Encounter (Signed)
Called and spoke with pt letting her know that AO refilled her lunesta and she verbalized understanding. Nothing further needed.

## 2020-01-18 ENCOUNTER — Other Ambulatory Visit: Payer: Self-pay | Admitting: Internal Medicine

## 2020-01-20 ENCOUNTER — Other Ambulatory Visit: Payer: Self-pay | Admitting: Internal Medicine

## 2020-01-28 ENCOUNTER — Other Ambulatory Visit: Payer: Self-pay | Admitting: Internal Medicine

## 2020-01-30 DIAGNOSIS — Z79899 Other long term (current) drug therapy: Secondary | ICD-10-CM | POA: Diagnosis not present

## 2020-01-30 DIAGNOSIS — L400 Psoriasis vulgaris: Secondary | ICD-10-CM | POA: Diagnosis not present

## 2020-01-30 DIAGNOSIS — L658 Other specified nonscarring hair loss: Secondary | ICD-10-CM | POA: Diagnosis not present

## 2020-02-17 ENCOUNTER — Other Ambulatory Visit: Payer: Self-pay | Admitting: Internal Medicine

## 2020-02-18 ENCOUNTER — Other Ambulatory Visit: Payer: Self-pay | Admitting: Internal Medicine

## 2020-04-16 ENCOUNTER — Other Ambulatory Visit: Payer: Self-pay | Admitting: Internal Medicine

## 2020-04-17 ENCOUNTER — Other Ambulatory Visit: Payer: Self-pay | Admitting: Internal Medicine

## 2020-05-13 ENCOUNTER — Other Ambulatory Visit: Payer: Self-pay | Admitting: Internal Medicine

## 2020-05-13 ENCOUNTER — Other Ambulatory Visit: Payer: Self-pay | Admitting: Pulmonary Disease

## 2020-05-13 DIAGNOSIS — G4701 Insomnia due to medical condition: Secondary | ICD-10-CM

## 2020-05-13 DIAGNOSIS — G4700 Insomnia, unspecified: Secondary | ICD-10-CM

## 2020-05-14 NOTE — Telephone Encounter (Signed)
Dr. O, please advise if you are okay refilling med. 

## 2020-05-15 ENCOUNTER — Other Ambulatory Visit: Payer: Self-pay | Admitting: Internal Medicine

## 2020-05-15 ENCOUNTER — Telehealth: Payer: Self-pay | Admitting: Pulmonary Disease

## 2020-05-15 NOTE — Telephone Encounter (Signed)
Tried to call pharmacy where AO sent pt's lunesta to on 05/14/20 and received an automated message stating that the pharmacy is temporarily closed today.  Called and spoke with pt stating to her that AO was not in the office today but I would send this to him in regards to her not able to get med from [facility] in [place] due to the pharmacy being temporarily closed today 7/30.  Dr. Val Eagle, please advise. Pt is unable to get her lunesta from [facility] in [place] due to the pharmacy being temporarily closed today 7/30. Pt is about to go out of town and is now requesting to have med go to [facility] in [place] off [address].

## 2020-05-15 NOTE — Telephone Encounter (Signed)
Pt's lunesta Rx was refilled 7/29 by AO #30 with 2 RF. Called and spoke with pt letting her know this had been done and she verbalized understanding. Nothing further needed.

## 2020-05-17 ENCOUNTER — Other Ambulatory Visit: Payer: Self-pay | Admitting: Internal Medicine

## 2020-05-17 NOTE — Telephone Encounter (Signed)
Ok to refill for 90 days  Arrange  In persons  cpx ov before  Next refill due

## 2020-05-21 NOTE — Telephone Encounter (Signed)
ATC, left VM x1.  Calling to see if she was able to get her Lunesta filled.

## 2020-05-22 NOTE — Telephone Encounter (Signed)
Spoke with pt who states being able to get Lunesta filled. Nothing further needed.

## 2020-05-28 NOTE — Telephone Encounter (Signed)
aware

## 2020-06-11 ENCOUNTER — Other Ambulatory Visit: Payer: Self-pay | Admitting: Internal Medicine

## 2020-06-23 ENCOUNTER — Other Ambulatory Visit: Payer: Self-pay | Admitting: Internal Medicine

## 2020-06-29 ENCOUNTER — Other Ambulatory Visit: Payer: Self-pay

## 2020-06-29 ENCOUNTER — Encounter: Payer: Self-pay | Admitting: Internal Medicine

## 2020-06-29 ENCOUNTER — Telehealth (INDEPENDENT_AMBULATORY_CARE_PROVIDER_SITE_OTHER): Payer: BC Managed Care – PPO | Admitting: Internal Medicine

## 2020-06-29 VITALS — Temp 98.4°F | Ht 67.5 in | Wt 208.0 lb

## 2020-06-29 DIAGNOSIS — Z79899 Other long term (current) drug therapy: Secondary | ICD-10-CM

## 2020-06-29 DIAGNOSIS — E785 Hyperlipidemia, unspecified: Secondary | ICD-10-CM | POA: Diagnosis not present

## 2020-06-29 DIAGNOSIS — Z0000 Encounter for general adult medical examination without abnormal findings: Secondary | ICD-10-CM

## 2020-06-29 DIAGNOSIS — Z8632 Personal history of gestational diabetes: Secondary | ICD-10-CM

## 2020-06-29 DIAGNOSIS — F5104 Psychophysiologic insomnia: Secondary | ICD-10-CM | POA: Diagnosis not present

## 2020-06-29 DIAGNOSIS — R739 Hyperglycemia, unspecified: Secondary | ICD-10-CM | POA: Diagnosis not present

## 2020-06-29 DIAGNOSIS — F4321 Adjustment disorder with depressed mood: Secondary | ICD-10-CM

## 2020-06-29 MED ORDER — TRAZODONE HCL 50 MG PO TABS
ORAL_TABLET | ORAL | 1 refills | Status: DC
Start: 2020-06-29 — End: 2020-09-17

## 2020-06-29 NOTE — Progress Notes (Signed)
Virtual Visit via Video Note  I connected with@ on 06/29/20 at  3:30 PM EDT by a video enabled telemedicine application and verified that I am speaking with the correct person using two identifiers. Location patient: home Location provider:work  office Persons participating in the virtual visit: patient, provider  [place] national recommendations  regarding COVID 19 pandemic   video visit is advised over in office visit for this patient.  Patient aware  of the limitations of evaluation and management by telemedicine and  availability of in person appointments. and agreed to proceed.   HPI: Sandra Hudson presents for video visit  For fu meds  And plan   telemed 11 20 and  3 20    Has seen dr Melvyn Neth for insomnia  Has been given lunesta exercise to  Help with breathing.     Trazodone  And lunesta  Helps for sleep.   cymbalta  90  And Wellbutrin seems to have pretty well   Taking metformin  Daughters  are now in person school and seems to have helped family dynamics and  Mood   ROS: See pertinent positives and negatives per HPI.  Past Medical History:  Diagnosis Date  . Allergic rhinitis due to dust   . Allergy   . Breech presentation 01/29/2014  . Female infertility   . Headache(784.0)   . Pre-eclampsia 01/29/2014  . Pregnancy induced hypertension   . Status post primary low transverse cesarean section 02/01/2014    Past Surgical History:  Procedure Laterality Date  . CESAREAN SECTION N/A 01/31/2014   Procedure: CESAREAN SECTION;  Surgeon: Purcell Nails, MD;  Location: [facility];  Service: Obstetrics;  Laterality: N/A;  . NASAL SEPTUM SURGERY     deviated septum and large turbinate    Family History  Problem Relation Age of Onset  . Depression Father   . ALS Father        multiple system atropy  . Depression Mother   . Breast cancer Other        maternal great aunt  . Breast cancer Paternal Grandmother     Social History   Tobacco Use  . Smoking status: Never  Smoker  . Smokeless tobacco: Never Used  Vaping Use  . Vaping Use: Never used  Substance Use Topics  . Alcohol use: No    Comment: occasionally  . Drug use: No      Current Outpatient Medications:  .  Biotin (BIOTIN 5000) 5 MG CAPS, Take 1 capsule by mouth daily., Disp: , Rfl:  .  buPROPion (WELLBUTRIN XL) 300 MG 24 hr tablet, TAKE 1 TABLET BY MOUTH EVERY MORNING, Disp: 30 tablet, Rfl: 2 .  cetirizine (ZYRTEC) 10 MG tablet, Take 10 mg by mouth daily., Disp: , Rfl:  .  Cholecalciferol (VITAMIN D3) 2000 units TABS, Take 1 tablet by mouth daily., Disp: , Rfl:  .  clobetasol cream (TEMOVATE) 0.05 %, APPLY TOPICALLY TO THE AFFECTED AREA TWICE DAILY FOR 14 DAYS, Disp: , Rfl:  .  DULoxetine (CYMBALTA) 30 MG capsule, TAKE 1 CAPSULE BY MOUTH EVERY DAY ALONG WITH 60MG  CAPSULE, Disp: 30 capsule, Rfl: 3 .  DULoxetine (CYMBALTA) 30 MG capsule, TAKE ONE CAPSULE BY MOUTH EVERY DAY ALONG WITH 60MG  CAPSULE, Disp: 90 capsule, Rfl: 0 .  DULoxetine (CYMBALTA) 60 MG capsule, Take 1 capsule (60 mg total) by mouth daily. Please schedule physical with labs for further refills. [phone], Disp: 90 capsule, Rfl: 0 .  eszopiclone (LUNESTA) 2 MG TABS tablet,  TAKE 1 TABLET BY MOUTH EVERY NIGHT AT BEDTIME AS NEEDED FOR SLEEP, Disp: 30 tablet, Rfl: 2 .  ketoconazole (NIZORAL) 2 % shampoo, APPLY TOPICALLY 2 TO 3 TIMES A WEEK, Disp: , Rfl:  .  metFORMIN (GLUCOPHAGE) 850 MG tablet, Take 1 tablet (850 mg total) by mouth 2 (two) times daily with a meal. Schedule physical with labs for refills. [phone], Disp: 30 tablet, Rfl: 0 .  MILI 0.25-35 MG-MCG tablet, , Disp: , Rfl:  .  spironolactone (ALDACTONE) 100 MG tablet, Take 100 mg by mouth 2 (two) times daily., Disp: , Rfl:  .  SUMAtriptan (IMITREX) 100 MG tablet, Take 1 tablet by mouth immediately and may repeat in 2 hours. Please schedule physical with labs for refills. [phone], Disp: 15 tablet, Rfl: 0 .  traZODone (DESYREL) 50 MG tablet, TAKE 1/2 TO 1 TABLET BY  MOUTH AT BEDTIME AS DIRECTED, Disp: 90 tablet, Rfl: 1 .  triamcinolone cream (KENALOG) 0.1 %, triamcinolone acetonide 0.1 % topical cream  APPLY EXTERNALLY TO THE AFFECTED AREA TWICE DAILY FOR 15 DAYS, Disp: , Rfl:  .  zinc gluconate 50 MG tablet, Take 50 mg by mouth daily., Disp: , Rfl:   EXAM: BP Readings from Last 3 Encounters:  07/24/19 124/72  05/14/19 126/72  11/14/18 118/62    VITALS per patient if applicable:  GENERAL: alert, oriented, appears well and in no acute distress  HEENT: atraumatic, conjunttiva clear, no obvious abnormalities on inspection of external nose and ears  NECK: normal movements of the head and neck  LUNGS: on inspection no signs of respiratory distress, breathing rate appears normal, no obvious gross SOB, gasping or wheezing  CV: no obvious cyanosis PSYCH/NEURO: pleasant and cooperative, no obvious depression or anxiety, speech and thought processing grossly intact Lab Results  Component Value Date   WBC 5.8 11/05/2018   HGB 13.0 11/05/2018   HCT 39.6 11/05/2018   PLT 358.0 11/05/2018   GLUCOSE 102 (H) 03/28/2019   CHOL 283 (H) 03/28/2019   TRIG 221.0 (H) 03/28/2019   HDL 66.40 03/28/2019   LDLDIRECT 183.0 03/28/2019   LDLCALC 160 (H) 11/03/2017   ALT 13 11/05/2018   AST 15 11/05/2018   NA 138 03/28/2019   K 4.3 03/28/2019   CL 101 03/28/2019   CREATININE 1.11 03/28/2019   BUN 14 03/28/2019   CO2 28 03/28/2019   TSH 3.54 11/05/2018   HGBA1C 6.0 03/28/2019   MICROALBUR 1.1 09/15/2010    ASSESSMENT AND PLAN:  Discussed the following assessment and plan:    ICD-10-CM   1. Adjustment disorder with depressed mood  F43.21    stable at present   2. Psychophysiological insomnia  F51.04    improved   3. Hyperglycemia  R73.9 CBC with Differential/Platelet    Hemoglobin A1c    Hepatic function panel    Lipid panel    TSH    BASIC METABOLIC PANEL WITH GFR  4. Hyperlipidemia, unspecified hyperlipidemia type  E78.5 CBC with  Differential/Platelet    Hemoglobin A1c    Hepatic function panel    Lipid panel    TSH    BASIC METABOLIC PANEL WITH GFR  5. History of gestational diabetes  Z86.32 CBC with Differential/Platelet    Hemoglobin A1c    Hepatic function panel    Lipid panel    TSH    BASIC METABOLIC PANEL WITH GFR  6. Medication management  Z79.899 CBC with Differential/Platelet    Hemoglobin A1c    Hepatic function panel  Lipid panel    TSH    BASIC METABOLIC PANEL WITH GFR  7. Visit for preventive health plan  Z00.00 CBC with Differential/Platelet    Hemoglobin A1c    Hepatic function panel    Lipid panel    TSH    BASIC METABOLIC PANEL WITH GFR    Counseled.  Over due lab monitoring   Make appt   Fasting and also in person cpx before end of year .   Expectant management and discussion of plan and treatment with opportunity to ask questions and all were answered. The patient agreed with the plan and demonstrated an understanding of the instructions.   Advised to call back or seek an in-person evaluation if worsening  or having  further concerns . Return for fasting labs soon  then   cpx in person before end of year .    Berniece Andreas, MD

## 2020-07-02 ENCOUNTER — Other Ambulatory Visit: Payer: Self-pay | Admitting: Internal Medicine

## 2020-07-15 ENCOUNTER — Ambulatory Visit: Payer: BC Managed Care – PPO | Admitting: Physician Assistant

## 2020-07-21 ENCOUNTER — Other Ambulatory Visit: Payer: Self-pay | Admitting: Internal Medicine

## 2020-07-23 ENCOUNTER — Other Ambulatory Visit: Payer: Self-pay

## 2020-07-23 ENCOUNTER — Other Ambulatory Visit (INDEPENDENT_AMBULATORY_CARE_PROVIDER_SITE_OTHER): Payer: BC Managed Care – PPO

## 2020-07-23 DIAGNOSIS — R739 Hyperglycemia, unspecified: Secondary | ICD-10-CM

## 2020-07-23 DIAGNOSIS — E785 Hyperlipidemia, unspecified: Secondary | ICD-10-CM

## 2020-07-23 DIAGNOSIS — Z8632 Personal history of gestational diabetes: Secondary | ICD-10-CM

## 2020-07-23 DIAGNOSIS — Z79899 Other long term (current) drug therapy: Secondary | ICD-10-CM | POA: Diagnosis not present

## 2020-07-23 DIAGNOSIS — Z0000 Encounter for general adult medical examination without abnormal findings: Secondary | ICD-10-CM

## 2020-07-24 LAB — CBC WITH DIFFERENTIAL/PLATELET
Absolute Monocytes: 440 cells/uL (ref 200–950)
Basophils Absolute: 56 cells/uL (ref 0–200)
Basophils Relative: 0.7 %
Eosinophils Absolute: 240 cells/uL (ref 15–500)
Eosinophils Relative: 3 %
HCT: 40 % (ref 35.0–45.0)
Hemoglobin: 13.2 g/dL (ref 11.7–15.5)
Lymphs Abs: 2200 cells/uL (ref 850–3900)
MCH: 28.9 pg (ref 27.0–33.0)
MCHC: 33 g/dL (ref 32.0–36.0)
MCV: 87.7 fL (ref 80.0–100.0)
MPV: 9.6 fL (ref 7.5–12.5)
Monocytes Relative: 5.5 %
Neutro Abs: 5064 cells/uL (ref 1500–7800)
Neutrophils Relative %: 63.3 %
Platelets: 331 10*3/uL (ref 140–400)
RBC: 4.56 10*6/uL (ref 3.80–5.10)
RDW: 12.8 % (ref 11.0–15.0)
Total Lymphocyte: 27.5 %
WBC: 8 10*3/uL (ref 3.8–10.8)

## 2020-07-24 LAB — HEPATIC FUNCTION PANEL
AG Ratio: 1.3 (calc) (ref 1.0–2.5)
ALT: 14 U/L (ref 6–29)
AST: 14 U/L (ref 10–30)
Albumin: 3.8 g/dL (ref 3.6–5.1)
Alkaline phosphatase (APISO): 46 U/L (ref 31–125)
Bilirubin, Direct: 0 mg/dL (ref 0.0–0.2)
Globulin: 2.9 g/dL (calc) (ref 1.9–3.7)
Indirect Bilirubin: 0.3 mg/dL (calc) (ref 0.2–1.2)
Total Bilirubin: 0.3 mg/dL (ref 0.2–1.2)
Total Protein: 6.7 g/dL (ref 6.1–8.1)

## 2020-07-24 LAB — BASIC METABOLIC PANEL WITH GFR
BUN: 14 mg/dL (ref 7–25)
CO2: 26 mmol/L (ref 20–32)
Calcium: 9.5 mg/dL (ref 8.6–10.2)
Chloride: 103 mmol/L (ref 98–110)
Creat: 1.03 mg/dL (ref 0.50–1.10)
GFR, Est African American: 78 mL/min/{1.73_m2} (ref >=60)
GFR, Est Non African American: 67 mL/min/{1.73_m2} (ref >=60)
Glucose, Bld: 115 mg/dL — ABNORMAL HIGH (ref 65–99)
Potassium: 4.8 mmol/L (ref 3.5–5.3)
Sodium: 137 mmol/L (ref 135–146)

## 2020-07-24 LAB — LIPID PANEL
Cholesterol: 267 mg/dL — ABNORMAL HIGH (ref <200)
HDL: 66 mg/dL (ref >=50)
LDL Cholesterol (Calc): 162 mg/dL (calc) — ABNORMAL HIGH
Non-HDL Cholesterol (Calc): 201 mg/dL (calc) — ABNORMAL HIGH (ref <130)
Total CHOL/HDL Ratio: 4 (calc) (ref <5.0)
Triglycerides: 228 mg/dL — ABNORMAL HIGH (ref <150)

## 2020-07-24 LAB — HEMOGLOBIN A1C
Hgb A1c MFr Bld: 5.9 % of total Hgb — ABNORMAL HIGH (ref <5.7)
Mean Plasma Glucose: 123 (calc)
eAG (mmol/L): 6.8 (calc)

## 2020-07-24 LAB — TSH: TSH: 2.43 mIU/L

## 2020-07-30 NOTE — Progress Notes (Signed)
Sugar stab;e  a1c   lipid slightly better  but still high   . 10 year risk is low   We can follow   can consider medication   can discuss at  cpx   Continue lifestyle intervention healthy eating and exercise .  The 10-year ASCVD risk score Denman George DC Montez Hageman., et al., 2013) is: 1.7%   Values used to calculate the score:     Age: 44 years     Sex: Female     Is Non-Hispanic African American: No     Diabetic: Yes     Tobacco smoker: No     Systolic Blood Pressure: 114 mmHg     Is BP treated: Yes     HDL Cholesterol: 66 mg/dL     Total Cholesterol: 267 mg/dL

## 2020-08-14 ENCOUNTER — Encounter: Payer: Self-pay | Admitting: Nurse Practitioner

## 2020-08-14 ENCOUNTER — Ambulatory Visit: Payer: BC Managed Care – PPO | Admitting: Nurse Practitioner

## 2020-08-14 VITALS — BP 122/90 | HR 88 | Ht 67.25 in | Wt 210.0 lb

## 2020-08-14 DIAGNOSIS — K5909 Other constipation: Secondary | ICD-10-CM | POA: Diagnosis not present

## 2020-08-14 DIAGNOSIS — K625 Hemorrhage of anus and rectum: Secondary | ICD-10-CM

## 2020-08-14 MED ORDER — POLYETHYLENE GLYCOL 3350 17 G PO PACK: 17.0000 g | PACK | Freq: Every day | ORAL | 0 refills | Status: DC

## 2020-08-14 MED ORDER — SUTAB 1479-225-188 MG PO TABS: 24.0000 | ORAL_TABLET | ORAL | 0 refills | Status: DC

## 2020-08-14 NOTE — Progress Notes (Signed)
ASSESSMENT AND PLAN    # 44 yo female with chronic constipation consisting of hard infrequent bowel movements.  --Trial of 1 capful of miralax daily in 8 0z water daily --Increase water to minumin of 48 oz daily.   # Blood in stool. Occasionally has a streak of blood in stool but in August had episode of a larger amount of blood. Also, mucous in stool a few times over last several months. Suspect bleeding is hemorrhoidal. However, last colonoscopy was in 2012 with only a fair bowel prep.  It is reasonable to proceed with colonoscopy for further evaluation of the bleeding. Regarding the mucous,  I explained that it isn't uncommon to have mucous in stools. In absence of diarrhea it is unlikely to reflect inflammation.  --Patient will be scheduled for a colonoscopy. The risks and benefits of colonoscopy with possible polypectomy / biopsies were discussed and the patient agrees to proceed.  --Two day bowel prep for colonoscopy    HISTORY OF PRESENT ILLNESS     Primary Gastroenterologist : Erick Blinks, MD  Chief Complaint : bowel changes  Sandra Hudson is a 44 y.o. female with PMH / PSH significant for,  but not necessarily limited to: hypertriglyceridemia, hyperglycemia,  acne, insomnia and depression  Patient was last seen in 2012.  She is now referred by PCP for evaluation of bowel changes  Typically patient has a large formed BM about twice.a week. Stools are often hard and can be painful to pass. Over the last six months patient has had a couple of episodes of where there was mucous in in her stools.  She sometimes has a streak of blood in stool which she attributes to hemorrhoids. In August she had a BM containing a large streak of blood which discolored the toilet water.   Patient has been eating Fiber One and more fruit and that has helped her constipation. A month or so ago she took psyllium husk for a couple of days but developed excruicating back and abdominal pain. She.  feels  like she doesn't drink enough water. Averages 32 oz of water daily.   Patient has no [facility] of colon cancer.   Data Reviewed: October 2021 CBC normal Hemoglobin A1c 5.9 Hepatic function panel normal TSH normal   Previous Endoscopic Evaluations / Pertinent Studies:  Nov 2012 colonoscopy with TI intubation. for lower abdominal pain and blood in stool --Fair prep --Small internal hemorrhoids   Past Medical History:  Diagnosis Date  . Allergic rhinitis due to dust   . Allergy   . Breech presentation 01/29/2014  . Female infertility   . Headache(784.0)   . Pre-eclampsia 01/29/2014  . Pregnancy induced hypertension   . Status post primary low transverse cesarean section 02/01/2014     Past Surgical History:  Procedure Laterality Date  . CESAREAN SECTION N/A 01/31/2014   Procedure: CESAREAN SECTION;  Surgeon: Purcell Nails, MD;  Location: [facility];  Service: Obstetrics;  Laterality: N/A;  . NASAL SEPTUM SURGERY     deviated septum and large turbinate   Family History  Problem Relation Age of Onset  . Depression Father   . ALS Father        multiple system atropy  . Depression Mother   . Breast cancer Other        maternal great aunt  . Breast cancer Paternal Grandmother   . Colon cancer Neg Hx   . Pancreatic cancer Neg Hx   . Esophageal cancer  Neg Hx    Social History   Tobacco Use  . Smoking status: Never Smoker  . Smokeless tobacco: Never Used  Vaping Use  . Vaping Use: Never used  Substance Use Topics  . Alcohol use: No    Comment: occasionally  . Drug use: No   Current Outpatient Medications  Medication Sig Dispense Refill  . Biotin (BIOTIN 5000) 5 MG CAPS Take 1 capsule by mouth daily.    Marland Kitchen buPROPion (WELLBUTRIN XL) 300 MG 24 hr tablet TAKE 1 TABLET BY MOUTH EVERY MORNING 30 tablet 2  . cetirizine (ZYRTEC) 10 MG tablet Take 10 mg by mouth daily.    . Cholecalciferol (VITAMIN D3) 2000 units TABS Take 1 tablet by mouth daily.    . clobetasol cream (TEMOVATE)  0.05 % APPLY TOPICALLY TO THE AFFECTED AREA TWICE DAILY FOR 14 DAYS    . DULoxetine (CYMBALTA) 30 MG capsule TAKE ONE CAPSULE BY MOUTH EVERY DAY ALONG WITH 60MG  CAPSULE 90 capsule 0  . DULoxetine (CYMBALTA) 60 MG capsule Take 1 capsule (60 mg total) by mouth daily. Please schedule physical with labs for further refills. [phone] 90 capsule 0  . eszopiclone (LUNESTA) 2 MG TABS tablet TAKE 1 TABLET BY MOUTH EVERY NIGHT AT BEDTIME AS NEEDED FOR SLEEP 30 tablet 2  . ketoconazole (NIZORAL) 2 % shampoo APPLY TOPICALLY 2 TO 3 TIMES A WEEK    . metFORMIN (GLUCOPHAGE) 850 MG tablet Take 1 tablet (850 mg total) by mouth 2 (two) times daily with a meal. Schedule physical with labs for refills. [phone] 30 tablet 0  . MILI 0.25-35 MG-MCG tablet     . spironolactone (ALDACTONE) 100 MG tablet Take 100 mg by mouth 2 (two) times daily.    . SUMAtriptan (IMITREX) 100 MG tablet TAKE 1 TABLET MOUTH IMMEDIATELY AND MAY REPEAT IN 2 HOURS--Please schedule labs for refills. 15 tablet 0  . traZODone (DESYREL) 50 MG tablet TAKE 1/2 TO 1 TABLET BY MOUTH AT BEDTIME AS DIRECTED 90 tablet 1  . triamcinolone cream (KENALOG) 0.1 % triamcinolone acetonide 0.1 % topical cream  APPLY EXTERNALLY TO THE AFFECTED AREA TWICE DAILY FOR 15 DAYS    . zinc gluconate 50 MG tablet Take 50 mg by mouth daily.     No current facility-administered medications for this visit.   No Known Allergies   Review of Systems: All systems reviewed and negative except where noted in HPI.   PHYSICAL EXAM :    Wt Readings from Last 3 Encounters:  08/14/20 210 lb (95.3 kg)  06/29/20 208 lb (94.3 kg)  01/03/20 205 lb (93 kg)    Wt 210 lb (95.3 kg)   LMP 07/29/2020   BMI 32.41 kg/m  Constitutional:  Pleasant female in no acute distress. Psychiatric: Normal mood and affect. Behavior is normal. EENT: Pupils normal.  Conjunctivae are normal. No scleral icterus. Neck supple.  Cardiovascular: Normal rate, regular rhythm. No  edema Pulmonary/chest: Effort normal and breath sounds normal. No wheezing, rales or rhonchi. Abdominal: Soft, nondistended, nontender. Bowel sounds active throughout. There are no masses palpable. No hepatomegaly. Neurological: Alert and oriented to person place and time. Skin: Skin is warm and dry. No rashes noted.  07/31/2020, NP  08/14/2020, 11:41 AM  Cc:  Referring Provider Panosh, 08/16/2020, MD

## 2020-08-14 NOTE — Patient Instructions (Signed)
If you are age 43 or older, your body mass index should be between 23-30. Your Body mass index is 32.65 kg/m. If this is out of the aforementioned range listed, please consider follow up with your Primary Care Provider.  If you are age 70 or younger, your body mass index should be between 19-25. Your Body mass index is 32.65 kg/m. If this is out of the aformentioned range listed, please consider follow up with your Primary Care Provider.   You have been scheduled for a colonoscopy. Please follow written instructions given to you at your visit today.  Please pick up your prep supplies at the pharmacy within the next 1-3 days. If you use inhalers (even only as needed), please bring them with you on the day of your procedure.  Due to recent changes in healthcare laws, you may see the results of your imaging and laboratory studies on MyChart before your provider has had a chance to review them.  We understand that in some cases there may be results that are confusing or concerning to you. Not all laboratory results come back in the same time frame and the provider may be waiting for multiple results in order to interpret others.  Please give [place] 48 hours in order for your provider to thoroughly review all the results before contacting the office for clarification of your results.    Please purchase the following medications over the counter and take as directed:  START: Miralax take 1 capful in 8 ounces of water daily.  Please increase water intake to 48 ounces daily.  Thank you for entrusting me with your care and choosing [facility].   Willette Cluster, NP

## 2020-08-15 ENCOUNTER — Other Ambulatory Visit: Payer: Self-pay | Admitting: Internal Medicine

## 2020-08-18 NOTE — Progress Notes (Signed)
Addendum: Reviewed and agree with assessment and management plan. Toren Tucholski M, MD  

## 2020-08-31 ENCOUNTER — Other Ambulatory Visit: Payer: Self-pay | Admitting: Internal Medicine

## 2020-09-01 NOTE — Progress Notes (Signed)
Chief Complaint  Patient presents with  . Annual Exam  . Medication Management    HPI: Patient  Sandra Hudson  44 y.o. comes in today for Preventive Health Care visit  And med eval    Metformin .  forgets second dose at times s  Taking bid    [place]     Has helped and kids going back to school .also   Disorganized .  Always but husband being home has been helpful  NO vision numbness problems   utd on  mammo pat gyne  To get colon soon    Health Maintenance  Topic Date Due  . PNEUMOCOCCAL POLYSACCHARIDE VACCINE AGE 63-64 HIGH RISK  Never done  . FOOT EXAM  Never done  . URINE MICROALBUMIN  09/16/2011  . HEMOGLOBIN A1C  01/21/2021  . OPHTHALMOLOGY EXAM  05/13/2021  . PAP SMEAR-Modifier  08/27/2021  . TETANUS/TDAP  02/02/2024  . INFLUENZA VACCINE  Completed  . COVID-19 Vaccine  Completed  . Hepatitis C Screening  Completed  . HIV Screening  Completed   Health Maintenance Review LIFESTYLE:  Exercise:   Picked  Up.   Tobacco/ETS: Alcohol:  Rare    Sugar beverages: no Sleep:  Getting better    8-9  Drug use: no HH of  4  Work: husband in between jobs.   Girls   In school.   ROS:  GEN/ HEENT: No fever, significant weight changes sweats headaches vision problems hearing changes, CV/ PULM; No chest pain shortness of breath cough, syncope,edema  change in exercise tolerance. GI /GU: No adominal pain, vomiting, change in bowel habits. No blood in the stool. No significant GU symptoms. SKIN/HEME: ,no acute skin rashes suspicious lesions or bleeding. No lymphadenopathy, nodules, masses.  NEURO/ PSYCH:  No neurologic signs such as weakness numbness. No depression anxiety. IMM/ Allergy: No unusual infections.  Allergy .   REST of 12 system review negative except as per HPI   Past Medical History:  Diagnosis Date  . Allergic rhinitis due to dust   . Allergy   . Breech presentation 01/29/2014  . Female infertility   . Headache(784.0)   . Pre-eclampsia  01/29/2014  . Pregnancy induced hypertension   . Status post primary low transverse cesarean section 02/01/2014    Past Surgical History:  Procedure Laterality Date  . CESAREAN SECTION N/A 01/31/2014   Procedure: CESAREAN SECTION;  Surgeon: Delice Lesch, MD;  Location: [facility];  Service: Obstetrics;  Laterality: N/A;  . COLONOSCOPY  09/16/2011  . NASAL SEPTUM SURGERY     deviated septum and large turbinate    Family History  Problem Relation Age of Onset  . Depression Father   . ALS Father        multiple system atropy  . Depression Mother   . Breast cancer Other        maternal great aunt  . Breast cancer Paternal Grandmother   . Colon cancer Neg Hx   . Pancreatic cancer Neg Hx   . Esophageal cancer Neg Hx     Social History   Socioeconomic History  . Marital status: Married    Spouse name: Not on file  . Number of children: 1  . Years of education: Not on file  . Highest education level: Not on file  Occupational History    Employer: UNEMPLOYED  Tobacco Use  . Smoking status: Never Smoker  . Smokeless tobacco: Never Used  Vaping Use  .  Vaping Use: Never used  Substance and Sexual Activity  . Alcohol use: No    Comment: occasionally  . Drug use: No  . Sexual activity: Not on file  Other Topics Concern  . 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   Social Determinants of Health   Financial Resource Strain:   . Difficulty of Paying Living Expenses: Not on file  Food Insecurity:   . Worried About Charity fundraiser in the Last Year: Not on file  . Ran Out of Food in the Last Year: Not on file  Transportation Needs:   . Lack of Transportation (Medical): Not on file  . Lack of Transportation (Non-Medical): Not on file  Physical Activity:   . Days of Exercise per Week: Not on file  . Minutes of Exercise per Session: Not on file  Stress:   . Feeling of  Stress : Not on file  Social Connections:   . Frequency of Communication with Friends and Family: Not on file  . Frequency of Social Gatherings with Friends and Family: Not on file  . Attends Religious Services: Not on file  . Active Member of Clubs or Organizations: Not on file  . Attends Archivist Meetings: Not on file  . Marital Status: Not on file    Outpatient Medications Prior to Visit  Medication Sig Dispense Refill  . Biotin (BIOTIN 5000) 5 MG CAPS Take 1 capsule by mouth daily.    Marland Kitchen buPROPion (WELLBUTRIN XL) 300 MG 24 hr tablet TAKE 1 TABLET BY MOUTH EVERY MORNING 30 tablet 2  . cetirizine (ZYRTEC) 10 MG tablet Take 10 mg by mouth daily.    . Cholecalciferol (VITAMIN D3) 2000 units TABS Take 1 tablet by mouth daily.    . clobetasol cream (TEMOVATE) 0.05 % APPLY TOPICALLY TO THE AFFECTED AREA TWICE DAILY FOR 14 DAYS    . DULoxetine (CYMBALTA) 30 MG capsule TAKE ONE CAPSULE BY MOUTH EVERY DAY ALONG WITH 60MG CAPSULE 90 capsule 0  . DULoxetine (CYMBALTA) 60 MG capsule Take 1 capsule (60 mg total) by mouth daily. Please schedule physical with labs for further refills. [phone] 90 capsule 0  . eszopiclone (LUNESTA) 2 MG TABS tablet TAKE 1 TABLET BY MOUTH EVERY NIGHT AT BEDTIME AS NEEDED FOR SLEEP 30 tablet 2  . ketoconazole (NIZORAL) 2 % shampoo APPLY TOPICALLY 2 TO 3 TIMES A WEEK    . metFORMIN (GLUCOPHAGE) 850 MG tablet TAKE 1 TABLET BY MOUTH TWICE DAILY. SCHEDULE OFFICE VISIT 30 tablet 0  . MILI 0.25-35 MG-MCG tablet     . polyethylene glycol (MIRALAX) 17 g packet Take 17 g by mouth daily. 14 each 0  . Sodium Sulfate-Mag Sulfate-KCl (SUTAB) [phone] MG TABS Take 24 tablets by mouth as directed. 24 tablet 0  . spironolactone (ALDACTONE) 100 MG tablet Take 100 mg by mouth 2 (two) times daily.    . SUMAtriptan (IMITREX) 100 MG tablet TAKE 1 TABLET MOUTH IMMEDIATELY AND MAY REPEAT IN 2 HOURS--Please schedule labs for refills. 15 tablet 0  . traZODone (DESYREL) 50 MG  tablet TAKE 1/2 TO 1 TABLET BY MOUTH AT BEDTIME AS DIRECTED 90 tablet 1  . triamcinolone cream (KENALOG) 0.1 % triamcinolone acetonide 0.1 % topical cream  APPLY EXTERNALLY TO THE AFFECTED AREA TWICE DAILY FOR 15  DAYS    . zinc gluconate 50 MG tablet Take 50 mg by mouth daily.     No facility-administered medications prior to visit.     EXAM:  BP 122/78   Pulse 76   Temp 98.4 F (36.9 C) (Oral)   Ht 5' 7" (1.702 m)   Wt 208 lb (94.3 kg)   SpO2 98%   BMI 32.58 kg/m   Body mass index is 32.58 kg/m. Wt Readings from Last 3 Encounters:  09/02/20 208 lb (94.3 kg)  08/14/20 210 lb (95.3 kg)  06/29/20 208 lb (94.3 kg)    Physical Exam: Vital signs reviewed WRU:EAVW is a well-developed well-nourished alert cooperative    who appearsr stated age in no acute distress.  HEENT: normocephalic atraumatic , Eyes: PERRL EOM's full, conjunctiva clear, Nares: paten,t no deformity discharge or tenderness., Ears: no deformity EAC's clear TMs with normal landmarks. Mouth:masked . NECK: supple without masses, thyromegaly or bruits. CHEST/PULM:  Clear to auscultation and percussion breath sounds equal no wheeze , rales or rhonchi. No chest wall deformities or tenderness. Breast: normal by inspection . No dimpling, discharge, masses, tenderness or discharge . CV: PMI is nondisplaced, S1 S2 no gallops, murmurs, rubs. Peripheral pulses are full without delay.No JVD .  ABDOMEN: Bowel sounds normal nontender  No guard or rebound, no hepato splenomegal no CVA tenderness.   Extremtities:  No clubbing cyanosis or edema, no acute joint swelling or redness no focal atrophy NEURO:  Oriented x3, cranial nerves 3-12 appear to be intact, no obvious focal weakness,gait within normal limits no abnormal reflexes or asymmetrical SKIN: No acute rashes normal turgor, color, no bruising or petechiae. PSYCH: Oriented, good eye contact, no obvious depression anxiety, cognition and judgment appear normal. LN: no cervical  axillary inguinal adenopathy  Lab Results  Component Value Date   WBC 8.0 07/23/2020   HGB 13.2 07/23/2020   HCT 40.0 07/23/2020   PLT 331 07/23/2020   GLUCOSE 115 (H) 07/23/2020   CHOL 267 (H) 07/23/2020   TRIG 228 (H) 07/23/2020   HDL 66 07/23/2020   LDLDIRECT 183.0 03/28/2019   LDLCALC 162 (H) 07/23/2020   ALT 14 07/23/2020   AST 14 07/23/2020   NA 137 07/23/2020   K 4.8 07/23/2020   CL 103 07/23/2020   CREATININE 1.03 07/23/2020   BUN 14 07/23/2020   CO2 26 07/23/2020   TSH 2.43 07/23/2020   HGBA1C 5.9 (H) 07/23/2020   MICROALBUR 1.1 09/15/2010    BP Readings from Last 3 Encounters:  09/02/20 122/78  08/14/20 122/90  07/24/19 124/72    Lab results reviewed with patient   ASSESSMENT AND PLAN:  Discussed the following assessment and plan:    ICD-10-CM   1. Encounter for preventative adult health care exam with abnormal findings  Z00.01   2. Hyperglycemia  R73.9 Hemoglobin A1c    Lipid panel    Glucose, Random  3. Hyperlipidemia, unspecified hyperlipidemia type  E78.5 Hemoglobin A1c    Lipid panel    Glucose, Random  4. History of gestational diabetes  Z86.32 Hemoglobin A1c    Lipid panel    Glucose, Random  5. Medication management  Z79.899   6. Need for influenza vaccination  Z23 Flu Vaccine QUAD 6+ mos PF IM (Fluarix Quad PF)  7. Adjustment disorder with depressed mood  F43.21    immporved on current meds   flu vaccine    Bg better  A 1c    Take met  Bid as best  possible lipids could be better but has low 10 year risk  Will follow up   In about 3 mos  With int  lsi  Mood is better  On current meds and school returning   Continue at  Present  Same   Return for fsting lab in 3 months and then ROv or video fu with results .  Patient Care Team: Burnis Medin, MD as PCP - Blake Divine, MD Donnel Saxon, CNM as Midwife Pyrtle, Lajuan Lines, MD (Gastroenterology) Renda Rolls Jennefer Bravo, MD as Referring Physician (Dermatology) Patient Instructions    Intensify lifestyle interventions.  As you  Are doing   As  planned  Plan lab in  3 months to get lipids and hg a1c .   And fasting glucose   The 10-year ASCVD risk score Mikey Bussing DC Jr., et al., 2013) is: 1.9%   Values used to calculate the score:     Age: 43 years     Sex: Female     Is Non-Hispanic African American: No     Diabetic: Yes     Tobacco smoker: No     Systolic Blood Pressure: 147 mmHg     Is BP treated: Yes     HDL Cholesterol: 66 mg/dL     Total Cholesterol: 267 mg/dL    Health Maintenance, Female Adopting a healthy lifestyle and getting preventive care are important in promoting health and wellness. Ask your health care provider about:  The right schedule for you to have regular tests and exams.  Things you can do on your own to prevent diseases and keep yourself healthy. What should I know about diet, weight, and exercise? Eat a healthy diet   Eat a diet that includes plenty of vegetables, fruits, low-fat dairy products, and lean protein.  Do not eat a lot of foods that are high in solid fats, added sugars, or sodium. Maintain a healthy weight Body mass index (BMI) is used to identify weight problems. It estimates body fat based on height and weight. Your health care provider can help determine your BMI and help you achieve or maintain a healthy weight. Get regular exercise Get regular exercise. This is one of the most important things you can do for your health. Most adults should:  Exercise for at least 150 minutes each week. The exercise should increase your heart rate and make you sweat (moderate-intensity exercise).  Do strengthening exercises at least twice a week. This is in addition to the moderate-intensity exercise.  Spend less time sitting. Even light physical activity can be beneficial. Watch cholesterol and blood lipids Have your blood tested for lipids and cholesterol at 43 years of age, then have this test every 5 years. Have your  cholesterol levels checked more often if:  Your lipid or cholesterol levels are high.  You are older than 43 years of age.  You are at high risk for heart disease. What should I know about cancer screening? Depending on your health history and family history, you may need to have cancer screening at various ages. This may include screening for:  Breast cancer.  Cervical cancer.  Colorectal cancer.  Skin cancer.  Lung cancer. What should I know about heart disease, diabetes, and high blood pressure? Blood pressure and heart disease  High blood pressure causes heart disease and increases the risk of stroke. This is more likely to develop in people who have high blood pressure readings, are of African descent, or are overweight.  Have  your blood pressure checked: ? Every 3-5 years if you are 26-33 years of age. ? Every year if you are 53 years old or older. Diabetes Have regular diabetes screenings. This checks your fasting blood sugar level. Have the screening done:  Once every three years after age 10 if you are at a normal weight and have a low risk for diabetes.  More often and at a younger age if you are overweight or have a high risk for diabetes. What should I know about preventing infection? Hepatitis B If you have a higher risk for hepatitis B, you should be screened for this virus. Talk with your health care provider to find out if you are at risk for hepatitis B infection. Hepatitis C Testing is recommended for:  Everyone born from [birth year] through 1965.  Anyone with known risk factors for hepatitis C. Sexually transmitted infections (STIs)  Get screened for STIs, including gonorrhea and chlamydia, if: ? You are sexually active and are younger than 43 years of age. ? You are older than 43 years of age and your health care provider tells you that you are at risk for this type of infection. ? Your sexual activity has changed since you were last screened, and you are  at increased risk for chlamydia or gonorrhea. Ask your health care provider if you are at risk.  Ask your health care provider about whether you are at high risk for HIV. Your health care provider may recommend a prescription medicine to help prevent HIV infection. If you choose to take medicine to prevent HIV, you should first get tested for HIV. You should then be tested every 3 months for as long as you are taking the medicine. Pregnancy  If you are about to stop having your period (premenopausal) and you may become pregnant, seek counseling before you get pregnant.  Take 400 to 800 micrograms (mcg) of folic acid every day if you become pregnant.  Ask for birth control (contraception) if you want to prevent pregnancy. Osteoporosis and menopause Osteoporosis is a disease in which the bones lose minerals and strength with aging. This can result in bone fractures. If you are 5 years old or older, or if you are at risk for osteoporosis and fractures, ask your health care provider if you should:  Be screened for bone loss.  Take a calcium or vitamin D supplement to lower your risk of fractures.  Be given hormone replacement therapy (HRT) to treat symptoms of menopause. Follow these instructions at home: Lifestyle  Do not use any products that contain nicotine or tobacco, such as cigarettes, e-cigarettes, and chewing tobacco. If you need help quitting, ask your health care provider.  Do not use street drugs.  Do not share needles.  Ask your health care provider for help if you need support or information about quitting drugs. Alcohol use  Do not drink alcohol if: ? Your health care provider tells you not to drink. ? You are pregnant, may be pregnant, or are planning to become pregnant.  If you drink alcohol: ? Limit how much you use to 0-1 drink a day. ? Limit intake if you are breastfeeding.  Be aware of how much alcohol is in your drink. In the [place] one drink equals one 12 oz  bottle of beer (355 mL), one 5 oz glass of wine (148 mL), or one 1 oz glass of hard liquor (44 mL). General instructions  Schedule regular health, dental, and eye exams.  Stay current  with your vaccines.  Tell your health care provider if: ? You often feel depressed. ? You have ever been abused or do not feel safe at home. Summary  Adopting a healthy lifestyle and getting preventive care are important in promoting health and wellness.  Follow your health care provider's instructions about healthy diet, exercising, and getting tested or screened for diseases.  Follow your health care provider's instructions on monitoring your cholesterol and blood pressure. This information is not intended to replace advice given to you by your health care provider. Make sure you discuss any questions you have with your health care provider. Document Revised: 09/26/2018 Document Reviewed: 09/26/2018 Elsevier Patient Education  2020 [place] Charlotta Lapaglia M.D.

## 2020-09-02 ENCOUNTER — Other Ambulatory Visit: Payer: Self-pay

## 2020-09-02 ENCOUNTER — Ambulatory Visit: Payer: BC Managed Care – PPO | Admitting: Internal Medicine

## 2020-09-02 ENCOUNTER — Encounter: Payer: Self-pay | Admitting: Internal Medicine

## 2020-09-02 VITALS — BP 122/78 | HR 76 | Temp 98.4°F | Ht 67.0 in | Wt 208.0 lb

## 2020-09-02 DIAGNOSIS — F4321 Adjustment disorder with depressed mood: Secondary | ICD-10-CM

## 2020-09-02 DIAGNOSIS — Z23 Encounter for immunization: Secondary | ICD-10-CM

## 2020-09-02 DIAGNOSIS — E785 Hyperlipidemia, unspecified: Secondary | ICD-10-CM | POA: Diagnosis not present

## 2020-09-02 DIAGNOSIS — R739 Hyperglycemia, unspecified: Secondary | ICD-10-CM

## 2020-09-02 DIAGNOSIS — Z8632 Personal history of gestational diabetes: Secondary | ICD-10-CM | POA: Diagnosis not present

## 2020-09-02 DIAGNOSIS — Z0001 Encounter for general adult medical examination with abnormal findings: Secondary | ICD-10-CM

## 2020-09-02 DIAGNOSIS — Z0000 Encounter for general adult medical examination without abnormal findings: Secondary | ICD-10-CM

## 2020-09-02 DIAGNOSIS — Z79899 Other long term (current) drug therapy: Secondary | ICD-10-CM

## 2020-09-02 NOTE — Patient Instructions (Addendum)
Intensify lifestyle interventions.  As you  Are doing   As  planned  Plan lab in  3 months to get lipids and hg a1c .   And fasting glucose   The 10-year ASCVD risk score Denman George DC Jr., et al., 2013) is: 1.9%   Values used to calculate the score:     Age: 44 years     Sex: Female     Is Non-Hispanic African American: No     Diabetic: Yes     Tobacco smoker: No     Systolic Blood Pressure: 122 mmHg     Is BP treated: Yes     HDL Cholesterol: 66 mg/dL     Total Cholesterol: 267 mg/dL    Health Maintenance, Female Adopting a healthy lifestyle and getting preventive care are important in promoting health and wellness. Ask your health care provider about:  The right schedule for you to have regular tests and exams.  Things you can do on your own to prevent diseases and keep yourself healthy. What should I know about diet, weight, and exercise? Eat a healthy diet   Eat a diet that includes plenty of vegetables, fruits, low-fat dairy products, and lean protein.  Do not eat a lot of foods that are high in solid fats, added sugars, or sodium. Maintain a healthy weight Body mass index (BMI) is used to identify weight problems. It estimates body fat based on height and weight. Your health care provider can help determine your BMI and help you achieve or maintain a healthy weight. Get regular exercise Get regular exercise. This is one of the most important things you can do for your health. Most adults should:  Exercise for at least 150 minutes each week. The exercise should increase your heart rate and make you sweat (moderate-intensity exercise).  Do strengthening exercises at least twice a week. This is in addition to the moderate-intensity exercise.  Spend less time sitting. Even light physical activity can be beneficial. Watch cholesterol and blood lipids Have your blood tested for lipids and cholesterol at 43 years of age, then have this test every 5 years. Have your cholesterol  levels checked more often if:  Your lipid or cholesterol levels are high.  You are older than 43 years of age.  You are at high risk for heart disease. What should I know about cancer screening? Depending on your health history and family history, you may need to have cancer screening at various ages. This may include screening for:  Breast cancer.  Cervical cancer.  Colorectal cancer.  Skin cancer.  Lung cancer. What should I know about heart disease, diabetes, and high blood pressure? Blood pressure and heart disease  High blood pressure causes heart disease and increases the risk of stroke. This is more likely to develop in people who have high blood pressure readings, are of African descent, or are overweight.  Have your blood pressure checked: ? Every 3-5 years if you are 39-51 years of age. ? Every year if you are 84 years old or older. Diabetes Have regular diabetes screenings. This checks your fasting blood sugar level. Have the screening done:  Once every three years after age 1 if you are at a normal weight and have a low risk for diabetes.  More often and at a younger age if you are overweight or have a high risk for diabetes. What should I know about preventing infection? Hepatitis B If you have a higher risk for hepatitis B,  you should be screened for this virus. Talk with your health care provider to find out if you are at risk for hepatitis B infection. Hepatitis C Testing is recommended for:  Everyone born from [birth year] through 1965.  Anyone with known risk factors for hepatitis C. Sexually transmitted infections (STIs)  Get screened for STIs, including gonorrhea and chlamydia, if: ? You are sexually active and are younger than 43 years of age. ? You are older than 43 years of age and your health care provider tells you that you are at risk for this type of infection. ? Your sexual activity has changed since you were last screened, and you are at increased  risk for chlamydia or gonorrhea. Ask your health care provider if you are at risk.  Ask your health care provider about whether you are at high risk for HIV. Your health care provider may recommend a prescription medicine to help prevent HIV infection. If you choose to take medicine to prevent HIV, you should first get tested for HIV. You should then be tested every 3 months for as long as you are taking the medicine. Pregnancy  If you are about to stop having your period (premenopausal) and you may become pregnant, seek counseling before you get pregnant.  Take 400 to 800 micrograms (mcg) of folic acid every day if you become pregnant.  Ask for birth control (contraception) if you want to prevent pregnancy. Osteoporosis and menopause Osteoporosis is a disease in which the bones lose minerals and strength with aging. This can result in bone fractures. If you are 43 years old or older, or if you are at risk for osteoporosis and fractures, ask your health care provider if you should:  Be screened for bone loss.  Take a calcium or vitamin D supplement to lower your risk of fractures.  Be given hormone replacement therapy (HRT) to treat symptoms of menopause. Follow these instructions at home: Lifestyle  Do not use any products that contain nicotine or tobacco, such as cigarettes, e-cigarettes, and chewing tobacco. If you need help quitting, ask your health care provider.  Do not use street drugs.  Do not share needles.  Ask your health care provider for help if you need support or information about quitting drugs. Alcohol use  Do not drink alcohol if: ? Your health care provider tells you not to drink. ? You are pregnant, may be pregnant, or are planning to become pregnant.  If you drink alcohol: ? Limit how much you use to 0-1 drink a day. ? Limit intake if you are breastfeeding.  Be aware of how much alcohol is in your drink. In the [place] one drink equals one 12 oz bottle of beer  (355 mL), one 5 oz glass of wine (148 mL), or one 1 oz glass of hard liquor (44 mL). General instructions  Schedule regular health, dental, and eye exams.  Stay current with your vaccines.  Tell your health care provider if: ? You often feel depressed. ? You have ever been abused or do not feel safe at home. Summary  Adopting a healthy lifestyle and getting preventive care are important in promoting health and wellness.  Follow your health care provider's instructions about healthy diet, exercising, and getting tested or screened for diseases.  Follow your health care provider's instructions on monitoring your cholesterol and blood pressure. This information is not intended to replace advice given to you by your health care provider. Make sure you discuss any questions you have  with your health care provider. Document Revised: 09/26/2018 Document Reviewed: 09/26/2018 Elsevier Patient Education  2020 [employer].

## 2020-09-08 ENCOUNTER — Encounter: Payer: Self-pay | Admitting: Internal Medicine

## 2020-09-08 ENCOUNTER — Other Ambulatory Visit: Payer: Self-pay | Admitting: Pulmonary Disease

## 2020-09-08 ENCOUNTER — Telehealth: Payer: Self-pay | Admitting: Pulmonary Disease

## 2020-09-08 DIAGNOSIS — G4700 Insomnia, unspecified: Secondary | ICD-10-CM

## 2020-09-08 DIAGNOSIS — G4701 Insomnia due to medical condition: Secondary | ICD-10-CM

## 2020-09-08 NOTE — Telephone Encounter (Signed)
lmtcb for pt.   Pt needs OV last seen 09/2019 and advised to follow up in 12/2018.

## 2020-09-09 ENCOUNTER — Other Ambulatory Visit: Payer: Self-pay | Admitting: Nurse Practitioner

## 2020-09-10 ENCOUNTER — Other Ambulatory Visit: Payer: Self-pay | Admitting: Nurse Practitioner

## 2020-09-11 ENCOUNTER — Other Ambulatory Visit: Payer: Self-pay | Admitting: Nurse Practitioner

## 2020-09-11 ENCOUNTER — Other Ambulatory Visit: Payer: Self-pay | Admitting: Pulmonary Disease

## 2020-09-11 DIAGNOSIS — G4701 Insomnia due to medical condition: Secondary | ICD-10-CM

## 2020-09-11 DIAGNOSIS — G4700 Insomnia, unspecified: Secondary | ICD-10-CM

## 2020-09-13 ENCOUNTER — Other Ambulatory Visit: Payer: Self-pay | Admitting: Internal Medicine

## 2020-09-14 ENCOUNTER — Telehealth: Payer: Self-pay | Admitting: Internal Medicine

## 2020-09-14 NOTE — Telephone Encounter (Signed)
Hey Dr Rhea Belton, this pt cancelled her colonoscopy scheduled for tomorrow 11/30 due to being sick (cold). Pt has rescheduled for 12/6

## 2020-09-15 ENCOUNTER — Encounter: Payer: BC Managed Care – PPO | Admitting: Internal Medicine

## 2020-09-16 NOTE — Progress Notes (Signed)
@Patient  ID: , female    DOB: Aug 07, 1976, 44 y.o.   MRN: [id number]  Chief Complaint  Patient presents with  . Follow-up    Needs refill on Lunesta, Ran out so not slept as well.Sob occass.    Referring provider: [id number], MD  HPI: 44 year old female, never smoked.  Past medical history significant for insomnia, migraine headache, depressive disorder.  Patient of Dr. 45, last seen in office in December 2020.  Maintained on trazodone and Lunesta.  Patient encouraged to increase physical activity.  09/17/2020 Presents today for annual follow-up/needs medication refill. She is sleeping better since her husband has stopped waking up in the middle of the night for his job. She ran of lunesta and trazodone several days ago and has had a harder time falling/staying asleep. Without sleep aid it takes her 2 hours to fall asleep and she wakes up every couple of hours during the night. When taking sleep aid she falls asleep within an hour, she will read before to bed. No issues fallings asleep, she will wake up 1-2 times at night depending on her husband/kids. She does not notice any grogginess from medication the next day but she still reports persistent fatigue. She did not have sleep apnea on HST in September 2020, weight has stayed relatively the same. She has had previous success with weight watchers in the past.     No Known Allergies  Immunization History  Administered Date(s) Administered  . Influenza Split 08/22/2012, 07/24/2013  . Influenza Whole 09/14/2009  . Influenza,inj,Quad PF,6+ Mos 08/06/2014, 08/21/2015, 07/07/2017, 09/11/2018, 09/02/2020  . Influenza,inj,quad, With Preservative 08/18/2015  . Influenza-Unspecified 07/07/2017  . Moderna SARS-COVID-2 Vaccination 12/04/2019, 01/01/2020  . Td 07/16/2008  . Tdap 02/01/2014    Past Medical History:  Diagnosis Date  . Allergic rhinitis due to dust   . Allergy   . Breech presentation 01/29/2014  . Female  infertility   . Headache(784.0)   . Pre-eclampsia 01/29/2014  . Pregnancy induced hypertension   . Status post primary low transverse cesarean section 02/01/2014    Tobacco History: Social History   Tobacco Use  Smoking Status Never Smoker  Smokeless Tobacco Never Used   Counseling given: Not Answered   Outpatient Medications Prior to Visit  Medication Sig Dispense Refill  . Biotin (BIOTIN 5000) 5 MG CAPS Take 1 capsule by mouth daily.    02/03/2014 buPROPion (WELLBUTRIN XL) 300 MG 24 hr tablet TAKE 1 TABLET BY MOUTH EVERY MORNING 30 tablet 2  . cetirizine (ZYRTEC) 10 MG tablet Take 10 mg by mouth daily.    . Cholecalciferol (VITAMIN D3) 2000 units TABS Take 1 tablet by mouth daily.    . clobetasol cream (TEMOVATE) 0.05 % APPLY TOPICALLY TO THE AFFECTED AREA TWICE DAILY FOR 14 DAYS    . DULoxetine (CYMBALTA) 30 MG capsule TAKE ONE CAPSULE BY MOUTH EVERY DAY ALONG WITH 60MG  CAPSULE 90 capsule 0  . DULoxetine (CYMBALTA) 60 MG capsule Take 1 capsule (60 mg total) by mouth daily. Please schedule physical with labs for further refills. [phone] 90 capsule 0  . ketoconazole (NIZORAL) 2 % shampoo APPLY TOPICALLY 2 TO 3 TIMES A WEEK    . metFORMIN (GLUCOPHAGE) 850 MG tablet Take 1 tablet (850 mg total) by mouth 2 (two) times daily with a meal. 180 tablet 3  . MILI 0.25-35 MG-MCG tablet     . polyethylene glycol (MIRALAX) 17 g packet Take 17 g by mouth daily. 14 each 0  .  spironolactone (ALDACTONE) 100 MG tablet Take 100 mg by mouth 2 (two) times daily.    . SUMAtriptan (IMITREX) 100 MG tablet TAKE 1 TABLET MOUTH IMMEDIATELY AND MAY REPEAT IN 2 HOURS--Please schedule labs for refills. 15 tablet 0  . SUTAB [id number] MG TABS TAKE 24 TABLETS BY MOUTH AS DIRECTED 24 tablet 0  . triamcinolone cream (KENALOG) 0.1 % triamcinolone acetonide 0.1 % topical cream  APPLY EXTERNALLY TO THE AFFECTED AREA TWICE DAILY FOR 15 DAYS    . zinc gluconate 50 MG tablet Take 50 mg by mouth daily.    . traZODone  (DESYREL) 50 MG tablet TAKE 1/2 TO 1 TABLET BY MOUTH AT BEDTIME AS DIRECTED 90 tablet 1  . eszopiclone (LUNESTA) 2 MG TABS tablet TAKE 1 TABLET BY MOUTH EVERY NIGHT AT BEDTIME AS NEEDED FOR SLEEP (Patient not taking: Reported on 09/17/2020) 30 tablet 2   No facility-administered medications prior to visit.   Review of Systems  Review of Systems  Constitutional: Positive for fatigue.  Respiratory: Negative.   Cardiovascular: Negative.   Psychiatric/Behavioral: Positive for sleep disturbance.   Physical Exam  BP 130/88 (BP Location: Right Arm, Cuff Size: Normal)   Pulse 94   Temp (!) 97.2 F (36.2 C) (Temporal)   Ht 5' 7.5" (1.715 m)   Wt 207 lb 9.6 oz (94.2 kg)   SpO2 100%   BMI 32.03 kg/m  Physical Exam Constitutional:      Appearance: Normal appearance.  HENT:     Head: Normocephalic and atraumatic.     Mouth/Throat:     Mouth: Mucous membranes are moist.     Pharynx: Oropharynx is clear.  Cardiovascular:     Rate and Rhythm: Normal rate and regular rhythm.  Pulmonary:     Effort: Pulmonary effort is normal.     Breath sounds: Normal breath sounds.     Comments: CTA Musculoskeletal:        General: Normal range of motion.     Cervical back: Normal range of motion and neck supple.  Skin:    General: Skin is warm and dry.  Neurological:     General: No focal deficit present.     Mental Status: She is alert and oriented to person, place, and time. Mental status is at baseline.  Psychiatric:        Mood and Affect: Mood normal.        Behavior: Behavior normal.        Thought Content: Thought content normal.        Judgment: Judgment normal.      Lab Results:  CBC    Component Value Date/Time   WBC 8.0 07/23/2020 0844   RBC 4.56 07/23/2020 0844   HGB 13.2 07/23/2020 0844   HGB 13.4 07/02/2013 0000   HCT 40.0 07/23/2020 0844   HCT 40 07/02/2013 0000   PLT 331 07/23/2020 0844   MCV 87.7 07/23/2020 0844   MCH 28.9 07/23/2020 0844   MCHC 33.0 07/23/2020  0844   RDW 12.8 07/23/2020 0844   LYMPHSABS 2,200 07/23/2020 0844   MONOABS 0.3 11/05/2018 1004   EOSABS 240 07/23/2020 0844   BASOSABS 56 07/23/2020 0844    BMET    Component Value Date/Time   NA 137 07/23/2020 0844   K 4.8 07/23/2020 0844   CL 103 07/23/2020 0844   CO2 26 07/23/2020 0844   GLUCOSE 115 (H) 07/23/2020 0844   BUN 14 07/23/2020 0844   CREATININE 1.03 07/23/2020 0844   CALCIUM  9.5 07/23/2020 0844   GFRNONAA 67 07/23/2020 0844   GFRAA 78 07/23/2020 0844    BNP No results found for: BNP  ProBNP No results found for: PROBNP  Imaging: No results found.   Assessment & Plan:   Insomnia - Long standing hx of insomnia. Improved on Lunesta and Trazodone. She is taking medication appropriately. No residual effects from medication during the day. Continues to have chronic fatigue symptoms. Home sleep study in 2020 showed no significant evidence of OSA. Continue to encourage patient to lose weight and increase physical activity. Advised patient not to drive if drowsy or combine sleep aid with alcohol. Refills sent to pharmacy. FU in 6 months with Dr. Wynona Neat.     Glenford Bayley, NP 09/17/2020

## 2020-09-17 ENCOUNTER — Encounter: Payer: Self-pay | Admitting: Nurse Practitioner

## 2020-09-17 ENCOUNTER — Ambulatory Visit: Payer: BC Managed Care – PPO | Admitting: Primary Care

## 2020-09-17 ENCOUNTER — Encounter: Payer: Self-pay | Admitting: Primary Care

## 2020-09-17 ENCOUNTER — Other Ambulatory Visit: Payer: Self-pay

## 2020-09-17 DIAGNOSIS — F5104 Psychophysiologic insomnia: Secondary | ICD-10-CM

## 2020-09-17 DIAGNOSIS — G4701 Insomnia due to medical condition: Secondary | ICD-10-CM | POA: Diagnosis not present

## 2020-09-17 DIAGNOSIS — G4700 Insomnia, unspecified: Secondary | ICD-10-CM

## 2020-09-17 MED ORDER — ESZOPICLONE 2 MG PO TABS: ORAL_TABLET | ORAL | 5 refills | Status: DC

## 2020-09-17 MED ORDER — TRAZODONE HCL 50 MG PO TABS
ORAL_TABLET | ORAL | 1 refills | Status: DC
Start: 2020-09-17 — End: 2020-12-14

## 2020-09-17 NOTE — Patient Instructions (Signed)
Pleasure meeting you today Sandra Hudson   Recommendations: - Continue to work on weight loss efforts and increasing physical activity - Do not drive if drowsy - Do not combine sleep aid with alcohol  - Look into plastics with [facility], if you need referral contact PCP  Rx: Refill Lunesta and Trazodone  Follow-up: 6 months with Dr. Wynona Neat (televisit ok)   Insomnia Insomnia is a sleep disorder that makes it difficult to fall asleep or stay asleep. Insomnia can cause fatigue, low energy, difficulty concentrating, mood swings, and poor performance at work or school. There are three different ways to classify insomnia:  Difficulty falling asleep.  Difficulty staying asleep.  Waking up too early in the morning. Any type of insomnia can be long-term (chronic) or short-term (acute). Both are common. Short-term insomnia usually lasts for three months or less. Chronic insomnia occurs at least three times a week for longer than three months. What are the causes? Insomnia may be caused by another condition, situation, or substance, such as:  Anxiety.  Certain medicines.  Gastroesophageal reflux disease (GERD) or other gastrointestinal conditions.  Asthma or other breathing conditions.  Restless legs syndrome, sleep apnea, or other sleep disorders.  Chronic pain.  Menopause.  Stroke.  Abuse of alcohol, tobacco, or illegal drugs.  Mental health conditions, such as depression.  Caffeine.  Neurological disorders, such as Alzheimer's disease.  An overactive thyroid (hyperthyroidism). Sometimes, the cause of insomnia may not be known. What increases the risk? Risk factors for insomnia include:  Gender. Women are affected more often than men.  Age. Insomnia is more common as you get older.  Stress.  Lack of exercise.  Irregular work schedule or working night shifts.  Traveling between different time zones.  Certain medical and mental health conditions. What are the  signs or symptoms? If you have insomnia, the main symptom is having trouble falling asleep or having trouble staying asleep. This may lead to other symptoms, such as:  Feeling fatigued or having low energy.  Feeling nervous about going to sleep.  Not feeling rested in the morning.  Having trouble concentrating.  Feeling irritable, anxious, or depressed. How is this diagnosed? This condition may be diagnosed based on:  Your symptoms and medical history. Your health care provider may ask about: ? Your sleep habits. ? Any medical conditions you have. ? Your mental health.  A physical exam. How is this treated? Treatment for insomnia depends on the cause. Treatment may focus on treating an underlying condition that is causing insomnia. Treatment may also include:  Medicines to help you sleep.  Counseling or therapy.  Lifestyle adjustments to help you sleep better. Follow these instructions at home: Eating and drinking   Limit or avoid alcohol, caffeinated beverages, and cigarettes, especially close to bedtime. These can disrupt your sleep.  Do not eat a large meal or eat spicy foods right before bedtime. This can lead to digestive discomfort that can make it hard for you to sleep. Sleep habits   Keep a sleep diary to help you and your health care provider figure out what could be causing your insomnia. Write down: ? When you sleep. ? When you wake up during the night. ? How well you sleep. ? How rested you feel the next day. ? Any side effects of medicines you are taking. ? What you eat and drink.  Make your bedroom a dark, comfortable place where it is easy to fall asleep. ? Put up shades or blackout curtains  to block light from outside. ? Use a white noise machine to block noise. ? Keep the temperature cool.  Limit screen use before bedtime. This includes: ? Watching TV. ? Using your smartphone, tablet, or computer.  Stick to a routine that includes going to bed  and waking up at the same times every day and night. This can help you fall asleep faster. Consider making a quiet activity, such as reading, part of your nighttime routine.  Try to avoid taking naps during the day so that you sleep better at night.  Get out of bed if you are still awake after 15 minutes of trying to sleep. Keep the lights down, but try reading or doing a quiet activity. When you feel sleepy, go back to bed. General instructions  Take over-the-counter and prescription medicines only as told by your health care provider.  Exercise regularly, as told by your health care provider. Avoid exercise starting several hours before bedtime.  Use relaxation techniques to manage stress. Ask your health care provider to suggest some techniques that may work well for you. These may include: ? Breathing exercises. ? Routines to release muscle tension. ? Visualizing peaceful scenes.  Make sure that you drive carefully. Avoid driving if you feel very sleepy.  Keep all follow-up visits as told by your health care provider. This is important. Contact a health care provider if:  You are tired throughout the day.  You have trouble in your daily routine due to sleepiness.  You continue to have sleep problems, or your sleep problems get worse. Get help right away if:  You have serious thoughts about hurting yourself or someone else. If you ever feel like you may hurt yourself or others, or have thoughts about taking your own life, get help right away. You can go to your nearest emergency department or call:  Your local emergency services (911 in the [place]  A suicide crisis helpline, such as the National Suicide Prevention Lifeline at 920-566-2429. This is open 24 hours a day. Summary  Insomnia is a sleep disorder that makes it difficult to fall asleep or stay asleep.  Insomnia can be long-term (chronic) or short-term (acute).  Treatment for insomnia depends on the cause.  Treatment may focus on treating an underlying condition that is causing insomnia.  Keep a sleep diary to help you and your health care provider figure out what could be causing your insomnia. This information is not intended to replace advice given to you by your health care provider. Make sure you discuss any questions you have with your health care provider. Document Revised: 09/15/2017 Document Reviewed: 07/13/2017 Elsevier Patient Education  2020 [employer].

## 2020-09-17 NOTE — Telephone Encounter (Signed)
Will sign off. Medication already refilled today 09/17/20.

## 2020-09-17 NOTE — Assessment & Plan Note (Addendum)
-   Long standing hx of insomnia. Improved on Lunesta and Trazodone. She is taking medication appropriately. No residual effects from medication during the day. Continues to have chronic fatigue symptoms. Home sleep study in 2020 showed no significant evidence of OSA. Continue to encourage patient to lose weight and increase physical activity. Advised patient not to drive if drowsy or combine sleep aid with alcohol. Refills sent to pharmacy. FU in 6 months with Dr. Wynona Neat.

## 2020-09-21 ENCOUNTER — Ambulatory Visit (AMBULATORY_SURGERY_CENTER): Payer: BC Managed Care – PPO | Admitting: Internal Medicine

## 2020-09-21 ENCOUNTER — Encounter: Payer: Self-pay | Admitting: Internal Medicine

## 2020-09-21 ENCOUNTER — Other Ambulatory Visit: Payer: Self-pay

## 2020-09-21 VITALS — BP 111/75 | HR 72 | Temp 97.8°F | Resp 13 | Ht 67.0 in | Wt 208.0 lb

## 2020-09-21 DIAGNOSIS — K625 Hemorrhage of anus and rectum: Secondary | ICD-10-CM

## 2020-09-21 DIAGNOSIS — K648 Other hemorrhoids: Secondary | ICD-10-CM | POA: Diagnosis not present

## 2020-09-21 DIAGNOSIS — K644 Residual hemorrhoidal skin tags: Secondary | ICD-10-CM | POA: Diagnosis not present

## 2020-09-21 MED ORDER — SODIUM CHLORIDE 0.9 % IV SOLN: 500.0000 mL | Freq: Once | INTRAVENOUS | Status: DC

## 2020-09-21 NOTE — Op Note (Signed)
[facility] Patient Name: Sandra Hudson Procedure Date: 09/21/2020 8:59 AM MRN: [id number] Endoscopist: Beverley Fiedler , MD Age: 44 Referring MD:  Date of Birth: 12-Sep-1976 Gender: Female Account #: [id number] Procedure:                Colonoscopy Indications:              Rectal bleeding Medicines:                Monitored Anesthesia Care Procedure:                Pre-Anesthesia Assessment:                           - Prior to the procedure, a History and Physical                            was performed, and patient medications and                            allergies were reviewed. The patient's tolerance of                            previous anesthesia was also reviewed. The risks                            and benefits of the procedure and the sedation                            options and risks were discussed with the patient.                            All questions were answered, and informed consent                            was obtained. Prior Anticoagulants: The patient has                            taken no previous anticoagulant or antiplatelet                            agents. ASA Grade Assessment: II - A patient with                            mild systemic disease. After reviewing the risks                            and benefits, the patient was deemed in                            satisfactory condition to undergo the procedure.                           After obtaining informed consent, the colonoscope  was passed under direct vision. Throughout the                            procedure, the patient's blood pressure, pulse, and                            oxygen saturations were monitored continuously. The                            adult colonoscope was introduced through the anus                            and advanced to the colocolonic anastomosis created                            status post left colectomy. The colonoscopy was                             performed without difficulty. The patient tolerated                            the procedure well. The quality of the bowel                            preparation was good. The ileocecal valve,                            appendiceal orifice, and rectum were photographed.                            The bowel preparation used was Miralax and SUPREP                            (2 day) via split dose instruction. Scope In: 9:09:38 AM Scope Out: 9:33:17 AM Scope Withdrawal Time: 0 hours 15 minutes 0 seconds  Total Procedure Duration: 0 hours 23 minutes 39 seconds  Findings:                 Skin tags were found on perianal exam.                           The colon (entire examined portion) appeared                            normal, though is redundant.                           Internal hemorrhoids were found during                            retroflexion. The hemorrhoids were small. Complications:            No immediate complications. Estimated Blood Loss:     Estimated blood loss: none. Impression:               - The  entire examined colon is normal.                           - Internal hemorrhoids with perianal skin tags.                           - No specimens collected. Recommendation:           - Patient has a contact number available for                            emergencies. The signs and symptoms of potential                            delayed complications were discussed with the                            patient. Return to normal activities tomorrow.                            Written discharge instructions were provided to the                            patient.                           - Resume previous diet.                           - Continue present medications.                           - MiraLax 17 g daily is recommended to help with                            chronic constipation and hard stools which                            exacerbate  hemorrhoids and also predispose to anal                            fissure. If MiraLax is not helpful and                            constipation/hard stools persist then we can                            consider prescription daily laxative for chronic                            constipation (Trulance, Linzess or Amitiza). If                            constipation continues to be an issue despite  MiraLax please call my office.                           - Repeat colonoscopy in 10 years for screening                            purposes. Beverley Fiedler, MD 09/21/2020 9:39:24 AM This report has been signed electronically.

## 2020-09-21 NOTE — Progress Notes (Signed)
Pt's states no medical or surgical changes since previsit or office visit. 

## 2020-09-21 NOTE — Progress Notes (Signed)
To PACU, Vss. Report to [email or web address] 

## 2020-09-21 NOTE — Patient Instructions (Signed)
YOU HAD AN ENDOSCOPIC PROCEDURE TODAY AT THE [facility]:   Refer to the procedure report that was given to you for any specific questions about what was found during the examination.  If the procedure report does not answer your questions, please call your gastroenterologist to clarify.  If you requested that your care partner not be given the details of your procedure findings, then the procedure report has been included in a sealed envelope for you to review at your convenience later.  YOU SHOULD EXPECT: Some feelings of bloating in the abdomen. Passage of more gas than usual.  Walking can help get rid of the air that was put into your GI tract during the procedure and reduce the bloating. If you had a lower endoscopy (such as a colonoscopy or flexible sigmoidoscopy) you may notice spotting of blood in your stool or on the toilet paper. If you underwent a bowel prep for your procedure, you may not have a normal bowel movement for a few days.  Please Note:  You might notice some irritation and congestion in your nose or some drainage.  This is from the oxygen used during your procedure.  There is no need for concern and it should clear up in a day or so.  SYMPTOMS TO REPORT IMMEDIATELY:   Following lower endoscopy (colonoscopy or flexible sigmoidoscopy):  Excessive amounts of blood in the stool  Significant tenderness or worsening of abdominal pains  Swelling of the abdomen that is new, acute  Fever of 100F or higher  For urgent or emergent issues, a gastroenterologist can be reached at any hour by calling [phone]. Do not use MyChart messaging for urgent concerns.    DIET:  We do recommend a small meal at first, but then you may proceed to your regular diet.  Drink plenty of fluids but you should avoid alcoholic beverages for 24 hours.  ACTIVITY:  You should plan to take it easy for the rest of today and you should NOT DRIVE or use heavy machinery until tomorrow (because  of the sedation medicines used during the test).    FOLLOW UP: Our staff will call the number listed on your records 48-72 hours following your procedure to check on you and address any questions or concerns that you may have regarding the information given to you following your procedure. If we do not reach you, we will leave a message.  We will attempt to reach you two times.  During this call, we will ask if you have developed any symptoms of COVID 19. If you develop any symptoms (ie: fever, flu-like symptoms, shortness of breath, cough etc.) before then, please call [phone].  If you test positive for Covid 19 in the 2 weeks post procedure, please call and report this information to us.    If any biopsies were taken you will be contacted by phone or by letter within the next 1-3 weeks.  Please call us at [phone] if you have not heard about the biopsies in 3 weeks.    SIGNATURES/CONFIDENTIALITY: You and/or your care partner have signed paperwork which will be entered into your electronic medical record.  These signatures attest to the fact that that the information above on your After Visit Summary has been reviewed and is understood.  Full responsibility of the confidentiality of this discharge information lies with you and/or your care-partner. 

## 2020-09-23 ENCOUNTER — Telehealth: Payer: Self-pay

## 2020-09-23 NOTE — Telephone Encounter (Signed)
Left message on answering machine. 

## 2020-09-28 ENCOUNTER — Other Ambulatory Visit: Payer: Self-pay | Admitting: Internal Medicine

## 2020-10-08 ENCOUNTER — Telehealth: Payer: BC Managed Care – PPO | Admitting: Internal Medicine

## 2020-10-12 ENCOUNTER — Other Ambulatory Visit: Payer: Self-pay | Admitting: Internal Medicine

## 2020-11-11 ENCOUNTER — Other Ambulatory Visit: Payer: Self-pay | Admitting: Internal Medicine

## 2020-11-12 ENCOUNTER — Other Ambulatory Visit: Payer: Self-pay

## 2020-11-12 MED ORDER — BUPROPION HCL ER (XL) 300 MG PO TB24: 300.0000 mg | ORAL_TABLET | Freq: Every morning | ORAL | 2 refills | Status: DC

## 2020-11-13 ENCOUNTER — Other Ambulatory Visit: Payer: Self-pay | Admitting: Internal Medicine

## 2020-11-23 NOTE — Addendum Note (Signed)
Addended by: Lerry Liner on: 11/23/2020 09:46 AM   Modules accepted: Orders

## 2020-11-26 ENCOUNTER — Other Ambulatory Visit: Payer: Self-pay | Admitting: Internal Medicine

## 2020-12-03 ENCOUNTER — Other Ambulatory Visit: Payer: Self-pay

## 2020-12-03 ENCOUNTER — Other Ambulatory Visit (INDEPENDENT_AMBULATORY_CARE_PROVIDER_SITE_OTHER): Payer: 59

## 2020-12-03 DIAGNOSIS — Z8632 Personal history of gestational diabetes: Secondary | ICD-10-CM

## 2020-12-03 DIAGNOSIS — E785 Hyperlipidemia, unspecified: Secondary | ICD-10-CM | POA: Diagnosis not present

## 2020-12-03 DIAGNOSIS — R739 Hyperglycemia, unspecified: Secondary | ICD-10-CM

## 2020-12-03 LAB — LIPID PANEL
Cholesterol: 232 mg/dL — ABNORMAL HIGH (ref 0–200)
HDL: 66.1 mg/dL (ref >39.00)
NonHDL: 165.41
Total CHOL/HDL Ratio: 4
Triglycerides: 230 mg/dL — ABNORMAL HIGH (ref 0.0–149.0)
VLDL: 46 mg/dL — ABNORMAL HIGH (ref 0.0–40.0)

## 2020-12-03 LAB — LDL CHOLESTEROL, DIRECT: Direct LDL: 144 mg/dL

## 2020-12-03 LAB — GLUCOSE, RANDOM: Glucose, Bld: 104 mg/dL — ABNORMAL HIGH (ref 70–99)

## 2020-12-03 LAB — HEMOGLOBIN A1C: Hgb A1c MFr Bld: 6.1 % (ref 4.6–6.5)

## 2020-12-06 NOTE — Progress Notes (Signed)
Lipids  improved  blood sugar stable will discuss at upcoming visit

## 2020-12-06 NOTE — Progress Notes (Signed)
Virtual Visit via Video Note  I connected with@ on 12/07/20 at  8:30 AM EST by a video enabled telemedicine application and verified that I am speaking with the correct person using two identifiers. Location patient: home Location provider:work office Persons participating in the virtual visit: patient, provider  [place] national recommendations  regarding COVID 19 pandemic   video visit is advised over in office visit for this patient.  Patient aware  of the limitations of evaluation and management by telemedicine and  availability of in person appointments. and agreed to proceed.   HPI: EVVIE Hudson presents for video visit follow-up of increasing Metformin medication follow-up of blood sugar prediabetic risk and hyperlipidemia  She is tolerating the Metformin twice daily without difficulty no complaints. Was working on lipids some difficulty in [employer] season. Depression up-and-down not bad today working on it. No new major change in health.  ROS: See pertinent positives and negatives per HPI.  Past Medical History:  Diagnosis Date  . Allergic rhinitis due to dust   . Allergy   . Breech presentation 01/29/2014  . Female infertility   . Headache(784.0)   . Pre-eclampsia 01/29/2014  . Pregnancy induced hypertension   . Status post primary low transverse cesarean section 02/01/2014    Past Surgical History:  Procedure Laterality Date  . CESAREAN SECTION N/A 01/31/2014   Procedure: CESAREAN SECTION;  Surgeon: Purcell Nails, MD;  Location: [facility];  Service: Obstetrics;  Laterality: N/A;  . COLONOSCOPY  09/16/2011  . NASAL SEPTUM SURGERY     deviated septum and large turbinate    Family History  Problem Relation Age of Onset  . Depression Father   . ALS Father        multiple system atropy  . Depression Mother   . Breast cancer Other        maternal great aunt  . Breast cancer Paternal Grandmother   . Colon cancer Neg Hx   . Pancreatic cancer Neg Hx   .  Esophageal cancer Neg Hx     Social History   Tobacco Use  . Smoking status: Never Smoker  . Smokeless tobacco: Never Used  Vaping Use  . Vaping Use: Never used  Substance Use Topics  . Alcohol use: No    Comment: occasionally  . Drug use: No      Current Outpatient Medications:  .  Biotin 5 MG CAPS, Take 1 capsule by mouth daily., Disp: , Rfl:  .  buPROPion (WELLBUTRIN XL) 300 MG 24 hr tablet, Take 1 tablet (300 mg total) by mouth every morning., Disp: 30 tablet, Rfl: 2 .  cetirizine (ZYRTEC) 10 MG tablet, Take 10 mg by mouth daily., Disp: , Rfl:  .  Cholecalciferol (VITAMIN D3) 2000 units TABS, Take 1 tablet by mouth daily., Disp: , Rfl:  .  clobetasol cream (TEMOVATE) 0.05 %, APPLY TOPICALLY TO THE AFFECTED AREA TWICE DAILY FOR 14 DAYS, Disp: , Rfl:  .  DULoxetine (CYMBALTA) 30 MG capsule, TAKE ONE CAPSULE BY MOUTH EVERY DAY ALONG WITH 60MG  CAPSULE, Disp: 90 capsule, Rfl: 0 .  DULoxetine (CYMBALTA) 60 MG capsule, TAKE 1 CAPSULE BY MOUTH EVERY DAY, Disp: 90 capsule, Rfl: 0 .  eszopiclone (LUNESTA) 2 MG TABS tablet, TAKE 1 TABLET BY MOUTH EVERY NIGHT AT BEDTIME AS NEEDED FOR SLEEP, Disp: 30 tablet, Rfl: 5 .  ketoconazole (NIZORAL) 2 % shampoo, APPLY TOPICALLY 2 TO 3 TIMES A WEEK, Disp: , Rfl:  .  metFORMIN (GLUCOPHAGE) 850  MG tablet, Take 1 tablet (850 mg total) by mouth 2 (two) times daily with a meal., Disp: 180 tablet, Rfl: 3 .  MILI 0.25-35 MG-MCG tablet, , Disp: , Rfl:  .  polyethylene glycol (MIRALAX) 17 g packet, Take 17 g by mouth daily., Disp: 14 each, Rfl: 0 .  spironolactone (ALDACTONE) 100 MG tablet, Take 100 mg by mouth 2 (two) times daily., Disp: , Rfl:  .  SUMAtriptan (IMITREX) 100 MG tablet, TAKE 1 TABLET BY MOUTH IMMEDIATELY AND MAY REPEAT IN 2 HOURS, Disp: 15 tablet, Rfl: 0 .  traZODone (DESYREL) 50 MG tablet, TAKE 1/2 TO 1 TABLET BY MOUTH AT BEDTIME AS DIRECTED, Disp: 90 tablet, Rfl: 1 .  triamcinolone cream (KENALOG) 0.1 %, triamcinolone acetonide 0.1 % topical  cream  APPLY EXTERNALLY TO THE AFFECTED AREA TWICE DAILY FOR 15 DAYS, Disp: , Rfl:  .  zinc gluconate 50 MG tablet, Take 50 mg by mouth daily., Disp: , Rfl:   EXAM: BP Readings from Last 3 Encounters:  09/21/20 111/75  09/17/20 130/88  09/02/20 122/78    VITALS per patient if applicable:  GENERAL: alert, oriented, appears well and in no acute distress  HEENT: atraumatic, conjunttiva clear, no obvious abnormalities on inspection of external nose and ears  NECK: normal movements of the head and neck  LUNGS: on inspection no signs of respiratory distress, breathing rate appears normal, no obvious gross SOB, gasping or wheezing  CV: no obvious cyanosis  MS: moves all visible extremities without noticeable abnormality  PSYCH/NEURO: pleasant and cooperative, no obvious depression or anxiety, speech and thought processing grossly intact Lab Results  Component Value Date   WBC 8.0 07/23/2020   HGB 13.2 07/23/2020   HCT 40.0 07/23/2020   PLT 331 07/23/2020   GLUCOSE 104 (H) 12/03/2020   CHOL 232 (H) 12/03/2020   TRIG 230.0 (H) 12/03/2020   HDL 66.10 12/03/2020   LDLDIRECT 144.0 12/03/2020   LDLCALC 162 (H) 07/23/2020   ALT 14 07/23/2020   AST 14 07/23/2020   NA 137 07/23/2020   K 4.8 07/23/2020   CL 103 07/23/2020   CREATININE 1.03 07/23/2020   BUN 14 07/23/2020   CO2 26 07/23/2020   TSH 2.43 07/23/2020   HGBA1C 6.1 12/03/2020   MICROALBUR 1.1 09/15/2010    ASSESSMENT AND PLAN:  Discussed the following assessment and plan:    ICD-10-CM   1. Hyperglycemia  R73.9 Basic metabolic panel    CBC with Differential/Platelet    Hemoglobin A1c    Hepatic function panel    Lipid panel    TSH    Microalbumin / creatinine urine ratio  2. Hyperlipidemia, unspecified hyperlipidemia type  E78.5 Basic metabolic panel    CBC with Differential/Platelet    Hemoglobin A1c    Hepatic function panel    Lipid panel    TSH    Microalbumin / creatinine urine ratio  3. Medication  management  Z79.899 Basic metabolic panel    CBC with Differential/Platelet    Hemoglobin A1c    Hepatic function panel    Lipid panel    TSH    Microalbumin / creatinine urine ratio  4. History of gestational diabetes  Z86.32 Basic metabolic panel    CBC with Differential/Platelet    Hemoglobin A1c    Hepatic function panel    Lipid panel    TSH    Microalbumin / creatinine urine ratio  5. plan for preventive health examination  Z00.00 Basic metabolic panel  CBC with Differential/Platelet    Hemoglobin A1c    Hepatic function panel    Lipid panel    TSH    Microalbumin / creatinine urine ratio   Lipid panel significantly improved although still not at goal.  Continue intensive lifestyle intervention as possible consideration of statins in the future as indicated A1c blood sugar stable.  Continue Metformin.  Plan fasting labs before CPX in the fall November 22 or earlier if needed. Offered nutrition dietary referral if needed.  Counseled.   Expectant management and discussion of plan and treatment with opportunity to ask questions and all were answered. The patient agreed with the plan and demonstrated an understanding of the instructions.   Advised to call back or seek an in-person evaluation if worsening  or having  further concerns . Return for fasting lab pre dox in Novemeber when due or as needed .    Berniece Andreas, MD

## 2020-12-07 ENCOUNTER — Encounter: Payer: Self-pay | Admitting: Internal Medicine

## 2020-12-07 ENCOUNTER — Telehealth (INDEPENDENT_AMBULATORY_CARE_PROVIDER_SITE_OTHER): Payer: 59 | Admitting: Internal Medicine

## 2020-12-07 DIAGNOSIS — E785 Hyperlipidemia, unspecified: Secondary | ICD-10-CM

## 2020-12-07 DIAGNOSIS — R739 Hyperglycemia, unspecified: Secondary | ICD-10-CM

## 2020-12-07 DIAGNOSIS — Z8632 Personal history of gestational diabetes: Secondary | ICD-10-CM

## 2020-12-07 DIAGNOSIS — Z0000 Encounter for general adult medical examination without abnormal findings: Secondary | ICD-10-CM

## 2020-12-07 DIAGNOSIS — Z79899 Other long term (current) drug therapy: Secondary | ICD-10-CM | POA: Diagnosis not present

## 2020-12-13 ENCOUNTER — Other Ambulatory Visit: Payer: Self-pay | Admitting: Internal Medicine

## 2020-12-14 NOTE — Telephone Encounter (Signed)
Refill request for:  Trazodone 50 mg LR 09/17/20, #90, 0 rf LOV 12/07/20 FOV  None scheduled  Please reveiw and advise.  Thanks.  Dm/cma

## 2021-01-08 ENCOUNTER — Other Ambulatory Visit: Payer: Self-pay | Admitting: Internal Medicine

## 2021-01-18 ENCOUNTER — Other Ambulatory Visit: Payer: Self-pay | Admitting: Internal Medicine

## 2021-02-07 ENCOUNTER — Other Ambulatory Visit: Payer: Self-pay | Admitting: Internal Medicine

## 2021-02-08 ENCOUNTER — Other Ambulatory Visit: Payer: Self-pay | Admitting: Internal Medicine

## 2021-02-23 IMAGING — MG MM DIGITAL DIAGNOSTIC UNILAT*L* W/ TOMO W/ CAD
4 series · 4 of 12 positions shown · non-contrast
Comparison: Previous exam(s).

CLINICAL DATA: The patient was called back for left breast
asymmetry seen on the cc view only.

EXAM:
DIGITAL DIAGNOSTIC LEFT MAMMOGRAM WITH TOMO
ULTRASOUND LEFT BREAST

[L ML synth-2D]
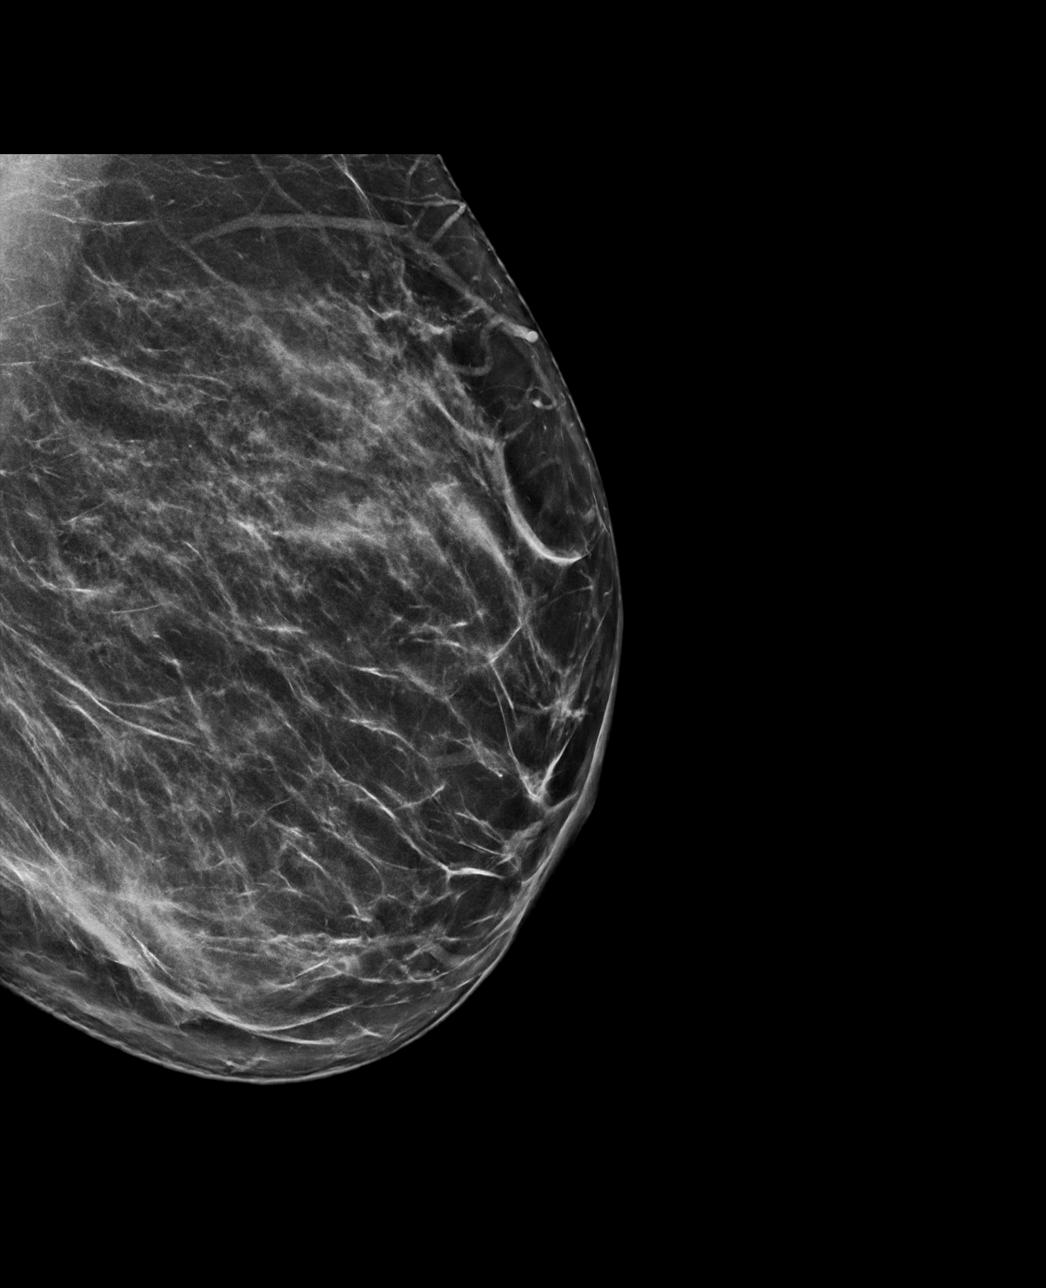

[L CC synth-2D]
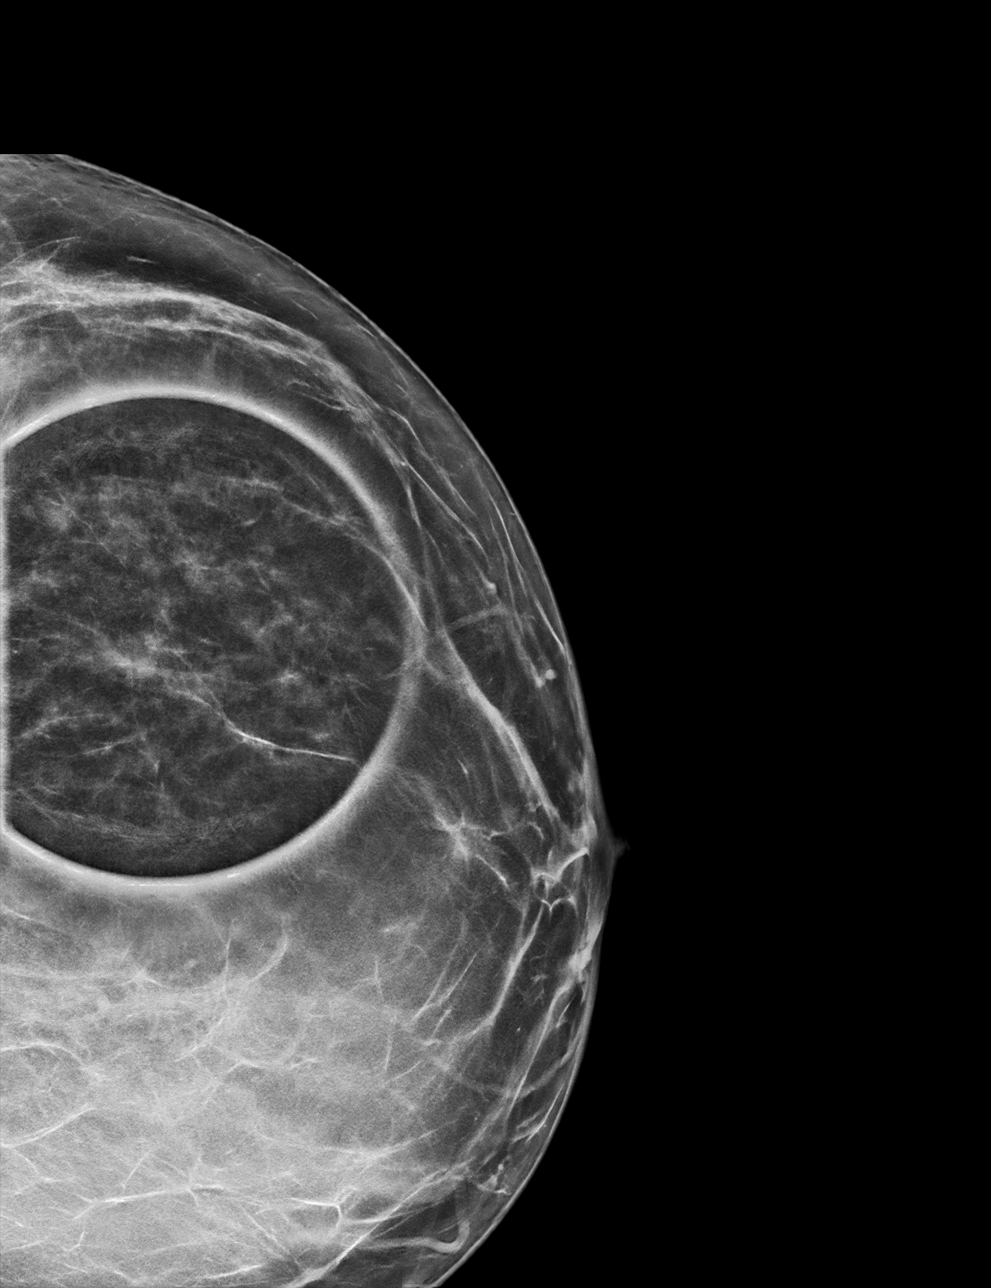

[L ML tomo · tomo slice 43/86.0]
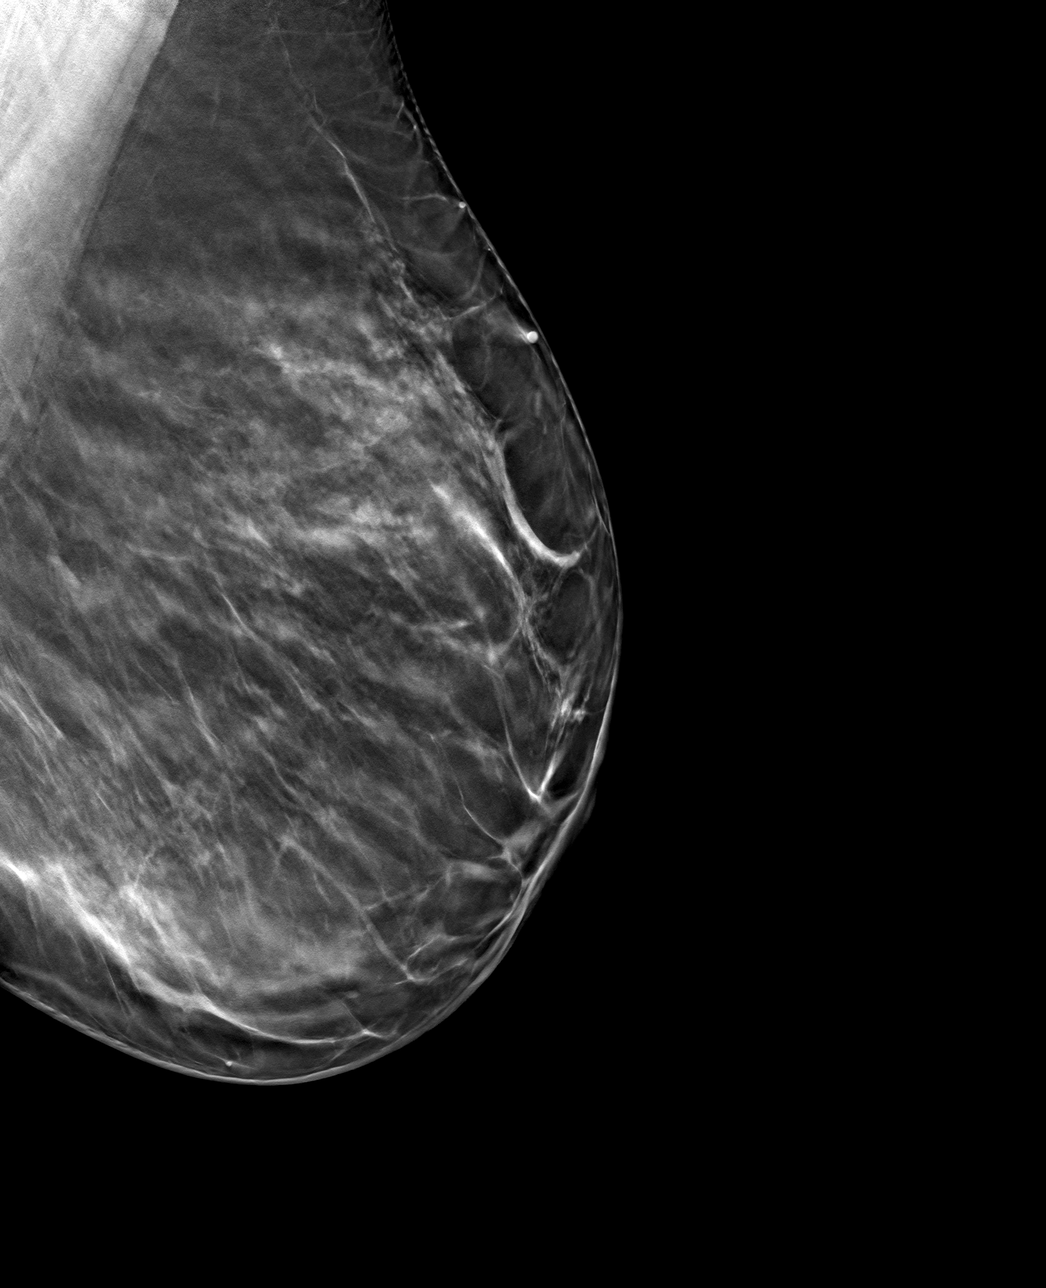

[L CC tomo · tomo slice 34/67.0]
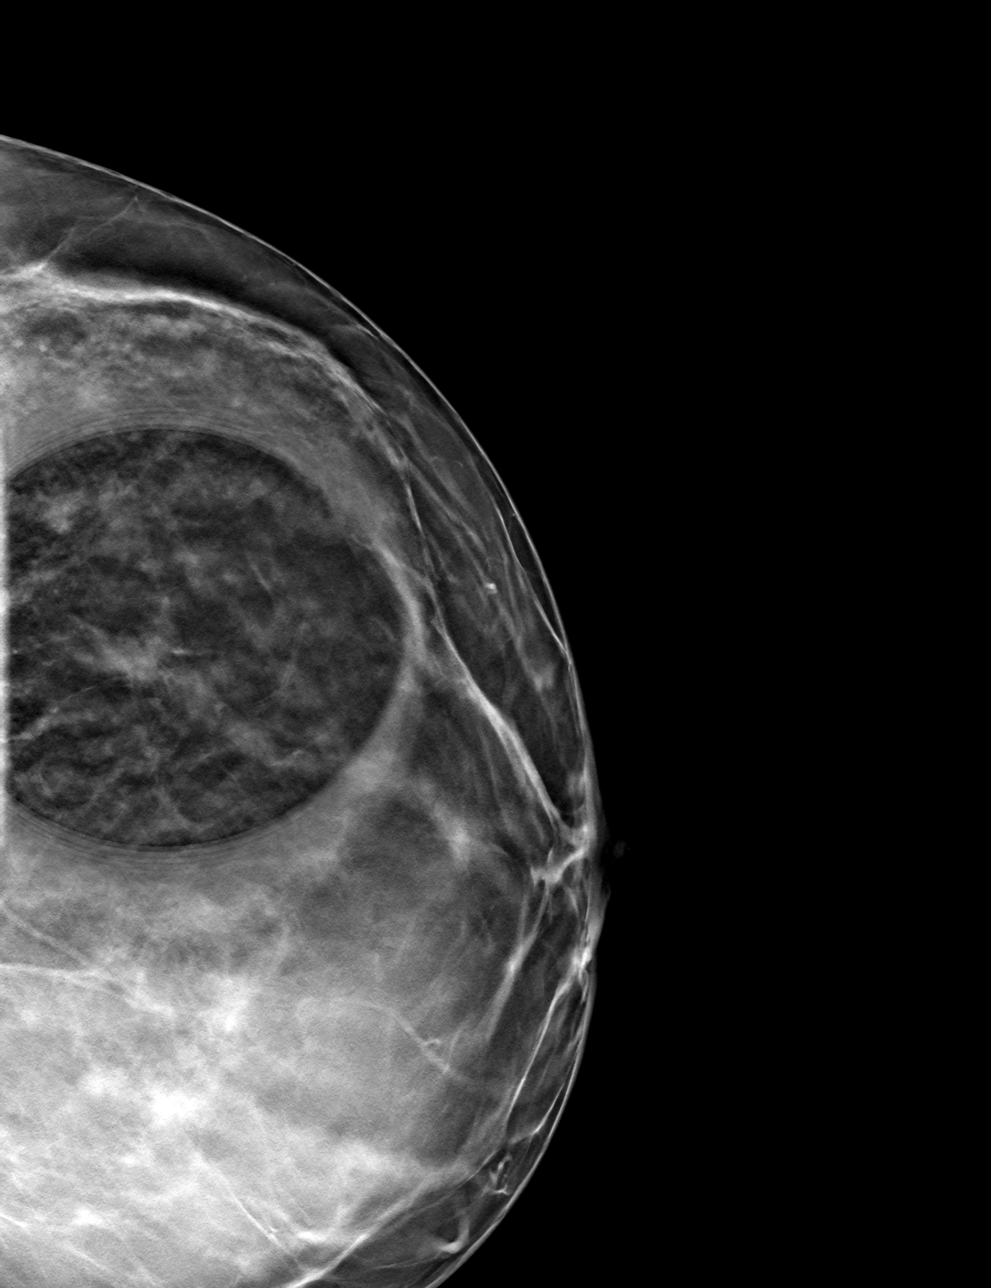

[4 of 12 positions shown; findings below may reference images not displayed]

ACR Breast Density Category b: There are scattered areas of
fibroglandular density.
FINDINGS: The asymmetry in the lateral left breast appears to resolve on
today's imaging.

On physical exam, no suspicious lumps are identified.

Targeted ultrasound is performed, showing no suspicious findings in
the lateral left breast to correlate with the asymmetry seen at
screening mammography.
IMPRESSION: No mammographic or sonographic evidence of malignancy in the lateral
left breast.

RECOMMENDATION:
Annual screening mammography.

I have discussed the findings and recommendations with the patient.
If applicable, a reminder letter will be sent to the patient
regarding the next appointment.

BI-RADS CATEGORY  1: Negative.

## 2021-02-23 IMAGING — US US BREAST*L* LIMITED INC AXILLA
1 series · 4 of 4 positions shown · non-contrast
Comparison: Previous exam(s).

CLINICAL DATA: The patient was called back for left breast
asymmetry seen on the cc view only.

EXAM:
DIGITAL DIAGNOSTIC LEFT MAMMOGRAM WITH TOMO
ULTRASOUND LEFT BREAST

[Series 1: us breast*left* limited inc axilla · 0.08mm/px · 4 of 4 slices shown]
[im 1/4]
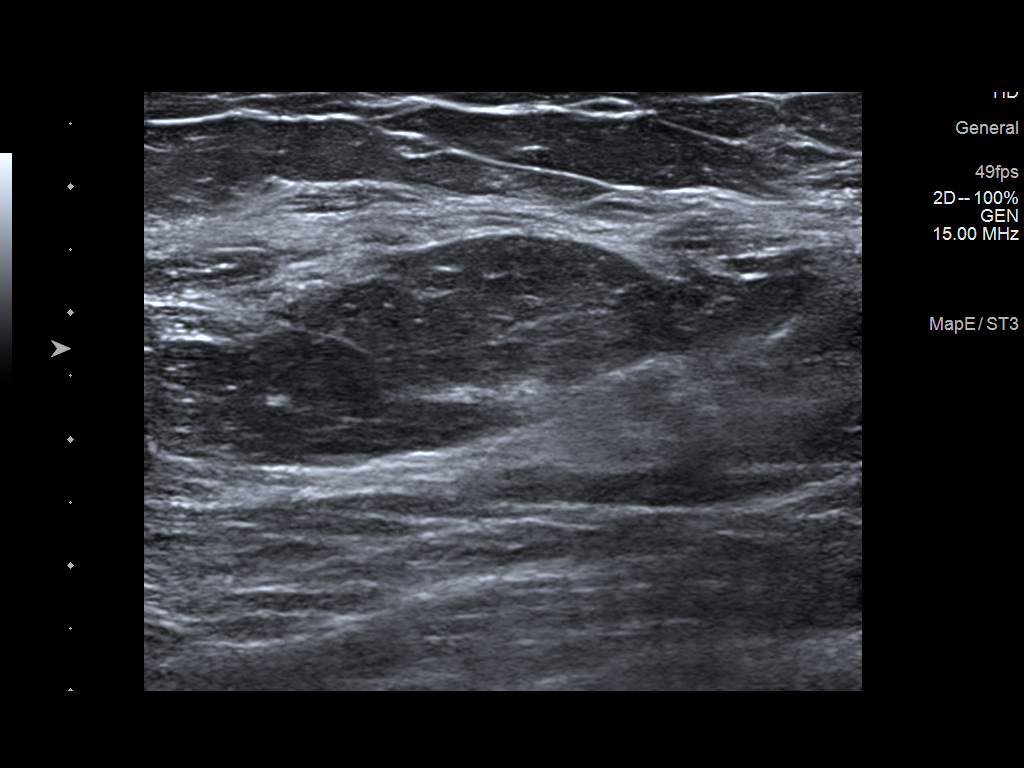
[im 2/4]
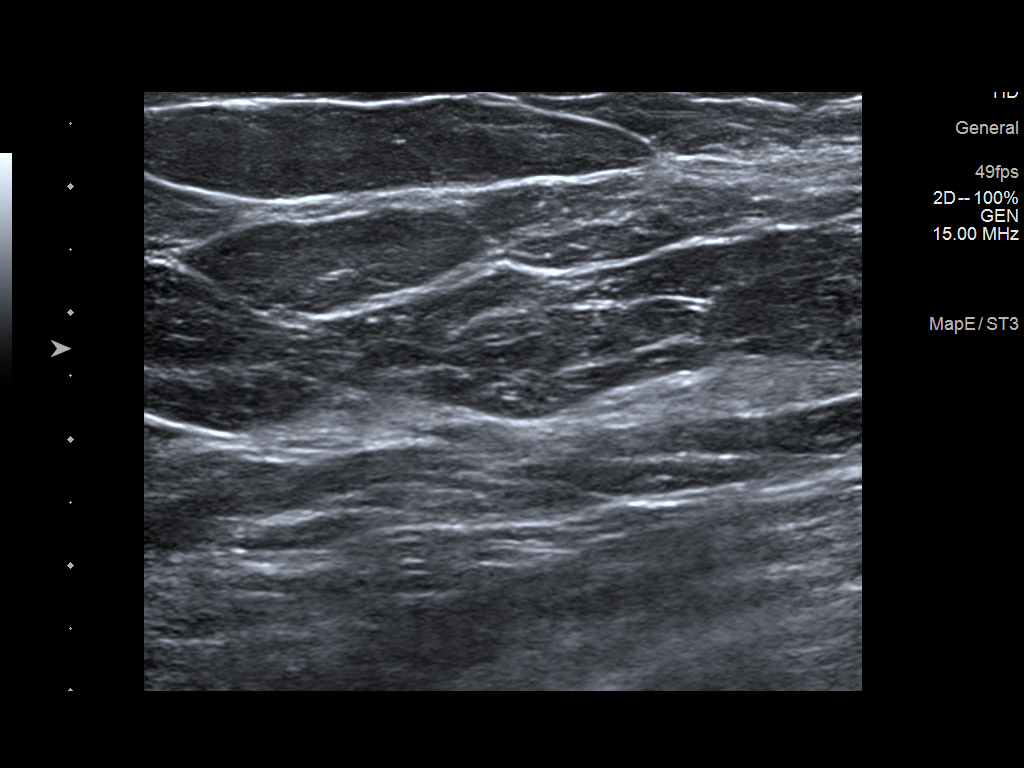
[im 3/4]
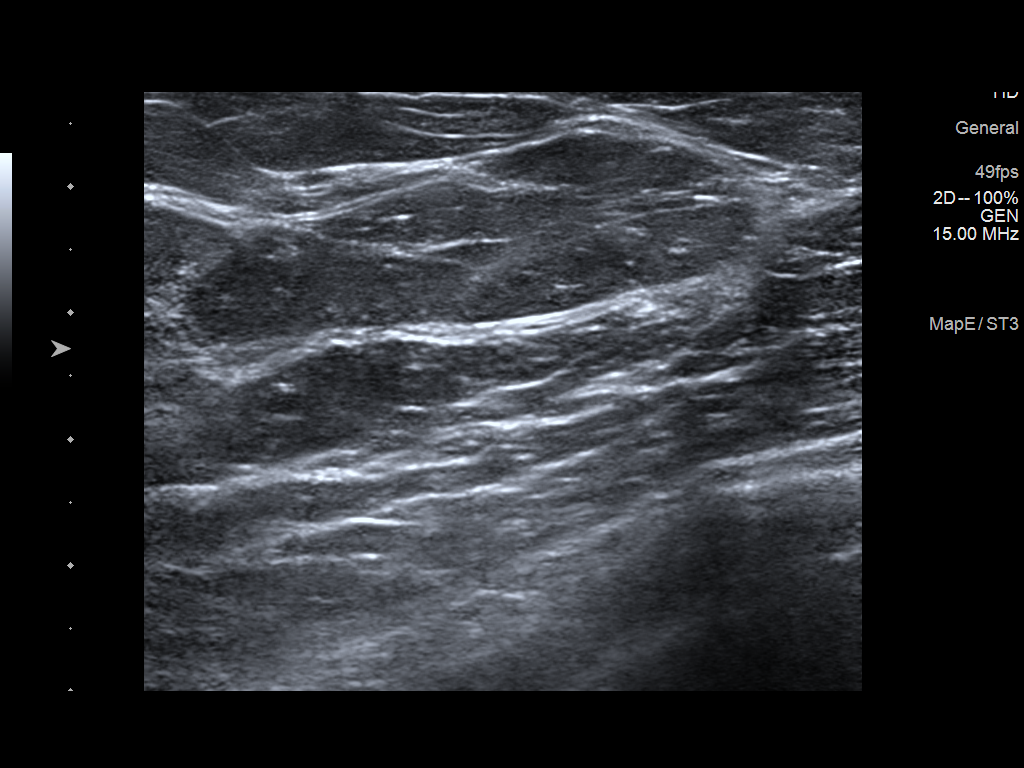
[im 4/4]
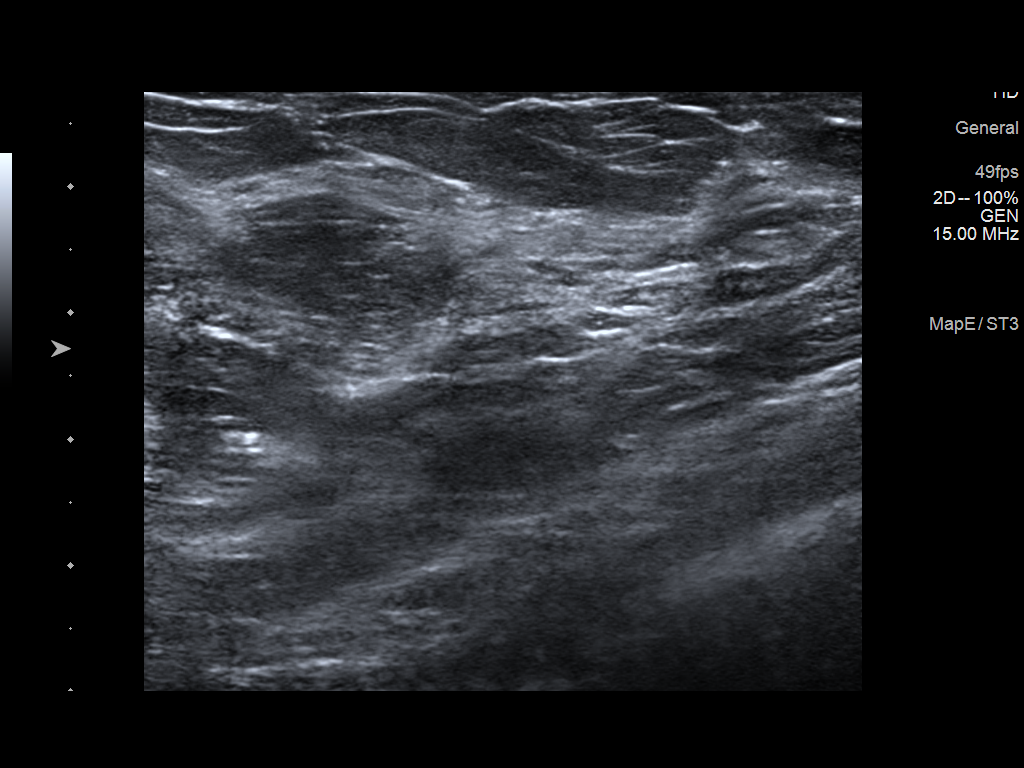

[4 of 4 positions shown; findings below may reference images not displayed]

ACR Breast Density Category b: There are scattered areas of
fibroglandular density.
FINDINGS: The asymmetry in the lateral left breast appears to resolve on
today's imaging.

On physical exam, no suspicious lumps are identified.

Targeted ultrasound is performed, showing no suspicious findings in
the lateral left breast to correlate with the asymmetry seen at
screening mammography.
IMPRESSION: No mammographic or sonographic evidence of malignancy in the lateral
left breast.

RECOMMENDATION:
Annual screening mammography.

I have discussed the findings and recommendations with the patient.
If applicable, a reminder letter will be sent to the patient
regarding the next appointment.

BI-RADS CATEGORY  1: Negative.

## 2021-02-24 ENCOUNTER — Other Ambulatory Visit: Payer: Self-pay | Admitting: Obstetrics and Gynecology

## 2021-02-24 DIAGNOSIS — R928 Other abnormal and inconclusive findings on diagnostic imaging of breast: Secondary | ICD-10-CM

## 2021-02-24 LAB — HM PAP SMEAR: HPV, high-risk: NEGATIVE

## 2021-03-11 ENCOUNTER — Other Ambulatory Visit: Payer: Self-pay | Admitting: Internal Medicine

## 2021-03-18 ENCOUNTER — Other Ambulatory Visit: Payer: Self-pay

## 2021-03-18 ENCOUNTER — Ambulatory Visit (INDEPENDENT_AMBULATORY_CARE_PROVIDER_SITE_OTHER): Payer: 59 | Admitting: Primary Care

## 2021-03-18 ENCOUNTER — Encounter: Payer: Self-pay | Admitting: Primary Care

## 2021-03-18 DIAGNOSIS — G4701 Insomnia due to medical condition: Secondary | ICD-10-CM

## 2021-03-18 DIAGNOSIS — G4700 Insomnia, unspecified: Secondary | ICD-10-CM | POA: Diagnosis not present

## 2021-03-18 MED ORDER — TRAZODONE HCL 50 MG PO TABS: ORAL_TABLET | ORAL | 1 refills | Status: DC

## 2021-03-18 MED ORDER — ESZOPICLONE 2 MG PO TABS: ORAL_TABLET | ORAL | 1 refills | Status: DC

## 2021-03-18 NOTE — Progress Notes (Signed)
@Patient  ID: , female    DOB: 1976/09/19, 45 y.o.   MRN: [id number]  Chief Complaint  Patient presents with  . Follow-up    Pt states she is about the same since last visit. States that she is still having problems with insomnia. Pt states as soon as she takes her meds, it takes her about an hour before she is able to go to sleep as she does not lay down right away after taking it. Pt states if she takes her med, she wakes up once or twice a night but if she does not take her med, she is waking up almost every hour.     Referring provider: [id number], MD  HPI: 45 year old female, never smoked.  Past medical history significant for insomnia, migraine headache, depressive disorder.  Patient of Dr. 55, last seen in office in December 2020.  Maintained on trazodone and Lunesta.  Patient encouraged to increase physical activity.  Previous [facility] pulmonary encounter:  09/17/2020 Presents today for annual follow-up/needs medication refill. She is sleeping better since her husband has stopped waking up in the middle of the night for his job. She ran of lunesta and trazodone several days ago and has had a harder time falling/staying asleep. Without sleep aid it takes her 2 hours to fall asleep and she wakes up every couple of hours during the night. When taking sleep aid she falls asleep within an hour, she will read before to bed. No issues fallings asleep, she will wake up 1-2 times at night depending on her husband/kids. She does not notice any grogginess from medication the next day but she still reports persistent fatigue. She did not have sleep apnea on HST in September 2020, weight has stayed relatively the same. She has had previous success with weight watchers in the past.   03/18/2021- interim hx  Patient presents today for 6 month follow-up. She takes both 05/18/2021 and Trazodone for insomnia. She gets approximately 7.5 hours of sleep a night. Her bedtime is between 10:30-11pm.  She gets out of bed between 6-6:30am. She occasionally forgets to take her sleep aid, if she does not take medication she wakes up every hour. She does snore at night, sleeps on her side. Her husband is not currently sleeping in bed with her d.t back issues. HST in September 2020 showed no significant OSA, average AHI was 4/7/hour. Her weight is relatively the same, up 5-7 lbs. She is open to getting more physical activity. She does not drink alcohol and takes medication appropriately.    No Known Allergies  Immunization History  Administered Date(s) Administered  . Influenza Split 08/22/2012, 07/24/2013  . Influenza Whole 09/14/2009  . Influenza,inj,Quad PF,6+ Mos 08/06/2014, 08/21/2015, 07/07/2017, 09/11/2018, 09/02/2020  . Influenza,inj,quad, With Preservative 08/18/2015  . Influenza-Unspecified 07/07/2017  . Moderna Sars-Covid-2 Vaccination 12/04/2019, 01/01/2020, 11/18/2020  . Td 07/16/2008  . Tdap 02/01/2014    Past Medical History:  Diagnosis Date  . Allergic rhinitis due to dust   . Allergy   . Breech presentation 01/29/2014  . Female infertility   . Headache(784.0)   . Pre-eclampsia 01/29/2014  . Pregnancy induced hypertension   . Status post primary low transverse cesarean section 02/01/2014    Tobacco History: Social History   Tobacco Use  Smoking Status Never Smoker  Smokeless Tobacco Never Used   Counseling given: Not Answered   Outpatient Medications Prior to Visit  Medication Sig Dispense Refill  . Biotin 5 MG CAPS Take 1  capsule by mouth daily.    Marland Kitchen buPROPion (WELLBUTRIN XL) 300 MG 24 hr tablet TAKE 1 TABLET(300 MG) BY MOUTH EVERY MORNING 90 tablet 0  . Cholecalciferol (VITAMIN D3) 2000 units TABS Take 1 tablet by mouth daily.    . clobetasol cream (TEMOVATE) 0.05 % APPLY TOPICALLY TO THE AFFECTED AREA TWICE DAILY FOR 14 DAYS    . DULoxetine (CYMBALTA) 30 MG capsule TAKE 1 CAPSULE BY MOUTH EVERY DAY ALONG WITH 60MG  CAPSULE 90 capsule 0  . DULoxetine  (CYMBALTA) 60 MG capsule TAKE 1 CAPSULE BY MOUTH EVERY DAY 90 capsule 0  . ketoconazole (NIZORAL) 2 % shampoo APPLY TOPICALLY 2 TO 3 TIMES A WEEK    . loratadine (CLARITIN) 10 MG tablet Take 10 mg by mouth daily.    . metFORMIN (GLUCOPHAGE) 850 MG tablet Take 1 tablet (850 mg total) by mouth 2 (two) times daily with a meal. 180 tablet 3  . MILI 0.25-35 MG-MCG tablet     . polyethylene glycol (MIRALAX) 17 g packet Take 17 g by mouth daily. 14 each 0  . spironolactone (ALDACTONE) 100 MG tablet Take 100 mg by mouth 2 (two) times daily.    . SUMAtriptan (IMITREX) 100 MG tablet TAKE 1 TABLET BY MOUTH IMMEDIATELY AND MAY REPEAT IN 2 HOURS 15 tablet 0  . triamcinolone cream (KENALOG) 0.1 % triamcinolone acetonide 0.1 % topical cream  APPLY EXTERNALLY TO THE AFFECTED AREA TWICE DAILY FOR 15 DAYS    . zinc gluconate 50 MG tablet Take 50 mg by mouth daily.    . eszopiclone (LUNESTA) 2 MG TABS tablet TAKE 1 TABLET BY MOUTH EVERY NIGHT AT BEDTIME AS NEEDED FOR SLEEP 30 tablet 5  . traZODone (DESYREL) 50 MG tablet TAKE 1/2 TO 1 TABLET BY MOUTH AT BEDTIME AS DIRECTED 90 tablet 1  . cetirizine (ZYRTEC) 10 MG tablet Take 10 mg by mouth daily.     No facility-administered medications prior to visit.   Review of Systems  Review of Systems  Constitutional: Negative.   Respiratory: Negative.   Psychiatric/Behavioral: Positive for sleep disturbance.    Physical Exam  BP 120/76 (BP Location: Left Arm, Patient Position: Sitting, Cuff Size: Normal)   Pulse (!) 110   Ht 5\' 7"  (1.702 m)   Wt 209 lb 3.2 oz (94.9 kg)   SpO2 99% Comment: RA  BMI 32.77 kg/m  Physical Exam Constitutional:      Appearance: Normal appearance.  HENT:     Head: Normocephalic and atraumatic.     Mouth/Throat:     Mouth: Mucous membranes are moist.     Pharynx: Oropharynx is clear.  Cardiovascular:     Rate and Rhythm: Normal rate and regular rhythm.  Pulmonary:     Effort: Pulmonary effort is normal.     Breath sounds:  Normal breath sounds.  Musculoskeletal:        General: Normal range of motion.  Skin:    General: Skin is warm and dry.  Neurological:     General: No focal deficit present.     Mental Status: She is alert and oriented to person, place, and time. Mental status is at baseline.  Psychiatric:        Mood and Affect: Mood normal.        Behavior: Behavior normal.        Thought Content: Thought content normal.        Judgment: Judgment normal.      Lab Results:  CBC  Component Value Date/Time   WBC 8.0 07/23/2020 0844   RBC 4.56 07/23/2020 0844   HGB 13.2 07/23/2020 0844   HGB 13.4 07/02/2013 0000   HCT 40.0 07/23/2020 0844   HCT 40 07/02/2013 0000   PLT 331 07/23/2020 0844   MCV 87.7 07/23/2020 0844   MCH 28.9 07/23/2020 0844   MCHC 33.0 07/23/2020 0844   RDW 12.8 07/23/2020 0844   LYMPHSABS 2,200 07/23/2020 0844   MONOABS 0.3 11/05/2018 1004   EOSABS 240 07/23/2020 0844   BASOSABS 56 07/23/2020 0844    BMET    Component Value Date/Time   NA 137 07/23/2020 0844   K 4.8 07/23/2020 0844   CL 103 07/23/2020 0844   CO2 26 07/23/2020 0844   GLUCOSE 104 (H) 12/03/2020 0834   BUN 14 07/23/2020 0844   CREATININE 1.03 07/23/2020 0844   CALCIUM 9.5 07/23/2020 0844   GFRNONAA 67 07/23/2020 0844   GFRAA 78 07/23/2020 0844    BNP No results found for: BNP  ProBNP No results found for: PROBNP  Imaging: No results found.   Assessment & Plan:   Insomnia No changes since last visit. Patient is maintained on Lunesta 2mg  and Trazodone 25-50mg  at bedtime for insomnia. She has been on this regimen for quite some time now. Her sleep quality is better on medication. She gets approx 7-8 hours of sleep a night. Recommend she continue to work on weight loss efforts and getting some form of physical activity daily. Advised patient not to drive while taking medication or combine with alcohol. RX for Lunesta and Trazodone sent to pharmacy. FU in 6 months with Dr. .        Wynona Neat, NP 03/18/2021

## 2021-03-18 NOTE — Patient Instructions (Addendum)
Nice seeing you today Ms Bieser  Recommendations: - Continue to use Lunesta and Trazodone as prescribed - Aim to get some form of activity/exercise 3-5 times a week (start off small, walk around your house outside 1-2 laps) - Continue to work on weight loss efforts - If sleep quality worsens may consider repeating sleep study  Follow-up: - 6 months with Dr. Wynona Neat

## 2021-03-18 NOTE — Assessment & Plan Note (Addendum)
No changes since last visit. Patient is maintained on Lunesta 2mg  and Trazodone 25-50mg  at bedtime for insomnia. She has been on this regimen for quite some time now. Her sleep quality is better on medication. She gets approx 7-8 hours of sleep a night. Recommend she continue to work on weight loss efforts and getting some form of physical activity daily. Advised patient not to drive while taking medication or combine with alcohol. RX for Lunesta and Trazodone sent to pharmacy. FU in 6 months with Dr. .

## 2021-03-24 ENCOUNTER — Other Ambulatory Visit: Payer: 59

## 2021-04-01 ENCOUNTER — Other Ambulatory Visit: Payer: 59

## 2021-04-06 ENCOUNTER — Other Ambulatory Visit: Payer: Self-pay | Admitting: Internal Medicine

## 2021-05-06 ENCOUNTER — Other Ambulatory Visit: Payer: Self-pay | Admitting: Internal Medicine

## 2021-05-07 ENCOUNTER — Other Ambulatory Visit: Payer: Self-pay

## 2021-05-07 ENCOUNTER — Ambulatory Visit: Payer: 59

## 2021-05-07 ENCOUNTER — Ambulatory Visit
Admission: RE | Admit: 2021-05-07 | Discharge: 2021-05-07 | Disposition: A | Discharge status: Home / Self Care | Payer: 59 | Source: Ambulatory Visit | Attending: Obstetrics and Gynecology | Admitting: Obstetrics and Gynecology

## 2021-05-07 DIAGNOSIS — R928 Other abnormal and inconclusive findings on diagnostic imaging of breast: Secondary | ICD-10-CM

## 2021-05-14 ENCOUNTER — Other Ambulatory Visit: Payer: Self-pay | Admitting: Internal Medicine

## 2021-06-07 ENCOUNTER — Other Ambulatory Visit: Payer: 59

## 2021-06-08 ENCOUNTER — Telehealth: Payer: Self-pay | Admitting: Internal Medicine

## 2021-06-08 MED ORDER — SUMATRIPTAN SUCCINATE 100 MG PO TABS: ORAL_TABLET | ORAL | 0 refills | Status: DC

## 2021-06-08 NOTE — Telephone Encounter (Signed)
Rx sent to pt's pharmacy

## 2021-06-08 NOTE — Telephone Encounter (Signed)
The patient  is requesting a refill on   SUMAtriptan (IMITREX) 100 MG tablet- delivered to the [id number] - [place], [place] - [address] AT [facility] [address]  Phone:[phone] Fax:  [phone] Pt contact number  [phone]

## 2021-06-14 ENCOUNTER — Other Ambulatory Visit: Payer: 59

## 2021-06-30 ENCOUNTER — Ambulatory Visit: Payer: 59

## 2021-06-30 ENCOUNTER — Ambulatory Visit
Admission: RE | Admit: 2021-06-30 | Discharge: 2021-06-30 | Disposition: A | Discharge status: Home / Self Care | Payer: 59 | Source: Ambulatory Visit | Attending: Obstetrics and Gynecology | Admitting: Obstetrics and Gynecology

## 2021-06-30 ENCOUNTER — Other Ambulatory Visit: Payer: Self-pay

## 2021-06-30 ENCOUNTER — Other Ambulatory Visit: Payer: 59

## 2021-06-30 ENCOUNTER — Other Ambulatory Visit: Payer: Self-pay | Admitting: Obstetrics and Gynecology

## 2021-06-30 DIAGNOSIS — R928 Other abnormal and inconclusive findings on diagnostic imaging of breast: Secondary | ICD-10-CM

## 2021-07-04 ENCOUNTER — Other Ambulatory Visit: Payer: Self-pay | Admitting: Internal Medicine

## 2021-07-30 ENCOUNTER — Other Ambulatory Visit: Payer: Self-pay | Admitting: Primary Care

## 2021-07-30 NOTE — Telephone Encounter (Signed)
Received faxed refill request from pharmacy  Medication name/strength/dose: trazodone 50 mg Medication last rx'd: 03/18/2021  Quantity and number of refills last rx'd: #90 with 1 refill Instructions: take 1/2-1 tablet at bedtime  Last OV: 03/18/2021 Next OV: no pending appts  BW  please advise on refill request  No Known Allergies Current Outpatient Medications on File Prior to Visit  Medication Sig Dispense Refill   Biotin 5 MG CAPS Take 1 capsule by mouth daily.     buPROPion (WELLBUTRIN XL) 300 MG 24 hr tablet TAKE 1 TABLET(300 MG) BY MOUTH EVERY MORNING 90 tablet 0   Cholecalciferol (VITAMIN D3) 2000 units TABS Take 1 tablet by mouth daily.     clobetasol cream (TEMOVATE) 0.05 % APPLY TOPICALLY TO THE AFFECTED AREA TWICE DAILY FOR 14 DAYS     DULoxetine (CYMBALTA) 30 MG capsule TAKE 1 CAPSULE BY MOUTH EVERY DAY ALONG WITH 60MG  CAPSULE 90 capsule 0   DULoxetine (CYMBALTA) 60 MG capsule TAKE 1 CAPSULE BY MOUTH EVERY DAY 90 capsule 0   eszopiclone (LUNESTA) 2 MG TABS tablet TAKE 1 TABLET BY MOUTH EVERY NIGHT AT BEDTIME AS NEEDED FOR SLEEP 90 tablet 1   ketoconazole (NIZORAL) 2 % shampoo APPLY TOPICALLY 2 TO 3 TIMES A WEEK     loratadine (CLARITIN) 10 MG tablet Take 10 mg by mouth daily.     metFORMIN (GLUCOPHAGE) 850 MG tablet Take 1 tablet (850 mg total) by mouth 2 (two) times daily with a meal. 180 tablet 3   MILI 0.25-35 MG-MCG tablet      polyethylene glycol (MIRALAX) 17 g packet Take 17 g by mouth daily. 14 each 0   spironolactone (ALDACTONE) 100 MG tablet Take 100 mg by mouth 2 (two) times daily.     SUMAtriptan (IMITREX) 100 MG tablet May repeat in 2 hours if headache persists or recurs. 15 tablet 0   traZODone (DESYREL) 50 MG tablet Take 1/2-1 tablet by mouth at bedtime for insomnia 90 tablet 1   triamcinolone cream (KENALOG) 0.1 % triamcinolone acetonide 0.1 % topical cream  APPLY EXTERNALLY TO THE AFFECTED AREA TWICE DAILY FOR 15 DAYS     zinc gluconate 50 MG tablet Take 50  mg by mouth daily.     No current facility-administered medications on file prior to visit.

## 2021-08-04 ENCOUNTER — Other Ambulatory Visit: Payer: Self-pay | Admitting: Internal Medicine

## 2021-08-31 ENCOUNTER — Other Ambulatory Visit: Payer: Self-pay | Admitting: Internal Medicine

## 2021-09-07 ENCOUNTER — Other Ambulatory Visit: Payer: Self-pay | Admitting: Internal Medicine

## 2021-09-20 ENCOUNTER — Other Ambulatory Visit: Payer: Self-pay | Admitting: Primary Care

## 2021-09-20 DIAGNOSIS — G4700 Insomnia, unspecified: Secondary | ICD-10-CM

## 2021-09-20 DIAGNOSIS — G4701 Insomnia due to medical condition: Secondary | ICD-10-CM

## 2021-09-22 ENCOUNTER — Other Ambulatory Visit: Payer: Self-pay | Admitting: Primary Care

## 2021-09-22 ENCOUNTER — Telehealth: Payer: Self-pay | Admitting: Pulmonary Disease

## 2021-09-22 DIAGNOSIS — G4700 Insomnia, unspecified: Secondary | ICD-10-CM

## 2021-09-22 DIAGNOSIS — G4701 Insomnia due to medical condition: Secondary | ICD-10-CM

## 2021-09-22 NOTE — Telephone Encounter (Signed)
I have left a message for the patient that she is overdue for a follow up and needs an OV for further refills for her Lunesta.

## 2021-09-23 MED ORDER — ESZOPICLONE 2 MG PO TABS: ORAL_TABLET | ORAL | 0 refills | Status: DC

## 2021-09-23 NOTE — Telephone Encounter (Signed)
I called and gave 30 day supply until she can get in for her appt  Pt aware to keep appt for future refills on this  Nothing further needed

## 2021-09-23 NOTE — Telephone Encounter (Signed)
Patient states needs refill for Lunesta. Patient scheduled 10/22/2021 with Dr. Wynona Neat. Pharmacy is [facility] [place]. Patient phone number is [phone].

## 2021-10-04 ENCOUNTER — Other Ambulatory Visit: Payer: Self-pay | Admitting: Internal Medicine

## 2021-10-21 ENCOUNTER — Other Ambulatory Visit: Payer: Self-pay | Admitting: Internal Medicine

## 2021-10-22 ENCOUNTER — Other Ambulatory Visit: Payer: Self-pay

## 2021-10-22 ENCOUNTER — Encounter: Payer: Self-pay | Admitting: Pulmonary Disease

## 2021-10-22 ENCOUNTER — Ambulatory Visit: Payer: 59 | Admitting: Pulmonary Disease

## 2021-10-22 VITALS — BP 118/76 | HR 90 | Temp 97.8°F | Ht 67.0 in | Wt 213.0 lb

## 2021-10-22 DIAGNOSIS — G4701 Insomnia due to medical condition: Secondary | ICD-10-CM | POA: Diagnosis not present

## 2021-10-22 NOTE — Progress Notes (Signed)
Sandra Hudson    [id number]    06/07/1976  Primary Care Physician:Panosh, Neta [name], MD  Referring Physician: Madelin HeadingsPanosh, [name], MD [address] [place],  [place] [id number]  Chief complaint:   Patient with daytime sleepiness Sleep onset and sleep maintenance insomnia Recent sleep study negative for significant sleep disordered breathing, did support insomnia  HPI:  Currently on Lunesta and trazodone  No difficulty falling asleep Waking up feeling like she is at a good nights rest  Trying to get more active  Has had difficulty sleeping since school age Difficulty falling asleep, difficulty maintaining sleep  She is finally using Lunesta in combination with trazodone-improvement in symptoms She still wakes up a couple of times during the night-able to usually go back to sleep -She wonders if she may be able to transition to just use on one agent which we discussed today -She still feels she wakes up in the morning sometimes feeling like she did not have restorative sleep Better with current treatment  Usually goes to bed about 9-11 PM, may take about an hour to fall asleep-this has not changed recently Wakes up about 2-3 times during the night Finally gets out of bed at about 8 to 8:30 in the morning, spouse wakes up about 530-so she is in and out of sleep between 530 and 830 Has significant sleep inertia She does have some daytime sleepiness but does not take naps No significant night sweats She moves around the bed during sleep but does not have any symptoms suggesting restless legs History of nasal surgery for deviated septum  History of depression Non-smoker  Outpatient Encounter Medications as of 10/22/2021  Medication Sig   Biotin 5 MG CAPS Take 1 capsule by mouth daily.   buPROPion (WELLBUTRIN XL) 300 MG 24 hr tablet TAKE 1 TABLET(300 MG) BY MOUTH EVERY MORNING   Cholecalciferol (VITAMIN D3) 2000 units TABS Take 1 tablet by mouth daily.   clobetasol  cream (TEMOVATE) 0.05 % APPLY TOPICALLY TO THE AFFECTED AREA TWICE DAILY FOR 14 DAYS   DULoxetine (CYMBALTA) 30 MG capsule TAKE 1 CAPSULE BY MOUTH EVERY DAY ALONG WITH 60MG  CAPSULE   DULoxetine (CYMBALTA) 60 MG capsule TAKE 1 CAPSULE BY MOUTH EVERY DAY   eszopiclone (LUNESTA) 2 MG TABS tablet TAKE 1 TABLET BY MOUTH EVERY NIGHT AT BEDTIME AS NEEDED FOR SLEEP   ketoconazole (NIZORAL) 2 % shampoo APPLY TOPICALLY 2 TO 3 TIMES A WEEK   loratadine (CLARITIN) 10 MG tablet Take 10 mg by mouth daily.   metFORMIN (GLUCOPHAGE) 850 MG tablet TAKE 1 TABLET(850 MG) BY MOUTH TWICE DAILY WITH A MEAL   MILI 0.25-35 MG-MCG tablet    polyethylene glycol (MIRALAX) 17 g packet Take 17 g by mouth daily.   spironolactone (ALDACTONE) 100 MG tablet Take 100 mg by mouth 2 (two) times daily.   SUMAtriptan (IMITREX) 100 MG tablet MAY REPEAT IN 2 HOURS IF HEADACHE PERSISTS OR RECURS   traZODone (DESYREL) 50 MG tablet TAKE 1/2 TO 1 TABLET BY MOUTH AT BEDTIME AS DIRECTED   triamcinolone cream (KENALOG) 0.1 % triamcinolone acetonide 0.1 % topical cream  APPLY EXTERNALLY TO THE AFFECTED AREA TWICE DAILY FOR 15 DAYS   zinc gluconate 50 MG tablet Take 50 mg by mouth daily.   No facility-administered encounter medications on file as of 10/22/2021.    Allergies as of 10/22/2021   (No Known Allergies)    Past Medical History:  Diagnosis Date  Allergic rhinitis due to dust    Allergy    Breech presentation 01/29/2014   Female infertility    Headache(784.0)    Pre-eclampsia 01/29/2014   Pregnancy induced hypertension    Status post primary low transverse cesarean section 02/01/2014    Past Surgical History:  Procedure Laterality Date   CESAREAN SECTION N/A 01/31/2014   Procedure: CESAREAN SECTION;  Surgeon: Purcell Nails, MD;  Location: [facility];  Service: Obstetrics;  Laterality: N/A;   COLONOSCOPY  09/16/2011   NASAL SEPTUM SURGERY     deviated septum and large turbinate    Family History  Problem Relation Age of  Onset   Depression Father    ALS Father        multiple system atropy   Depression Mother    Breast cancer Other        maternal great aunt   Breast cancer Paternal Grandmother    Colon cancer Neg Hx    Pancreatic cancer Neg Hx    Esophageal cancer Neg Hx     Social History   Socioeconomic History   Marital status: Married    Spouse name: Not on file   Number of children: 1   Years of education: Not on file   Highest education level: Not on file  Occupational History    Employer: UNEMPLOYED  Tobacco Use   Smoking status: Never   Smokeless tobacco: Never  Vaping Use   Vaping Use: Never used  Substance and Sexual Activity   Alcohol use: No    Comment: occasionally   Drug use: No   Sexual activity: Not on file  Other Topics Concern   Not on file  Social History Narrative   HH of 4   1 Cat   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   Social Determinants of Health   Financial Resource Strain: Not on file  Food Insecurity: Not on file  Transportation Needs: Not on file  Physical Activity: Not on file  Stress: Not on file  Social Connections: Not on file  Intimate Partner Violence: Not on file    Review of Systems  Constitutional:  Positive for fatigue.  Respiratory: Negative.    Cardiovascular: Negative.   Gastrointestinal: Negative.   Psychiatric/Behavioral:  Positive for sleep disturbance.   All other systems reviewed and are negative.  Vitals:   10/22/21 1146  BP: 118/76  Pulse: 90  Temp: 97.8 F (36.6 C)  SpO2: 99%     Physical Exam Constitutional:      Appearance: She is well-developed.  HENT:     Head: Normocephalic and atraumatic.  Eyes:     General:        Right eye: No discharge.     Conjunctiva/sclera: Conjunctivae normal.     Pupils: Pupils are equal, round, and reactive to light.  Neck:     Thyroid: No thyromegaly.     Trachea: No tracheal deviation.  Cardiovascular:     Rate and  Rhythm: Normal rate and regular rhythm.  Pulmonary:     Effort: Pulmonary effort is normal. No respiratory distress.     Breath sounds: Normal breath sounds. No wheezing.  Abdominal:     General: Bowel sounds are normal. There is no distension.     Palpations: Abdomen is soft.     Tenderness: There is no abdominal tenderness. There is no rebound.  Musculoskeletal:  Cervical back: Normal range of motion and neck supple.   Epworth Sleepiness Scale of 6   Assessment:   Sleep onset and sleep maintenance insomnia -Ruled out for sleep apnea on study -Quality of sleep is better with trazodone and Lunesta  Nonrestorative sleep  Insomnia -currently on Lunesta and trazodone-both seem to be helping -The combination helps  Mood Disorder -Generally better   Plan  Continue Lunesta  Continue trazodone  Call with significant concerns  Follow-up in 6 months   Virl Diamond MD [facility] 10/22/2021, 12:06 PM  CC: Panosh, Neta Mends, MD

## 2021-10-22 NOTE — Patient Instructions (Signed)
I will see about 6 months from here  Call us with any significant concerns  Call us if you need medication refills

## 2021-10-27 ENCOUNTER — Ambulatory Visit (INDEPENDENT_AMBULATORY_CARE_PROVIDER_SITE_OTHER): Payer: 59 | Admitting: Internal Medicine

## 2021-10-27 ENCOUNTER — Encounter: Payer: Self-pay | Admitting: Internal Medicine

## 2021-10-27 VITALS — BP 110/68 | HR 103 | Temp 99.1°F | Ht 67.0 in | Wt 213.0 lb

## 2021-10-27 DIAGNOSIS — R739 Hyperglycemia, unspecified: Secondary | ICD-10-CM

## 2021-10-27 DIAGNOSIS — F4321 Adjustment disorder with depressed mood: Secondary | ICD-10-CM

## 2021-10-27 DIAGNOSIS — Z79899 Other long term (current) drug therapy: Secondary | ICD-10-CM | POA: Diagnosis not present

## 2021-10-27 DIAGNOSIS — E785 Hyperlipidemia, unspecified: Secondary | ICD-10-CM | POA: Diagnosis not present

## 2021-10-27 DIAGNOSIS — R69 Illness, unspecified: Secondary | ICD-10-CM | POA: Diagnosis not present

## 2021-10-27 DIAGNOSIS — R41840 Attention and concentration deficit: Secondary | ICD-10-CM | POA: Diagnosis not present

## 2021-10-27 DIAGNOSIS — F5104 Psychophysiologic insomnia: Secondary | ICD-10-CM

## 2021-10-27 MED ORDER — DULOXETINE HCL 30 MG PO CPEP: ORAL_CAPSULE | ORAL | 1 refills | Status: DC

## 2021-10-27 MED ORDER — DULOXETINE HCL 60 MG PO CPEP: ORAL_CAPSULE | ORAL | 1 refills | Status: DC

## 2021-10-27 NOTE — Patient Instructions (Addendum)
Good to see you today .   I would like  psychiatry or  behavior  health  prescriber  to help with meds   I agree that maybe a neuropsychological  evaluation to help  poss attention al issues .triggering mood issues .   Due for blood work  monitoring also .  Then  plan follow up  depending on results

## 2021-10-27 NOTE — Progress Notes (Signed)
Chief Complaint  Patient presents with   Medication Refill    HPI: Sandra Hudson 46 y.o. come in for Chronic disease management  Last visit was video last February. She is taking metformin for her hyperglycemia and past history of gestational diabetes She is now on spironolactone for help with hair loss and thinning per her dermatologist. She is cut her hair sure to keep from picking at it Psych and sleep mood not as good recently has been on Cymbalta 90 mg and recently tried 120 with some help she continues on Wellbutrin 300 mg a day. In the meantime she has had a sleep evaluation and was prescribed 50 mg trazodone +2 mg Lunesta every night which does help with sleep by the sleep evaluators.  There are some in her circle who advised her she could have attentional difficulties such as ADD question reevaluate.  Jenne Pane are now on school but she a cookie Kermit will be coming up soon and she will be involved.  Stressful but a positive  ROS: See pertinent positives and negatives per HPI.  Past Medical History:  Diagnosis Date   Allergic rhinitis due to dust    Allergy    Breech presentation 01/29/2014   Female infertility    Headache(784.0)    Pre-eclampsia 01/29/2014   Pregnancy induced hypertension    Status post primary low transverse cesarean section 02/01/2014    Family History  Problem Relation Age of Onset   Depression Father    ALS Father        multiple system atropy   Depression Mother    Breast cancer Other        maternal great aunt   Breast cancer Paternal Grandmother    Colon cancer Neg Hx    Pancreatic cancer Neg Hx    Esophageal cancer Neg Hx     Social History   Socioeconomic History   Marital status: Married    Spouse name: Not on file   Number of children: 1   Years of education: Not on file   Highest education level: Not on file  Occupational History    Employer: UNEMPLOYED  Tobacco Use   Smoking status: Never   Smokeless tobacco:  Never  Vaping Use   Vaping Use: Never used  Substance and Sexual Activity   Alcohol use: No    Comment: occasionally   Drug use: No   Sexual activity: Not on file  Other Topics Concern   Not on file  Social History Narrative   HH of 4   1 Cat   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   Social Determinants of Health   Financial Resource Strain: Not on file  Food Insecurity: Not on file  Transportation Needs: Not on file  Physical Activity: Not on file  Stress: Not on file  Social Connections: Not on file    Outpatient Medications Prior to Visit  Medication Sig Dispense Refill   Biotin 5 MG CAPS Take 1 capsule by mouth daily.     buPROPion (WELLBUTRIN XL) 300 MG 24 hr tablet TAKE 1 TABLET(300 MG) BY MOUTH EVERY MORNING 90 tablet 0   Cholecalciferol (VITAMIN D3) 2000 units TABS Take 1 tablet by mouth daily.     clobetasol cream (TEMOVATE) 0.05 % APPLY TOPICALLY TO THE AFFECTED AREA TWICE DAILY FOR 14 DAYS     ketoconazole (NIZORAL) 2 %  shampoo APPLY TOPICALLY 2 TO 3 TIMES A WEEK     loratadine (CLARITIN) 10 MG tablet Take 10 mg by mouth daily.     metFORMIN (GLUCOPHAGE) 850 MG tablet TAKE 1 TABLET(850 MG) BY MOUTH TWICE DAILY WITH A MEAL 30 tablet 0   MILI 0.25-35 MG-MCG tablet      polyethylene glycol (MIRALAX) 17 g packet Take 17 g by mouth daily. 14 each 0   spironolactone (ALDACTONE) 100 MG tablet Take 100 mg by mouth 2 (two) times daily.     SUMAtriptan (IMITREX) 100 MG tablet MAY REPEAT IN 2 HOURS IF HEADACHE PERSISTS OR RECURS 15 tablet 0   traZODone (DESYREL) 50 MG tablet TAKE 1/2 TO 1 TABLET BY MOUTH AT BEDTIME AS DIRECTED 90 tablet 1   triamcinolone cream (KENALOG) 0.1 % triamcinolone acetonide 0.1 % topical cream  APPLY EXTERNALLY TO THE AFFECTED AREA TWICE DAILY FOR 15 DAYS     zinc gluconate 50 MG tablet Take 50 mg by mouth daily.     DULoxetine (CYMBALTA) 30 MG capsule TAKE 1 CAPSULE BY MOUTH EVERY DAY ALONG WITH 60MG   CAPSULE 90 capsule 0   DULoxetine (CYMBALTA) 60 MG capsule TAKE 1 CAPSULE BY MOUTH EVERY DAY 90 capsule 0   eszopiclone (LUNESTA) 2 MG TABS tablet TAKE 1 TABLET BY MOUTH EVERY NIGHT AT BEDTIME AS NEEDED FOR SLEEP 30 tablet 0   No facility-administered medications prior to visit.     EXAM:  BP 110/68 (BP Location: Left Arm, Patient Position: Sitting, Cuff Size: Normal)    Pulse (!) 103    Temp 99.1 F (37.3 C) (Oral)    Ht 5\' 7"  (1.702 m)    Wt 213 lb (96.6 kg)    LMP 10/21/2021    SpO2 100%    BMI 33.36 kg/m   Body mass index is 33.36 kg/m.  GENERAL: vitals reviewed and listed above, alert, oriented, appears well hydrated and in no acute distress HEENT: atraumatic, conjunctiva  clear, no obvious abnormalities on inspection of external nose and ears OP :  masked  MS: moves all extremities without noticeable focal  abnormality PSYCH: pleasant and cooperative, no obvious depression or anxiety Skin  clar short hair some hair loss top thinning. Skin no acne  Lab Results  Component Value Date   WBC 8.0 07/23/2020   HGB 13.2 07/23/2020   HCT 40.0 07/23/2020   PLT 331 07/23/2020   GLUCOSE 104 (H) 12/03/2020   CHOL 232 (H) 12/03/2020   TRIG 230.0 (H) 12/03/2020   HDL 66.10 12/03/2020   LDLDIRECT 144.0 12/03/2020   LDLCALC 162 (H) 07/23/2020   ALT 14 07/23/2020   AST 14 07/23/2020   NA 137 07/23/2020   K 4.8 07/23/2020   CL 103 07/23/2020   CREATININE 1.03 07/23/2020   BUN 14 07/23/2020   CO2 26 07/23/2020   TSH 2.43 07/23/2020   HGBA1C 6.1 12/03/2020   MICROALBUR 1.1 09/15/2010   BP Readings from Last 3 Encounters:  10/27/21 110/68  10/22/21 118/76  03/18/21 120/76    ASSESSMENT AND PLAN:  Discussed the following assessment and plan:  Medication management - Plan: Basic metabolic panel, CBC with Differential/Platelet, Hemoglobin A1c, Hepatic function panel, Lipid panel, TSH, T4, free, Ambulatory referral to Psychiatry  DISORDER, ADJUSTMENT W/DEPRESSED MOOD - Plan:  Basic metabolic panel, CBC with Differential/Platelet, Hemoglobin A1c, Hepatic function panel, Lipid panel, TSH, T4, free, Ambulatory referral to Psychiatry  Hyperglycemia - Plan: Basic metabolic panel, CBC with Differential/Platelet, Hemoglobin A1c, Hepatic function panel,  Lipid panel, TSH, T4, free  Hyperlipidemia, unspecified hyperlipidemia type - Plan: Basic metabolic panel, CBC with Differential/Platelet, Hemoglobin A1c, Hepatic function panel, Lipid panel, TSH, T4, free  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  At this point I would feel uncomfortable increasing her Cymbalta 120 mg   Also agree with perhaps psycho neuro educational testing or evaluation to look for possible underlying attentional deficit versus auditory processing.  This could be adding to these other organizational issues. Plan CPX after labs this spring. -Patient advised to return or notify health care team  if  new concerns arise.  Patient Instructions  Good to see you today .   I would like  psychiatry or  behavior  health  prescriber  to help with meds   I agree that maybe a neuropsychological  evaluation to help  poss attention al issues .triggering mood issues .   Due for blood work  monitoring also .  Then  plan follow up  depending on results Marianna. Demri Poulton M.D.

## 2021-10-28 ENCOUNTER — Telehealth: Payer: Self-pay | Admitting: Pulmonary Disease

## 2021-10-28 NOTE — Telephone Encounter (Signed)
Dr. Val Eagle, please advise if you are okay refilling pt's Lunesta. Pharmacy this needs to be sent to is [facility] off of [address] in [place].

## 2021-10-29 ENCOUNTER — Other Ambulatory Visit: Payer: Self-pay | Admitting: Pulmonary Disease

## 2021-10-29 ENCOUNTER — Encounter: Payer: Self-pay | Admitting: Pulmonary Disease

## 2021-10-29 DIAGNOSIS — G4701 Insomnia due to medical condition: Secondary | ICD-10-CM

## 2021-10-29 DIAGNOSIS — G4700 Insomnia, unspecified: Secondary | ICD-10-CM

## 2021-10-29 MED ORDER — ESZOPICLONE 2 MG PO TABS: ORAL_TABLET | ORAL | 3 refills | Status: DC

## 2021-10-29 NOTE — Telephone Encounter (Signed)
Lunesta refilled, patient informed via MyChart

## 2021-10-29 NOTE — Progress Notes (Signed)
Lunesta reordered.

## 2021-11-02 ENCOUNTER — Other Ambulatory Visit (INDEPENDENT_AMBULATORY_CARE_PROVIDER_SITE_OTHER): Payer: 59

## 2021-11-02 DIAGNOSIS — F4321 Adjustment disorder with depressed mood: Secondary | ICD-10-CM | POA: Diagnosis not present

## 2021-11-02 DIAGNOSIS — R739 Hyperglycemia, unspecified: Secondary | ICD-10-CM

## 2021-11-02 DIAGNOSIS — Z79899 Other long term (current) drug therapy: Secondary | ICD-10-CM | POA: Diagnosis not present

## 2021-11-02 DIAGNOSIS — E785 Hyperlipidemia, unspecified: Secondary | ICD-10-CM | POA: Diagnosis not present

## 2021-11-02 DIAGNOSIS — R69 Illness, unspecified: Secondary | ICD-10-CM | POA: Diagnosis not present

## 2021-11-02 LAB — LDL CHOLESTEROL, DIRECT: Direct LDL: 145 mg/dL

## 2021-11-02 LAB — T4, FREE: Free T4: 1.34 ng/dL (ref 0.60–1.60)

## 2021-11-02 LAB — CBC WITH DIFFERENTIAL/PLATELET
Basophils Absolute: 0.1 10*3/uL (ref 0.0–0.1)
Basophils Relative: 0.8 % (ref 0.0–3.0)
Eosinophils Absolute: 0.2 10*3/uL (ref 0.0–0.7)
Eosinophils Relative: 2.6 % (ref 0.0–5.0)
HCT: 41.3 % (ref 36.0–46.0)
Hemoglobin: 13.6 g/dL (ref 12.0–15.0)
Lymphocytes Relative: 31.5 % (ref 12.0–46.0)
Lymphs Abs: 2.1 10*3/uL (ref 0.7–4.0)
MCHC: 32.8 g/dL (ref 30.0–36.0)
MCV: 86.1 fl (ref 78.0–100.0)
Monocytes Absolute: 0.4 10*3/uL (ref 0.1–1.0)
Monocytes Relative: 5.7 % (ref 3.0–12.0)
Neutro Abs: 3.9 10*3/uL (ref 1.4–7.7)
Neutrophils Relative %: 59.4 % (ref 43.0–77.0)
Platelets: 332 10*3/uL (ref 150.0–400.0)
RBC: 4.8 Mil/uL (ref 3.87–5.11)
RDW: 12.9 % (ref 11.5–15.5)
WBC: 6.6 10*3/uL (ref 4.0–10.5)

## 2021-11-02 LAB — LIPID PANEL
Cholesterol: 237 mg/dL — ABNORMAL HIGH (ref 0–200)
HDL: 60.4 mg/dL (ref >39.00)
NonHDL: 176.1
Total CHOL/HDL Ratio: 4
Triglycerides: 272 mg/dL — ABNORMAL HIGH (ref 0.0–149.0)
VLDL: 54.4 mg/dL — ABNORMAL HIGH (ref 0.0–40.0)

## 2021-11-02 LAB — HEPATIC FUNCTION PANEL
ALT: 17 U/L (ref 0–35)
AST: 18 U/L (ref 0–37)
Albumin: 4 g/dL (ref 3.5–5.2)
Alkaline Phosphatase: 51 U/L (ref 39–117)
Bilirubin, Direct: 0 mg/dL (ref 0.0–0.3)
Total Bilirubin: 0.4 mg/dL (ref 0.2–1.2)
Total Protein: 7.7 g/dL (ref 6.0–8.3)

## 2021-11-02 LAB — BASIC METABOLIC PANEL
BUN: 19 mg/dL (ref 6–23)
CO2: 25 mEq/L (ref 19–32)
Calcium: 9.5 mg/dL (ref 8.4–10.5)
Chloride: 99 mEq/L (ref 96–112)
Creatinine, Ser: 0.92 mg/dL (ref 0.40–1.20)
GFR: 75.94 mL/min (ref >60.00)
Glucose, Bld: 110 mg/dL — ABNORMAL HIGH (ref 70–99)
Potassium: 4.2 mEq/L (ref 3.5–5.1)
Sodium: 135 mEq/L (ref 135–145)

## 2021-11-02 LAB — TSH: TSH: 2.34 u[IU]/mL (ref 0.35–5.50)

## 2021-11-02 LAB — HEMOGLOBIN A1C: Hgb A1c MFr Bld: 6.5 % (ref 4.6–6.5)

## 2021-11-03 ENCOUNTER — Other Ambulatory Visit: Payer: Self-pay | Admitting: Internal Medicine

## 2021-11-04 DIAGNOSIS — F411 Generalized anxiety disorder: Secondary | ICD-10-CM | POA: Diagnosis not present

## 2021-11-04 DIAGNOSIS — R69 Illness, unspecified: Secondary | ICD-10-CM | POA: Diagnosis not present

## 2021-11-07 NOTE — Progress Notes (Signed)
Triglycerides still up some .  Hg a1c in the prediabetes   almost diabetes level .   We can address at  next visit. For intervention to prevent full blown diabetes .

## 2021-11-08 DIAGNOSIS — Z79891 Long term (current) use of opiate analgesic: Secondary | ICD-10-CM | POA: Diagnosis not present

## 2021-11-22 DIAGNOSIS — F411 Generalized anxiety disorder: Secondary | ICD-10-CM | POA: Diagnosis not present

## 2021-11-22 DIAGNOSIS — R69 Illness, unspecified: Secondary | ICD-10-CM | POA: Diagnosis not present

## 2021-12-02 DIAGNOSIS — F411 Generalized anxiety disorder: Secondary | ICD-10-CM | POA: Diagnosis not present

## 2021-12-02 DIAGNOSIS — R69 Illness, unspecified: Secondary | ICD-10-CM | POA: Diagnosis not present

## 2021-12-06 ENCOUNTER — Other Ambulatory Visit: Payer: Self-pay

## 2021-12-06 ENCOUNTER — Encounter: Payer: Self-pay | Admitting: Internal Medicine

## 2021-12-06 ENCOUNTER — Telehealth: Payer: Self-pay

## 2021-12-06 ENCOUNTER — Ambulatory Visit (INDEPENDENT_AMBULATORY_CARE_PROVIDER_SITE_OTHER): Payer: 59 | Admitting: Internal Medicine

## 2021-12-06 ENCOUNTER — Other Ambulatory Visit (HOSPITAL_COMMUNITY): Payer: Self-pay

## 2021-12-06 VITALS — BP 124/80 | HR 87 | Temp 99.0°F | Ht 67.0 in | Wt 206.2 lb

## 2021-12-06 DIAGNOSIS — Z0000 Encounter for general adult medical examination without abnormal findings: Secondary | ICD-10-CM

## 2021-12-06 DIAGNOSIS — R739 Hyperglycemia, unspecified: Secondary | ICD-10-CM

## 2021-12-06 DIAGNOSIS — Z79899 Other long term (current) drug therapy: Secondary | ICD-10-CM | POA: Diagnosis not present

## 2021-12-06 DIAGNOSIS — F5104 Psychophysiologic insomnia: Secondary | ICD-10-CM

## 2021-12-06 DIAGNOSIS — E785 Hyperlipidemia, unspecified: Secondary | ICD-10-CM | POA: Diagnosis not present

## 2021-12-06 DIAGNOSIS — Z8632 Personal history of gestational diabetes: Secondary | ICD-10-CM

## 2021-12-06 DIAGNOSIS — R69 Illness, unspecified: Secondary | ICD-10-CM | POA: Diagnosis not present

## 2021-12-06 NOTE — Patient Instructions (Addendum)
Good to see you  today.  Work on  healthy weight loss   should help lipids and blood sugar control      Same meds  6 mos lab and then visit  or as needed

## 2021-12-06 NOTE — Progress Notes (Signed)
Chief Complaint  Patient presents with   Annual Exam    HPI: Patient  Sandra Hudson  46 y.o. comes in today for Preventive Health Care visit and follow-up of labs. Gestational diabetes prediabetes taking metformin 850 twice a day without difficulty.  Diet could have been better although getting better after the holidays. Mood improved since increase in medication   Will be seeing specialist.  Problems with organizational home keeping tasks. Sleep insurance will only pay for 15 Lunesta tablets per month still looking into it.  Blood pressure has been okay Lipids are back up again has had them controlled better in the past when on weight watchers and had healthy weight loss.  Health Maintenance  Topic Date Due   FOOT EXAM  Never done   URINE MICROALBUMIN  09/16/2011   INFLUENZA VACCINE  04/26/2022 (Originally 05/17/2021)   PAP SMEAR-Modifier  04/26/2022 (Originally 08/27/2021)   COVID-19 Vaccine (4 - Booster for Moderna series) 04/26/2022 (Originally 01/13/2021)   HEMOGLOBIN A1C  05/02/2022   OPHTHALMOLOGY EXAM  07/31/2022   TETANUS/TDAP  02/02/2024   Hepatitis C Screening  Completed   HIV Screening  Completed   HPV VACCINES  Aged Out   Health Maintenance Review LIFESTYLE:  Exercise:   cokkie season  active  Tobacco/ETS: n Alcohol: n Sugar beverages:  mostly water  coffee .  Sleep: off meds cause of insurance co.  About 5 hours  insurance .  Drug use: no HH of  4   ROS:  REST of 12 system review negative except as per HPI   Past Medical History:  Diagnosis Date   Allergic rhinitis due to dust    Allergy    Breech presentation 01/29/2014   Female infertility    Headache(784.0)    Pre-eclampsia 01/29/2014   Pregnancy induced hypertension    Status post primary low transverse cesarean section 02/01/2014    Past Surgical History:  Procedure Laterality Date   CESAREAN SECTION N/A 01/31/2014   Procedure: CESAREAN SECTION;  Surgeon: Purcell Nails, MD;  Location: [facility];   Service: Obstetrics;  Laterality: N/A;   COLONOSCOPY  09/16/2011   NASAL SEPTUM SURGERY     deviated septum and large turbinate    Family History  Problem Relation Age of Onset   Depression Father    ALS Father        multiple system atropy   Depression Mother    Breast cancer Other        maternal great aunt   Breast cancer Paternal Grandmother    Colon cancer Neg Hx    Pancreatic cancer Neg Hx    Esophageal cancer Neg Hx     Social History   Socioeconomic History   Marital status: Married    Spouse name: Not on file   Number of children: 1   Years of education: Not on file   Highest education level: Not on file  Occupational History    Employer: UNEMPLOYED  Tobacco Use   Smoking status: Never   Smokeless tobacco: Never  Vaping Use   Vaping Use: Never used  Substance and Sexual Activity   Alcohol use: No    Comment: occasionally   Drug use: No   Sexual activity: Not on file  Other Topics Concern   Not on file  Social History Narrative   HH of 4   1 Cat   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   Social Determinants of Health   Financial Resource Strain: Not on file  Food Insecurity: Not on file  Transportation Needs: Not on file  Physical Activity: Not on file  Stress: Not on file  Social Connections: Not on file    Outpatient Medications Prior to Visit  Medication Sig Dispense Refill   Biotin 5 MG CAPS Take 1 capsule by mouth daily.     buPROPion (WELLBUTRIN XL) 300 MG 24 hr tablet TAKE 1 TABLET(300 MG) BY MOUTH EVERY MORNING 90 tablet 0   Cholecalciferol (VITAMIN D3) 2000 units TABS Take 1 tablet by mouth daily.     clobetasol cream (TEMOVATE) 0.05 % APPLY TOPICALLY TO THE AFFECTED AREA TWICE DAILY FOR 14 DAYS     DULoxetine (CYMBALTA) 30 MG capsule TAKE 1 CAPSULE BY MOUTH EVERY DAY ALONG WITH 60MG  CAPSULE 90 capsule 1   DULoxetine (CYMBALTA) 60 MG capsule TAKE 1 CAPSULE BY MOUTH EVERY DAY 90 capsule 1    eszopiclone (LUNESTA) 2 MG TABS tablet TAKE 1 TABLET BY MOUTH EVERY NIGHT AT BEDTIME AS NEEDED FOR SLEEP 30 tablet 3   ketoconazole (NIZORAL) 2 % shampoo APPLY TOPICALLY 2 TO 3 TIMES A WEEK     loratadine (CLARITIN) 10 MG tablet Take 10 mg by mouth daily.     metFORMIN (GLUCOPHAGE) 850 MG tablet TAKE 1 TABLET(850 MG) BY MOUTH TWICE DAILY WITH A MEAL 30 tablet 0   MILI 0.25-35 MG-MCG tablet      polyethylene glycol (MIRALAX) 17 g packet Take 17 g by mouth daily. 14 each 0   spironolactone (ALDACTONE) 100 MG tablet Take 100 mg by mouth 2 (two) times daily.     SUMAtriptan (IMITREX) 100 MG tablet MAY REPEAT IN 2 HOURS IF HEADACHE PERSISTS OR RECURS 15 tablet 0   traZODone (DESYREL) 50 MG tablet TAKE 1/2 TO 1 TABLET BY MOUTH AT BEDTIME AS DIRECTED 90 tablet 1   triamcinolone cream (KENALOG) 0.1 % triamcinolone acetonide 0.1 % topical cream  APPLY EXTERNALLY TO THE AFFECTED AREA TWICE DAILY FOR 15 DAYS     zinc gluconate 50 MG tablet Take 50 mg by mouth daily.     No facility-administered medications prior to visit.     EXAM:  BP 124/80 (BP Location: Left Arm, Patient Position: Sitting, Cuff Size: Normal)    Pulse 87    Temp 99 F (37.2 C) (Oral)    Ht 5\' 7"  (1.702 m)    Wt 206 lb 3.2 oz (93.5 kg)    LMP 12/02/2021    SpO2 99%    BMI 32.30 kg/m   Body mass index is 32.3 kg/m. Wt Readings from Last 3 Encounters:  12/06/21 206 lb 3.2 oz (93.5 kg)  10/27/21 213 lb (96.6 kg)  10/22/21 213 lb (96.6 kg)    Physical Exam: Vital signs reviewed 12/25/21 is a well-developed well-nourished alert cooperative    who appearsr stated age in no acute distress.  HEENT: normocephalic atraumatic , Eyes: PERRL EOM's full, conjunctiva clear, Nares: paten,t no deformity discharge or tenderness., Ears: no deformity EAC's clear TMs with normal landmarks. Mouth: masked  NECK: supple without masses, thyromegaly or bruits. CHEST/PULM:  Clear to auscultation and percussion breath sounds equal no wheeze , rales  or rhonchi. No chest wall deformities or tenderness. Breast: normal by inspection . No dimpling, discharge, masses, tenderness or discharge . CV: PMI is nondisplaced, S1 S2 no gallops, murmurs, rubs. Peripheral pulses are full without delay.No  JVD .  ABDOMEN: Bowel sounds normal nontender  No guard or rebound, no hepato splenomegal no CVA tenderness.   Extremtities:  No clubbing cyanosis or edema, no acute joint swelling or redness no focal atrophy some toe coolness but normal cap refill. NEURO:  Oriented x3, cranial nerves 3-12 appear to be intact, no obvious focal weakness,gait within normal limits no abnormal reflexes or asymmetrical SKIN: No acute rashes normal turgor, color, no bruising or petechiae. PSYCH: Oriented, good eye contact, no obvious depression anxiety, cognition and judgment appear normal.  Proved mood today LN: no cervical axillary adenopathy  Lab Results  Component Value Date   WBC 6.6 11/02/2021   HGB 13.6 11/02/2021   HCT 41.3 11/02/2021   PLT 332.0 11/02/2021   GLUCOSE 110 (H) 11/02/2021   CHOL 237 (H) 11/02/2021   TRIG 272.0 (H) 11/02/2021   HDL 60.40 11/02/2021   LDLDIRECT 145.0 11/02/2021   LDLCALC 162 (H) 07/23/2020   ALT 17 11/02/2021   AST 18 11/02/2021   NA 135 11/02/2021   K 4.2 11/02/2021   CL 99 11/02/2021   CREATININE 0.92 11/02/2021   BUN 19 11/02/2021   CO2 25 11/02/2021   TSH 2.34 11/02/2021   HGBA1C 6.5 11/02/2021   MICROALBUR 1.1 09/15/2010    BP Readings from Last 3 Encounters:  12/06/21 124/80  10/27/21 110/68  10/22/21 118/76  The 10-year ASCVD risk score (Arnett DK, et al., 2019) is: 2.1%   Values used to calculate the score:     Age: 61 years     Sex: Female     Is Non-Hispanic African American: No     Diabetic: Yes     Tobacco smoker: No     Systolic Blood Pressure: 124 mmHg     Is BP treated: Yes     HDL Cholesterol: 60.4 mg/dL     Total Cholesterol: 237 mg/dL   Lab results reviewed with patient   ASSESSMENT AND  PLAN:  Discussed the following assessment and plan:    ICD-10-CM   1. Encounter for preventive health examination  Z00.00     2. Medication management  Z79.899 Lipid panel    Hemoglobin A1c    3. Hyperlipidemia, unspecified hyperlipidemia type  E78.5 Lipid panel    Hemoglobin A1c    4. Hyperglycemia  R73.9 Lipid panel    Hemoglobin A1c    5. History of gestational diabetes  Z86.32 Lipid panel    Hemoglobin A1c    6. Psychophysiological insomnia  F51.04    under specialty care  lunesta     At this time after discussion She will work probably on [employer] tracking consider getting help on organization Healthy weight loss will help her metabolic numbers. Discussed coronary artery calcium scores in case would be helpful in future. Plan 4 to 6 months follow-up with fasting lipid panel and hemoglobin A1c Return in about 6 months (around 06/05/2022) for 4-6 mos with flp and a1c pre visit.  Patient Care Team: [name], MD as PCP - General Christia Reading, MD Nigel Bridgeman, CNM as Midwife Pyrtle, Carie Caddy, MD (Gastroenterology) [place], Elvin So, MD as Referring Physician (Dermatology) Patient Instructions  Good to see you  today.  Work on  healthy weight loss   should help lipids and blood sugar control      Same meds  6 mos lab and then visit  or as needed   Burna Mortimer K. Meklit Cotta M.D.

## 2021-12-06 NOTE — Telephone Encounter (Signed)
Patient Advocate Encounter   Received notification from patient's pharmacy that prior authorization for Lunesta 2mg  tabs is required by his/her insurance [employer].   PA submitted on 12/06/21  Key#:  [id number]  Status is pending    [facility] will continue to follow:  Patient Advocate Fax: [phone]

## 2021-12-07 NOTE — Telephone Encounter (Addendum)
Patient Advocate Encounter  Received notification from [facility] that the request for prior authorization for Lunesta 2mg  tabs has been denied due to the patient not trying Ambien, Ambien ER or Zaleplon.     This determination is currently being appealed.  The appeal was faxed to [facility] @[phone].   This encounter will continue to be updated until final determination.    Specialty Pharmacy Patient Advocate Fax: [phone]

## 2021-12-09 NOTE — Telephone Encounter (Signed)
Please advise 

## 2021-12-13 NOTE — Telephone Encounter (Signed)
Let patient know that Alfonso Patten will not be covered by insurance  I can provide a prescription for Ambien if needed

## 2021-12-18 ENCOUNTER — Other Ambulatory Visit: Payer: Self-pay | Admitting: Internal Medicine

## 2021-12-21 ENCOUNTER — Other Ambulatory Visit (HOSPITAL_COMMUNITY): Payer: Self-pay

## 2021-12-21 NOTE — Telephone Encounter (Signed)
I called the patient and asked her to call back about the response from the insurance.  ?

## 2021-12-21 NOTE — Telephone Encounter (Signed)
I called the patient and she wants to wait for the appeal. She does not want to switch at this time.  ? ? ?

## 2021-12-21 NOTE — Telephone Encounter (Signed)
Have we received a determination for this patient?  ? ?

## 2021-12-22 ENCOUNTER — Other Ambulatory Visit (HOSPITAL_COMMUNITY): Payer: Self-pay

## 2021-12-22 ENCOUNTER — Other Ambulatory Visit: Payer: Self-pay | Admitting: Internal Medicine

## 2021-12-22 MED ORDER — DULOXETINE HCL 30 MG PO CPEP: ORAL_CAPSULE | ORAL | 1 refills | Status: DC

## 2021-12-22 NOTE — Telephone Encounter (Signed)
Last Ov 12/06/21 ?Filled 10/27/21 ?Is it ok to refill? ?

## 2021-12-22 NOTE — Telephone Encounter (Signed)
Ok to refill both for 90 days refill x 1 ( total dose is 90 mf per day)

## 2021-12-23 DIAGNOSIS — F411 Generalized anxiety disorder: Secondary | ICD-10-CM | POA: Diagnosis not present

## 2021-12-23 DIAGNOSIS — R69 Illness, unspecified: Secondary | ICD-10-CM | POA: Diagnosis not present

## 2021-12-23 NOTE — Telephone Encounter (Signed)
Noted. Waiting on the patient to call the office back.  ?

## 2021-12-27 ENCOUNTER — Other Ambulatory Visit: Payer: Self-pay | Admitting: Internal Medicine

## 2022-01-31 ENCOUNTER — Other Ambulatory Visit: Payer: Self-pay | Admitting: Internal Medicine

## 2022-01-31 DIAGNOSIS — F411 Generalized anxiety disorder: Secondary | ICD-10-CM | POA: Diagnosis not present

## 2022-01-31 DIAGNOSIS — R69 Illness, unspecified: Secondary | ICD-10-CM | POA: Diagnosis not present

## 2022-02-07 ENCOUNTER — Other Ambulatory Visit: Payer: Self-pay

## 2022-02-07 ENCOUNTER — Telehealth: Payer: Self-pay | Admitting: Internal Medicine

## 2022-02-07 MED ORDER — METFORMIN HCL 850 MG PO TABS: ORAL_TABLET | ORAL | 1 refills | Status: DC

## 2022-02-07 NOTE — Telephone Encounter (Signed)
Pt called needing Rx refill for Metformin 850 MG sent to  ?[id number] [place] [place] - [address] Phone:  [phone]  ?Fax:  [phone]  ?  ?  ?Please advise. ?

## 2022-02-07 NOTE — Telephone Encounter (Signed)
Rx sent to pharmacy   

## 2022-02-09 DIAGNOSIS — R69 Illness, unspecified: Secondary | ICD-10-CM | POA: Diagnosis not present

## 2022-02-09 DIAGNOSIS — F411 Generalized anxiety disorder: Secondary | ICD-10-CM | POA: Diagnosis not present

## 2022-03-01 DIAGNOSIS — F411 Generalized anxiety disorder: Secondary | ICD-10-CM | POA: Diagnosis not present

## 2022-03-01 DIAGNOSIS — R69 Illness, unspecified: Secondary | ICD-10-CM | POA: Diagnosis not present

## 2022-03-17 DIAGNOSIS — Z1231 Encounter for screening mammogram for malignant neoplasm of breast: Secondary | ICD-10-CM | POA: Diagnosis not present

## 2022-03-17 DIAGNOSIS — Z309 Encounter for contraceptive management, unspecified: Secondary | ICD-10-CM | POA: Diagnosis not present

## 2022-03-17 DIAGNOSIS — Z01419 Encounter for gynecological examination (general) (routine) without abnormal findings: Secondary | ICD-10-CM | POA: Diagnosis not present

## 2022-04-05 ENCOUNTER — Other Ambulatory Visit: Payer: Self-pay | Admitting: Internal Medicine

## 2022-04-07 DIAGNOSIS — F411 Generalized anxiety disorder: Secondary | ICD-10-CM | POA: Diagnosis not present

## 2022-04-07 DIAGNOSIS — R69 Illness, unspecified: Secondary | ICD-10-CM | POA: Diagnosis not present

## 2022-04-18 ENCOUNTER — Other Ambulatory Visit (INDEPENDENT_AMBULATORY_CARE_PROVIDER_SITE_OTHER): Payer: 59

## 2022-04-18 DIAGNOSIS — Z79899 Other long term (current) drug therapy: Secondary | ICD-10-CM

## 2022-04-18 DIAGNOSIS — Z8632 Personal history of gestational diabetes: Secondary | ICD-10-CM

## 2022-04-18 DIAGNOSIS — E785 Hyperlipidemia, unspecified: Secondary | ICD-10-CM | POA: Diagnosis not present

## 2022-04-18 DIAGNOSIS — R739 Hyperglycemia, unspecified: Secondary | ICD-10-CM

## 2022-04-18 LAB — LDL CHOLESTEROL, DIRECT: Direct LDL: 170 mg/dL

## 2022-04-18 LAB — LIPID PANEL
Cholesterol: 282 mg/dL — ABNORMAL HIGH (ref 0–200)
HDL: 77.1 mg/dL (ref >39.00)
NonHDL: 204.64
Total CHOL/HDL Ratio: 4
Triglycerides: 365 mg/dL — ABNORMAL HIGH (ref 0.0–149.0)
VLDL: 73 mg/dL — ABNORMAL HIGH (ref 0.0–40.0)

## 2022-04-18 LAB — HEMOGLOBIN A1C: Hgb A1c MFr Bld: 6.6 % — ABNORMAL HIGH (ref 4.6–6.5)

## 2022-04-18 NOTE — Progress Notes (Signed)
Ldl is back up , a1c in early dm range  Will review at upcoming visit

## 2022-04-24 NOTE — Progress Notes (Unsigned)
No chief complaint on file.   HPI: Sandra Hudson 46 y.o. come in for Chronic disease management   Cpx in Feb  fasting lipid panel and a1c due  ROS: See pertinent positives and negatives per HPI.  Past Medical History:  Diagnosis Date   Allergic rhinitis due to dust    Allergy    Breech presentation 01/29/2014   Female infertility    Headache(784.0)    Pre-eclampsia 01/29/2014   Pregnancy induced hypertension    Status post primary low transverse cesarean section 02/01/2014    Family History  Problem Relation Age of Onset   Depression Father    ALS Father        multiple system atropy   Depression Mother    Breast cancer Other        maternal great aunt   Breast cancer Paternal Grandmother    Colon cancer Neg Hx    Pancreatic cancer Neg Hx    Esophageal cancer Neg Hx     Social History   Socioeconomic History   Marital status: Married    Spouse name: Not on file   Number of children: 1   Years of education: Not on file   Highest education level: Not on file  Occupational History    Employer: UNEMPLOYED  Tobacco Use   Smoking status: Never   Smokeless tobacco: Never  Vaping Use   Vaping Use: Never used  Substance and Sexual Activity   Alcohol use: No    Comment: occasionally   Drug use: No   Sexual activity: Not on file  Other Topics Concern   Not on file  Social History Narrative   HH of 4   1 Cat   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   Social Determinants of Health   Financial Resource Strain: Not on file  Food Insecurity: Not on file  Transportation Needs: Not on file  Physical Activity: Not on file  Stress: Not on file  Social Connections: Not on file    Outpatient Medications Prior to Visit  Medication Sig Dispense Refill   Biotin 5 MG CAPS Take 1 capsule by mouth daily.     buPROPion (WELLBUTRIN XL) 300 MG 24 hr tablet TAKE 1 TABLET(300 MG) BY MOUTH EVERY MORNING 90 tablet 0    Cholecalciferol (VITAMIN D3) 2000 units TABS Take 1 tablet by mouth daily.     clobetasol cream (TEMOVATE) 0.05 % APPLY TOPICALLY TO THE AFFECTED AREA TWICE DAILY FOR 14 DAYS     DULoxetine (CYMBALTA) 30 MG capsule TAKE 1 CAPSULE BY MOUTH EVERY DAY ALONG WITH 60MG  CAPSULE 90 capsule 1   DULoxetine (CYMBALTA) 60 MG capsule TAKE 1 CAPSULE BY MOUTH EVERY DAY 90 capsule 2   eszopiclone (LUNESTA) 2 MG TABS tablet TAKE 1 TABLET BY MOUTH EVERY NIGHT AT BEDTIME AS NEEDED FOR SLEEP 30 tablet 3   ketoconazole (NIZORAL) 2 % shampoo APPLY TOPICALLY 2 TO 3 TIMES A WEEK     loratadine (CLARITIN) 10 MG tablet Take 10 mg by mouth daily.     metFORMIN (GLUCOPHAGE) 850 MG tablet TAKE 1 TABLET(850 MG) BY MOUTH TWICE DAILY WITH A MEAL 90 tablet 1   MILI 0.25-35 MG-MCG tablet      polyethylene glycol (MIRALAX) 17 g packet Take 17 g by mouth daily. 14 each 0   spironolactone (ALDACTONE) 100 MG tablet Take 100 mg by  mouth 2 (two) times daily.     SUMAtriptan (IMITREX) 100 MG tablet TAKE 1 TABLET BY MOUTH AT ONSET OF MIGRAINE AND MAY REPEAT IN 2 HOURS ID HEADACHE PERSISTS OR RECURS 12 tablet 1   traZODone (DESYREL) 50 MG tablet TAKE 1/2 TO 1 TABLET BY MOUTH AT BEDTIME AS DIRECTED 90 tablet 1   triamcinolone cream (KENALOG) 0.1 % triamcinolone acetonide 0.1 % topical cream  APPLY EXTERNALLY TO THE AFFECTED AREA TWICE DAILY FOR 15 DAYS     zinc gluconate 50 MG tablet Take 50 mg by mouth daily.     No facility-administered medications prior to visit.     EXAM:  There were no vitals taken for this visit.  There is no height or weight on file to calculate BMI.  GENERAL: vitals reviewed and listed above, alert, oriented, appears well hydrated and in no acute distress HEENT: atraumatic, conjunctiva  clear, no obvious abnormalities on inspection of external nose and ears OP : no lesion edema or exudate  NECK: no obvious masses on inspection palpation  LUNGS: clear to auscultation bilaterally, no wheezes, rales or  rhonchi, good air movement CV: HRRR, no clubbing cyanosis or  peripheral edema nl cap refill  MS: moves all extremities without noticeable focal  abnormality PSYCH: pleasant and cooperative, no obvious depression or anxiety Lab Results  Component Value Date   WBC 6.6 11/02/2021   HGB 13.6 11/02/2021   HCT 41.3 11/02/2021   PLT 332.0 11/02/2021   GLUCOSE 110 (H) 11/02/2021   CHOL 282 (H) 04/18/2022   TRIG 365.0 (H) 04/18/2022   HDL 77.10 04/18/2022   LDLDIRECT 170.0 04/18/2022   LDLCALC 162 (H) 07/23/2020   ALT 17 11/02/2021   AST 18 11/02/2021   NA 135 11/02/2021   K 4.2 11/02/2021   CL 99 11/02/2021   CREATININE 0.92 11/02/2021   BUN 19 11/02/2021   CO2 25 11/02/2021   TSH 2.34 11/02/2021   HGBA1C 6.6 (H) 04/18/2022   MICROALBUR 1.1 09/15/2010   BP Readings from Last 3 Encounters:  12/06/21 124/80  10/27/21 110/68  10/22/21 118/76    ASSESSMENT AND PLAN:  Discussed the following assessment and plan:  Medication management  Hyperlipidemia, unspecified hyperlipidemia type  Hyperglycemia  History of gestational diabetes  Pre-diabetes  -Patient advised to return or notify health care team  if  new concerns arise.  There are no Patient Instructions on file for this visit.   Neta Mends. Annely Sliva M.D.

## 2022-04-25 ENCOUNTER — Encounter: Payer: Self-pay | Admitting: Internal Medicine

## 2022-04-25 ENCOUNTER — Other Ambulatory Visit: Payer: Self-pay | Admitting: Internal Medicine

## 2022-04-25 ENCOUNTER — Ambulatory Visit (INDEPENDENT_AMBULATORY_CARE_PROVIDER_SITE_OTHER): Payer: 59 | Admitting: Internal Medicine

## 2022-04-25 VITALS — BP 118/80 | HR 112 | Temp 98.8°F | Ht 67.0 in | Wt 204.2 lb

## 2022-04-25 DIAGNOSIS — E669 Obesity, unspecified: Secondary | ICD-10-CM

## 2022-04-25 DIAGNOSIS — Z79899 Other long term (current) drug therapy: Secondary | ICD-10-CM | POA: Diagnosis not present

## 2022-04-25 DIAGNOSIS — Z8632 Personal history of gestational diabetes: Secondary | ICD-10-CM | POA: Diagnosis not present

## 2022-04-25 DIAGNOSIS — R739 Hyperglycemia, unspecified: Secondary | ICD-10-CM | POA: Diagnosis not present

## 2022-04-25 DIAGNOSIS — R7303 Prediabetes: Secondary | ICD-10-CM | POA: Diagnosis not present

## 2022-04-25 DIAGNOSIS — E785 Hyperlipidemia, unspecified: Secondary | ICD-10-CM | POA: Diagnosis not present

## 2022-04-25 DIAGNOSIS — E1169 Type 2 diabetes mellitus with other specified complication: Secondary | ICD-10-CM

## 2022-04-25 MED ORDER — BLOOD GLUCOSE MONITOR KIT: PACK | 99 refills | Status: DC

## 2022-04-25 MED ORDER — ROSUVASTATIN CALCIUM 10 MG PO TABS: 10.0000 mg | ORAL_TABLET | Freq: Every day | ORAL | 3 refills | Status: DC

## 2022-04-25 MED ORDER — OZEMPIC (0.25 OR 0.5 MG/DOSE) 2 MG/3ML ~~LOC~~ SOPN: 0.2500 mg | PEN_INJECTOR | SUBCUTANEOUS | 1 refills | Status: DC

## 2022-04-25 NOTE — Patient Instructions (Signed)
Begin crestor daily  Then ozempic  once a week  After  4 weeks  contact us and we can increase the dose of medication to 0.5 mg per week. Plan lab  lipid a1c in 3 mos and then ov again

## 2022-05-02 DIAGNOSIS — R69 Illness, unspecified: Secondary | ICD-10-CM | POA: Diagnosis not present

## 2022-05-02 DIAGNOSIS — F331 Major depressive disorder, recurrent, moderate: Secondary | ICD-10-CM | POA: Diagnosis not present

## 2022-05-04 ENCOUNTER — Other Ambulatory Visit: Payer: Self-pay | Admitting: Internal Medicine

## 2022-06-08 ENCOUNTER — Other Ambulatory Visit: Payer: Self-pay | Admitting: Internal Medicine

## 2022-07-05 ENCOUNTER — Other Ambulatory Visit: Payer: Self-pay | Admitting: Internal Medicine

## 2022-07-26 ENCOUNTER — Other Ambulatory Visit (INDEPENDENT_AMBULATORY_CARE_PROVIDER_SITE_OTHER): Payer: 59

## 2022-07-26 DIAGNOSIS — R739 Hyperglycemia, unspecified: Secondary | ICD-10-CM | POA: Diagnosis not present

## 2022-07-26 DIAGNOSIS — R7303 Prediabetes: Secondary | ICD-10-CM

## 2022-07-26 DIAGNOSIS — Z8632 Personal history of gestational diabetes: Secondary | ICD-10-CM | POA: Diagnosis not present

## 2022-07-26 DIAGNOSIS — E669 Obesity, unspecified: Secondary | ICD-10-CM

## 2022-07-26 DIAGNOSIS — E785 Hyperlipidemia, unspecified: Secondary | ICD-10-CM

## 2022-07-26 DIAGNOSIS — Z79899 Other long term (current) drug therapy: Secondary | ICD-10-CM | POA: Diagnosis not present

## 2022-07-26 DIAGNOSIS — E1169 Type 2 diabetes mellitus with other specified complication: Secondary | ICD-10-CM

## 2022-07-26 LAB — LIPID PANEL
Cholesterol: 162 mg/dL (ref 0–200)
HDL: 79.8 mg/dL (ref >39.00)
LDL Cholesterol: 51 mg/dL (ref 0–99)
NonHDL: 82.27
Total CHOL/HDL Ratio: 2
Triglycerides: 157 mg/dL — ABNORMAL HIGH (ref 0.0–149.0)
VLDL: 31.4 mg/dL (ref 0.0–40.0)

## 2022-07-26 LAB — HEMOGLOBIN A1C: Hgb A1c MFr Bld: 7.1 % — ABNORMAL HIGH (ref 4.6–6.5)

## 2022-07-31 NOTE — Progress Notes (Signed)
Lipids better  a1c is up   Will discuss at upcoming visit this week

## 2022-08-01 ENCOUNTER — Encounter: Payer: Self-pay | Admitting: Internal Medicine

## 2022-08-01 ENCOUNTER — Other Ambulatory Visit: Payer: Self-pay | Admitting: Internal Medicine

## 2022-08-01 ENCOUNTER — Telehealth (INDEPENDENT_AMBULATORY_CARE_PROVIDER_SITE_OTHER): Payer: 59 | Admitting: Internal Medicine

## 2022-08-01 VITALS — Ht 67.0 in | Wt 203.0 lb

## 2022-08-01 DIAGNOSIS — E785 Hyperlipidemia, unspecified: Secondary | ICD-10-CM

## 2022-08-01 DIAGNOSIS — E669 Obesity, unspecified: Secondary | ICD-10-CM | POA: Diagnosis not present

## 2022-08-01 DIAGNOSIS — Z79899 Other long term (current) drug therapy: Secondary | ICD-10-CM | POA: Diagnosis not present

## 2022-08-01 DIAGNOSIS — E1169 Type 2 diabetes mellitus with other specified complication: Secondary | ICD-10-CM | POA: Diagnosis not present

## 2022-08-01 DIAGNOSIS — Z8632 Personal history of gestational diabetes: Secondary | ICD-10-CM

## 2022-08-01 MED ORDER — OZEMPIC (0.25 OR 0.5 MG/DOSE) 2 MG/3ML ~~LOC~~ SOPN: 0.2500 mg | PEN_INJECTOR | SUBCUTANEOUS | 1 refills | Status: DC

## 2022-08-01 NOTE — Patient Instructions (Signed)
Continue lipid med :  ldl levels are good at goal  Check with insurance about  ozempic approval and GM covered options  and let us know .\ 1 mos message  on med with weight an how doing. 3 mos  on med visit

## 2022-08-01 NOTE — Progress Notes (Addendum)
Virtual Visit via Video Note  I connected with Sandra Hudson on 08/01/22 at 11:00 AM EDT by a video enabled telemedicine application and verified that I am speaking with the correct person using two identifiers. Location patient: home Location provider:work office Persons participating in the virtual visit: patient, provider  Patient aware  of the limitations of evaluation and management by telemedicine and  availability of in person appointments. and agreed to proceed.   HPI: Sandra Hudson presents for video visit  for fu of labs HLD: taking med crestor  no concerns   Prediabetes diabetes :    has hx of  gestational dm .  Didn't get  ozempic  insurance rejection not approbed in July  plans on calling now  [employer] about covered  Glucose monitoring since a1 c is not up to 7.1   ROS: See pertinent positives and negatives per HPI   Past Medical History:  Diagnosis Date   Allergic rhinitis due to dust    Allergy    Breech presentation 01/29/2014   Female infertility    Headache(784.0)    Pre-eclampsia 01/29/2014   Pregnancy induced hypertension    Status post primary low transverse cesarean section 02/01/2014    Past Surgical History:  Procedure Laterality Date   CESAREAN SECTION N/A 01/31/2014   Procedure: CESAREAN SECTION;  Surgeon: Delice Lesch, MD;  Location: [facility];  Service: Obstetrics;  Laterality: N/A;   COLONOSCOPY  09/16/2011   NASAL SEPTUM SURGERY     deviated septum and large turbinate    Family History  Problem Relation Age of Onset   Depression Father    ALS Father        multiple system atropy   Depression Mother    Breast cancer Other        maternal great aunt   Breast cancer Paternal Grandmother    Colon cancer Neg Hx    Pancreatic cancer Neg Hx    Esophageal cancer Neg Hx     Social History   Tobacco Use   Smoking status: Never   Smokeless tobacco: Never  Vaping Use   Vaping Use: Never used  Substance Use Topics   Alcohol use: No     Comment: occasionally   Drug use: No      Current Outpatient Medications:    ACCU-CHEK AVIVA PLUS test strip, DISPENSED BASED ON PATIENT INSURANCE, Disp: 1 strip, Rfl: PRN   Biotin 5 MG CAPS, Take 1 capsule by mouth daily., Disp: , Rfl:    buPROPion (WELLBUTRIN XL) 300 MG 24 hr tablet, TAKE 1 TABLET(300 MG) BY MOUTH EVERY MORNING, Disp: 90 tablet, Rfl: 0   Cholecalciferol (VITAMIN D3) 2000 units TABS, Take 1 tablet by mouth daily., Disp: , Rfl:    clobetasol cream (TEMOVATE) 0.05 %, APPLY TOPICALLY TO THE AFFECTED AREA TWICE DAILY FOR 14 DAYS, Disp: , Rfl:    DULoxetine (CYMBALTA) 30 MG capsule, TAKE 1 CAPSULE BY MOUTH EVERY DAY ALONG WITH 60MG  CAPSULE, Disp: 30 capsule, Rfl: 5   DULoxetine (CYMBALTA) 60 MG capsule, TAKE 1 CAPSULE BY MOUTH EVERY DAY, Disp: 90 capsule, Rfl: 2   eszopiclone (LUNESTA) 2 MG TABS tablet, TAKE 1 TABLET BY MOUTH EVERY NIGHT AT BEDTIME AS NEEDED FOR SLEEP, Disp: 30 tablet, Rfl: 3   ketoconazole (NIZORAL) 2 % shampoo, APPLY TOPICALLY 2 TO 3 TIMES A WEEK, Disp: , Rfl:    loratadine (CLARITIN) 10 MG tablet, Take 10 mg by mouth daily., Disp: , Rfl:  metFORMIN (GLUCOPHAGE) 850 MG tablet, TAKE 1 TABLET BY MOUTH TWICE A DAY WITH A MEAL, Disp: 60 tablet, Rfl: 2   MILI 0.25-35 MG-MCG tablet, , Disp: , Rfl:    polyethylene glycol (MIRALAX) 17 g packet, Take 17 g by mouth daily., Disp: 14 each, Rfl: 0   rosuvastatin (CRESTOR) 10 MG tablet, Take 1 tablet (10 mg total) by mouth daily., Disp: 30 tablet, Rfl: 3   Semaglutide,0.25 or 0.5MG /DOS, (OZEMPIC, 0.25 OR 0.5 MG/DOSE,) 2 MG/3ML SOPN, Inject 0.25 mg into the skin once a week. For diabetes management, Disp: 3 mL, Rfl: 1   spironolactone (ALDACTONE) 100 MG tablet, Take 100 mg by mouth 2 (two) times daily., Disp: , Rfl:    SUMAtriptan (IMITREX) 100 MG tablet, TAKE 1 TABLET BY MOUTH AT ONSET OF MIGRAINE AND MAY REPEAT IN 2 HOURS ID HEADACHE PERSISTS OR RECURS, Disp: 12 tablet, Rfl: 1   traZODone (DESYREL) 50 MG tablet, TAKE 1/2  TO 1 TABLET BY MOUTH AT BEDTIME AS DIRECTED, Disp: 90 tablet, Rfl: 1   triamcinolone cream (KENALOG) 0.1 %, triamcinolone acetonide 0.1 % topical cream  APPLY EXTERNALLY TO THE AFFECTED AREA TWICE DAILY FOR 15 DAYS, Disp: , Rfl:    zinc gluconate 50 MG tablet, Take 50 mg by mouth daily., Disp: , Rfl:   EXAM: BP Readings from Last 3 Encounters:  04/25/22 118/80  12/06/21 124/80  10/27/21 110/68   203 is weight    GENERAL: alert, oriented, appears well and in no acute distress  HEENT: atraumatic, conjunttiva clear, no obvious abnormalities on inspection of external nose and ears  NECK: normal movements of the head and neck  LUNGS: on inspection no signs of respiratory distress, breathing rate appears normal, no obvious gross SOB, gasping or wheezing  CV: no obvious cyanosis  PSYCH/NEURO: pleasant and cooperative, no obvious depression or anxiety, speech and thought processing grossly intact  Lab Results  Component Value Date   WBC 6.6 11/02/2021   HGB 13.6 11/02/2021   HCT 41.3 11/02/2021   PLT 332.0 11/02/2021   GLUCOSE 110 (H) 11/02/2021   CHOL 162 07/26/2022   TRIG 157.0 (H) 07/26/2022   HDL 79.80 07/26/2022   LDLDIRECT 170.0 04/18/2022   LDLCALC 51 07/26/2022   ALT 17 11/02/2021   AST 18 11/02/2021   NA 135 11/02/2021   K 4.2 11/02/2021   CL 99 11/02/2021   CREATININE 0.92 11/02/2021   BUN 19 11/02/2021   CO2 25 11/02/2021   TSH 2.34 11/02/2021   HGBA1C 7.1 (H) 07/26/2022   MICROALBUR 1.1 09/15/2010   Lab results reviewed  ASSESSMENT AND PLAN:  Discussed the following assessment and plan:    ICD-10-CM   1. Diabetes mellitus type 2 in obese (HCC)  E11.69    E66.9     2. Medication management  Z79.899     3. Hyperlipidemia, unspecified hyperlipidemia type  E78.5    continue  medication crestor    4. History of gestational diabetes  Z22.32       Counseled.  Risk benefit of medication discussed Re rx ozempic since now in diabetic range and hopefully  PA will go through  if not other options moounjaro or trulicity  J2E now is dm range .   Ldl much better   at goal 51   BP  not checking has been good   Expectant management and discussion of plan and treatment with opportunity to ask questions and all were answered. The patient agreed with the plan and  demonstrated an understanding of the instructions.   Advised to call back or seek an in-person evaluation if worsening  or having  further concerns  in interim. Return for update how doing in a month and visit  virtual ok in 3 mos on med .    Shanon Ace, MD

## 2022-08-07 ENCOUNTER — Other Ambulatory Visit: Payer: Self-pay | Admitting: Internal Medicine

## 2022-08-08 ENCOUNTER — Other Ambulatory Visit: Payer: Self-pay | Admitting: Internal Medicine

## 2022-09-13 DIAGNOSIS — R69 Illness, unspecified: Secondary | ICD-10-CM | POA: Diagnosis not present

## 2022-09-13 DIAGNOSIS — F411 Generalized anxiety disorder: Secondary | ICD-10-CM | POA: Diagnosis not present

## 2022-09-26 ENCOUNTER — Other Ambulatory Visit: Payer: Self-pay | Admitting: Internal Medicine

## 2022-10-27 DIAGNOSIS — H52223 Regular astigmatism, bilateral: Secondary | ICD-10-CM | POA: Diagnosis not present

## 2022-10-27 DIAGNOSIS — H5213 Myopia, bilateral: Secondary | ICD-10-CM | POA: Diagnosis not present

## 2022-10-27 DIAGNOSIS — Z7984 Long term (current) use of oral hypoglycemic drugs: Secondary | ICD-10-CM | POA: Diagnosis not present

## 2022-10-27 DIAGNOSIS — H524 Presbyopia: Secondary | ICD-10-CM | POA: Diagnosis not present

## 2022-10-27 DIAGNOSIS — E119 Type 2 diabetes mellitus without complications: Secondary | ICD-10-CM | POA: Diagnosis not present

## 2022-10-27 LAB — HM DIABETES EYE EXAM

## 2022-11-08 ENCOUNTER — Other Ambulatory Visit: Payer: Self-pay | Admitting: Internal Medicine

## 2022-12-08 ENCOUNTER — Other Ambulatory Visit: Payer: Self-pay | Admitting: Internal Medicine

## 2022-12-12 ENCOUNTER — Other Ambulatory Visit: Payer: Self-pay | Admitting: Internal Medicine

## 2022-12-12 DIAGNOSIS — F411 Generalized anxiety disorder: Secondary | ICD-10-CM | POA: Diagnosis not present

## 2022-12-12 DIAGNOSIS — R69 Illness, unspecified: Secondary | ICD-10-CM | POA: Diagnosis not present

## 2023-01-09 DIAGNOSIS — F331 Major depressive disorder, recurrent, moderate: Secondary | ICD-10-CM | POA: Diagnosis not present

## 2023-01-09 DIAGNOSIS — F411 Generalized anxiety disorder: Secondary | ICD-10-CM | POA: Diagnosis not present

## 2023-01-10 ENCOUNTER — Other Ambulatory Visit: Payer: Self-pay | Admitting: Internal Medicine

## 2023-02-03 ENCOUNTER — Telehealth: Payer: Self-pay

## 2023-02-03 NOTE — Telephone Encounter (Signed)
Received a fax from [facility] requesting new prescription of the following:  -Accu-chek Guide Test strip - Accu-Chek softclix lancet   Last visit: 08/01/2022 Please advise.

## 2023-02-06 ENCOUNTER — Other Ambulatory Visit (HOSPITAL_COMMUNITY): Payer: Self-pay

## 2023-02-06 ENCOUNTER — Telehealth: Payer: Self-pay | Admitting: Internal Medicine

## 2023-02-06 ENCOUNTER — Telehealth: Payer: Self-pay

## 2023-02-06 DIAGNOSIS — F331 Major depressive disorder, recurrent, moderate: Secondary | ICD-10-CM | POA: Diagnosis not present

## 2023-02-06 DIAGNOSIS — F411 Generalized anxiety disorder: Secondary | ICD-10-CM | POA: Diagnosis not present

## 2023-02-06 NOTE — Telephone Encounter (Signed)
PA submitted via CMM. Key: [id number]. Status pending.

## 2023-02-06 NOTE — Telephone Encounter (Signed)
Pharmacy Patient Advocate Encounter   Received notification from [facility] that prior authorization for Ozempic (0.25 or 0.5 MG/DOSE) /3ML pen-injectors  is required/requested.  Per Test Claim: Plan/benefit exclusion   PA submitted on 02/06/23 to (ins) [employer] via CoverMyMeds [employer] or [facility]) confirmation # [id number] Status is pending

## 2023-02-06 NOTE — Telephone Encounter (Signed)
Pt is calling and still waiting on PA for Semaglutide,0.25 or 0.5MG /DOS, (OZEMPIC, 0.25 OR 0.5 MG/DOSE,) 2 MG/3ML SOPN

## 2023-02-07 MED ORDER — ACCU-CHEK AVIVA PLUS VI STRP: 1.0000 | ORAL_STRIP | 12 refills | Status: DC | PRN

## 2023-02-07 MED ORDER — ACCU-CHEK SOFTCLIX LANCETS MISC: 12 refills | Status: AC

## 2023-02-07 NOTE — Telephone Encounter (Signed)
Pharmacy Patient Advocate Encounter  Received notification from [facility] that the request for prior authorization for Ozempic (0.25 or 0.5 MG/DOSE) /3ML  has been denied due to the member has not tried and failed the required number of formulary alternatives.     Please be advised we currently do not have a Pharmacist to review denials, therefore you will need to process appeals accordingly as needed. Thanks for your support at this time.   You may call [phone] or fax  [phone], to appeal.

## 2023-02-07 NOTE — Telephone Encounter (Signed)
PA denied for not trying and failing Trulicity and Victoza. Routed telephone encounter to [place] pool for more information.

## 2023-02-07 NOTE — Addendum Note (Signed)
Addended by: Vickii Chafe on: 02/07/2023 08:46 AM   Modules accepted: Orders

## 2023-02-08 NOTE — Telephone Encounter (Signed)
See other note

## 2023-02-08 NOTE — Telephone Encounter (Signed)
Tell patient what insurance said  have to try trulicity or victoza first and "fail.   Trulicity is a weekly shot assoc with some weight loss but not as much as ozempic .  Does she want [place] to send in truylicity ( same class of med and a  helpful med ?)

## 2023-02-09 MED ORDER — TRULICITY 0.75 MG/0.5ML ~~LOC~~ SOAJ: 0.7500 mg | SUBCUTANEOUS | 1 refills | Status: DC

## 2023-02-09 NOTE — Addendum Note (Signed)
Addended byBerniece Andreas K on: 02/09/2023 12:24 PM   Modules accepted: Orders

## 2023-02-09 NOTE — Telephone Encounter (Signed)
Attempted to reach pt. Left a voicemail to call back.  

## 2023-02-13 NOTE — Telephone Encounter (Signed)
Attempted to reach pt. Voicemail is full. Will try again.

## 2023-02-17 ENCOUNTER — Other Ambulatory Visit: Payer: Self-pay

## 2023-03-22 ENCOUNTER — Other Ambulatory Visit: Payer: Self-pay

## 2023-03-22 MED ORDER — ACCU-CHEK GUIDE VI STRP: ORAL_STRIP | 12 refills | Status: AC

## 2023-03-28 ENCOUNTER — Other Ambulatory Visit: Payer: Self-pay | Admitting: Family

## 2023-04-02 ENCOUNTER — Other Ambulatory Visit: Payer: Self-pay | Admitting: Family

## 2023-04-03 DIAGNOSIS — F331 Major depressive disorder, recurrent, moderate: Secondary | ICD-10-CM | POA: Diagnosis not present

## 2023-04-03 DIAGNOSIS — F411 Generalized anxiety disorder: Secondary | ICD-10-CM | POA: Diagnosis not present

## 2023-04-06 ENCOUNTER — Other Ambulatory Visit: Payer: Self-pay | Admitting: Family

## 2023-04-10 DIAGNOSIS — E119 Type 2 diabetes mellitus without complications: Secondary | ICD-10-CM | POA: Diagnosis not present

## 2023-04-10 DIAGNOSIS — I10 Essential (primary) hypertension: Secondary | ICD-10-CM | POA: Diagnosis not present

## 2023-04-10 DIAGNOSIS — Z1231 Encounter for screening mammogram for malignant neoplasm of breast: Secondary | ICD-10-CM | POA: Diagnosis not present

## 2023-04-10 DIAGNOSIS — Z01419 Encounter for gynecological examination (general) (routine) without abnormal findings: Secondary | ICD-10-CM | POA: Diagnosis not present

## 2023-04-10 DIAGNOSIS — Z309 Encounter for contraceptive management, unspecified: Secondary | ICD-10-CM | POA: Diagnosis not present

## 2023-04-10 LAB — HM MAMMOGRAPHY

## 2023-04-13 ENCOUNTER — Other Ambulatory Visit: Payer: Self-pay | Admitting: Internal Medicine

## 2023-04-13 NOTE — Telephone Encounter (Signed)
Attempted to reach pt to schedule a follow up appt twice. Call cannot be completed. Will try again.

## 2023-05-15 ENCOUNTER — Telehealth: Payer: Self-pay | Admitting: Internal Medicine

## 2023-05-15 ENCOUNTER — Other Ambulatory Visit: Payer: Self-pay | Admitting: Family

## 2023-05-15 DIAGNOSIS — Z0000 Encounter for general adult medical examination without abnormal findings: Secondary | ICD-10-CM

## 2023-05-15 DIAGNOSIS — E782 Mixed hyperlipidemia: Secondary | ICD-10-CM

## 2023-05-15 DIAGNOSIS — E669 Obesity, unspecified: Secondary | ICD-10-CM

## 2023-05-15 DIAGNOSIS — Z79899 Other long term (current) drug therapy: Secondary | ICD-10-CM

## 2023-05-15 DIAGNOSIS — Z8632 Personal history of gestational diabetes: Secondary | ICD-10-CM

## 2023-05-15 DIAGNOSIS — E785 Hyperlipidemia, unspecified: Secondary | ICD-10-CM

## 2023-05-15 DIAGNOSIS — R739 Hyperglycemia, unspecified: Secondary | ICD-10-CM

## 2023-05-15 MED ORDER — SUMATRIPTAN SUCCINATE 100 MG PO TABS: ORAL_TABLET | ORAL | 1 refills | Status: DC

## 2023-05-15 MED ORDER — ROSUVASTATIN CALCIUM 10 MG PO TABS: 10.0000 mg | ORAL_TABLET | Freq: Every day | ORAL | 3 refills | Status: DC

## 2023-05-15 NOTE — Telephone Encounter (Addendum)
Pt has already been scheduled for a CPE on 06/20/23.  Pt is requesting refills of the following, just until she comes in for her CPE:  SUMAtriptan (IMITREX) 100 MG tablet  rosuvastatin (CRESTOR) 10 MG tablet  Pt would also like to know if she can come in a week or a few days prior to her CPE to get her labs done?  Please advise.    [id number] [place] [place] - [address] AT [address] Phone: [phone]  Fax: [phone]

## 2023-05-18 NOTE — Addendum Note (Signed)
Addended byMadelin Headings on: 05/18/2023 09:38 AM   Modules accepted: Orders

## 2023-05-18 NOTE — Telephone Encounter (Signed)
Future orders place for upcoming cpe and med management c Can get lab appt pre visit

## 2023-05-22 NOTE — Telephone Encounter (Signed)
Spoke to patient. Pt is aware and lab appt is scheduled.

## 2023-06-01 ENCOUNTER — Encounter (INDEPENDENT_AMBULATORY_CARE_PROVIDER_SITE_OTHER): Payer: Self-pay

## 2023-06-13 ENCOUNTER — Other Ambulatory Visit (INDEPENDENT_AMBULATORY_CARE_PROVIDER_SITE_OTHER): Payer: Self-pay

## 2023-06-13 DIAGNOSIS — Z0000 Encounter for general adult medical examination without abnormal findings: Secondary | ICD-10-CM

## 2023-06-13 DIAGNOSIS — Z79899 Other long term (current) drug therapy: Secondary | ICD-10-CM | POA: Diagnosis not present

## 2023-06-13 DIAGNOSIS — E669 Obesity, unspecified: Secondary | ICD-10-CM

## 2023-06-13 DIAGNOSIS — E785 Hyperlipidemia, unspecified: Secondary | ICD-10-CM | POA: Diagnosis not present

## 2023-06-13 DIAGNOSIS — E782 Mixed hyperlipidemia: Secondary | ICD-10-CM

## 2023-06-13 DIAGNOSIS — R739 Hyperglycemia, unspecified: Secondary | ICD-10-CM

## 2023-06-13 DIAGNOSIS — Z8632 Personal history of gestational diabetes: Secondary | ICD-10-CM

## 2023-06-13 LAB — BASIC METABOLIC PANEL
BUN: 23 mg/dL (ref 6–23)
CO2: 26 mEq/L (ref 19–32)
Calcium: 9.9 mg/dL (ref 8.4–10.5)
Chloride: 99 mEq/L (ref 96–112)
Creatinine, Ser: 1.01 mg/dL (ref 0.40–1.20)
GFR: 67.13 mL/min (ref >60.00)
Glucose, Bld: 93 mg/dL (ref 70–99)
Potassium: 4.3 mEq/L (ref 3.5–5.1)
Sodium: 135 mEq/L (ref 135–145)

## 2023-06-13 LAB — HEPATIC FUNCTION PANEL
ALT: 22 U/L (ref 0–35)
AST: 20 U/L (ref 0–37)
Albumin: 3.9 g/dL (ref 3.5–5.2)
Alkaline Phosphatase: 57 U/L (ref 39–117)
Bilirubin, Direct: 0.1 mg/dL (ref 0.0–0.3)
Total Bilirubin: 0.3 mg/dL (ref 0.2–1.2)
Total Protein: 7.6 g/dL (ref 6.0–8.3)

## 2023-06-13 LAB — CBC WITH DIFFERENTIAL/PLATELET
Basophils Absolute: 0.1 10*3/uL (ref 0.0–0.1)
Basophils Relative: 0.7 % (ref 0.0–3.0)
Eosinophils Absolute: 0.4 10*3/uL (ref 0.0–0.7)
Eosinophils Relative: 4.9 % (ref 0.0–5.0)
HCT: 43.5 % (ref 36.0–46.0)
Hemoglobin: 14 g/dL (ref 12.0–15.0)
Lymphocytes Relative: 30.3 % (ref 12.0–46.0)
Lymphs Abs: 2.5 10*3/uL (ref 0.7–4.0)
MCHC: 32.1 g/dL (ref 30.0–36.0)
MCV: 85.8 fl (ref 78.0–100.0)
Monocytes Absolute: 0.7 10*3/uL (ref 0.1–1.0)
Monocytes Relative: 9 % (ref 3.0–12.0)
Neutro Abs: 4.6 10*3/uL (ref 1.4–7.7)
Neutrophils Relative %: 55.1 % (ref 43.0–77.0)
Platelets: 334 10*3/uL (ref 150.0–400.0)
RBC: 5.07 Mil/uL (ref 3.87–5.11)
RDW: 14.1 % (ref 11.5–15.5)
WBC: 8.3 10*3/uL (ref 4.0–10.5)

## 2023-06-13 LAB — VITAMIN B12: Vitamin B-12: 317 pg/mL (ref 211–911)

## 2023-06-13 LAB — LIPID PANEL
Cholesterol: 185 mg/dL (ref 0–200)
HDL: 82.5 mg/dL (ref >39.00)
LDL Cholesterol: 79 mg/dL (ref 0–99)
NonHDL: 102.23
Total CHOL/HDL Ratio: 2
Triglycerides: 115 mg/dL (ref 0.0–149.0)
VLDL: 23 mg/dL (ref 0.0–40.0)

## 2023-06-13 LAB — TSH: TSH: 3.45 u[IU]/mL (ref 0.35–5.50)

## 2023-06-13 LAB — MICROALBUMIN / CREATININE URINE RATIO
Creatinine,U: 477.6 mg/dL
Microalb Creat Ratio: 1 mg/g (ref 0.0–30.0)
Microalb, Ur: 4.9 mg/dL — ABNORMAL HIGH (ref 0.0–1.9)

## 2023-06-13 LAB — HEMOGLOBIN A1C: Hgb A1c MFr Bld: 6.1 % (ref 4.6–6.5)

## 2023-06-15 NOTE — Progress Notes (Signed)
Results improved  will review at upcoming visit

## 2023-06-20 ENCOUNTER — Ambulatory Visit (INDEPENDENT_AMBULATORY_CARE_PROVIDER_SITE_OTHER): Payer: 59 | Admitting: Internal Medicine

## 2023-06-20 ENCOUNTER — Encounter: Payer: Self-pay | Admitting: Internal Medicine

## 2023-06-20 VITALS — BP 106/76 | HR 90 | Temp 98.0°F | Ht 66.99 in | Wt 206.8 lb

## 2023-06-20 DIAGNOSIS — Z6832 Body mass index (BMI) 32.0-32.9, adult: Secondary | ICD-10-CM

## 2023-06-20 DIAGNOSIS — E669 Obesity, unspecified: Secondary | ICD-10-CM | POA: Diagnosis not present

## 2023-06-20 DIAGNOSIS — G4700 Insomnia, unspecified: Secondary | ICD-10-CM | POA: Diagnosis not present

## 2023-06-20 DIAGNOSIS — F5104 Psychophysiologic insomnia: Secondary | ICD-10-CM

## 2023-06-20 DIAGNOSIS — Z0000 Encounter for general adult medical examination without abnormal findings: Secondary | ICD-10-CM | POA: Diagnosis not present

## 2023-06-20 DIAGNOSIS — E785 Hyperlipidemia, unspecified: Secondary | ICD-10-CM | POA: Diagnosis not present

## 2023-06-20 DIAGNOSIS — E118 Type 2 diabetes mellitus with unspecified complications: Secondary | ICD-10-CM

## 2023-06-20 DIAGNOSIS — Z23 Encounter for immunization: Secondary | ICD-10-CM | POA: Diagnosis not present

## 2023-06-20 DIAGNOSIS — Z79899 Other long term (current) drug therapy: Secondary | ICD-10-CM | POA: Diagnosis not present

## 2023-06-20 MED ORDER — TRULICITY 1.5 MG/0.5ML ~~LOC~~ SOAJ: 1.5000 mg | SUBCUTANEOUS | 5 refills | Status: DC

## 2023-06-20 NOTE — Progress Notes (Signed)
Chief Complaint  Patient presents with   Annual Exam    HPI: Patient  Sandra Hudson  47 y.o. comes in today for Preventive Health Care visit  And Chronic disease management   DM 0.75  trulicity  and metformin tolerating ( semiglutide was denied by insurance first has to fail other meds)   Mood: hx of depression  and sleeplessness insomnia  changed from cymbalta to prozac and seems better  and friends supportive.  Girls scouts  routine .  Prescriber every 3-4 months or monthly   Migraines  :increased recently. Off massage therapy then had long one in June shoulder and neck triggers  Sees gyne  uses imitrex  for HAs    Health Maintenance  Topic Date Due   FOOT EXAM  Never done   PAP SMEAR-Modifier  08/27/2021   COVID-19 Vaccine (4 - 2023-24 season) 07/06/2023 (Originally 06/18/2023)   OPHTHALMOLOGY EXAM  10/28/2023   HEMOGLOBIN A1C  12/14/2023   DTaP/Tdap/Td (3 - Td or Tdap) 02/02/2024   Diabetic kidney evaluation - eGFR measurement  06/12/2024   Diabetic kidney evaluation - Urine ACR  06/12/2024   Colonoscopy  09/21/2030   INFLUENZA VACCINE  Completed   Hepatitis C Screening  Completed   HIV Screening  Completed   HPV VACCINES  Aged Out   Health Maintenance Review LIFESTYLE:  Exercise:  not much  Tobacco/ETS:no Alcohol:  no Sugar beverages: zero sodas.  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      ROS:  REST of 12 system review negative except as per HPI no cv  resp issues    Past Medical History:  Diagnosis Date   Allergic rhinitis due to dust    Allergy    Breech presentation 01/29/2014   Female infertility    Headache(784.0)    Pre-eclampsia 01/29/2014   Pregnancy induced hypertension    Status post primary low transverse cesarean section 02/01/2014    Past Surgical History:  Procedure Laterality Date   CESAREAN SECTION N/A 01/31/2014   Procedure: CESAREAN SECTION;  Surgeon: Purcell Nails, MD;  Location: [facility];  Service: Obstetrics;  Laterality: N/A;   COLONOSCOPY  09/16/2011   NASAL SEPTUM SURGERY     deviated septum and large turbinate    Family History  Problem Relation Age of Onset   Depression Father    ALS Father        multiple system atropy   Depression Mother    Breast cancer Other        maternal great aunt   Breast cancer Paternal Grandmother    Colon cancer Neg Hx    Pancreatic cancer Neg Hx    Esophageal cancer Neg Hx     Social History   Socioeconomic History   Marital status: Married    Spouse name: Not on file   Number of children: 1   Years of education: Not on file   Highest education level: Bachelor's degree (e.g., BA, AB, BS)  Occupational History    Employer: UNEMPLOYED  Tobacco Use   Smoking status: Never   Smokeless tobacco: Never  Vaping Use   Vaping status: Never Used  Substance and Sexual Activity   Alcohol use: No    Comment: occasionally   Drug use: No   Sexual activity: Not on file  Other Topics Concern   Not  on file  Social History Narrative   HH of 4   1 Cat   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   Social Determinants of Health   Financial Resource Strain: Medium Risk (04/24/2022)   Overall Financial Resource Strain (CARDIA)    Difficulty of Paying Living Expenses: Somewhat hard  Food Insecurity: No Food Insecurity (04/24/2022)   Hunger Vital Sign    Worried About Running Out of Food in the Last Year: Never true    Ran Out of Food in the Last Year: Never true  Transportation Needs: No Transportation Needs (04/24/2022)   PRAPARE - Administrator, Civil Service (Medical): No    Lack of Transportation (Non-Medical): No  Physical Activity: Inactive (06/20/2023)   Exercise Vital Sign    Days of Exercise per Week: 0 days    Minutes of Exercise per Session: 0 min  Stress: Stress Concern Present (06/20/2023)   [employer] of Occupational Health -  Occupational Stress Questionnaire    Feeling of Stress : To some extent  Social Connections: Moderately Isolated (06/20/2023)   Social Connection and Isolation Panel [NHANES]    Frequency of Communication with Friends and Family: More than three times a week    Frequency of Social Gatherings with Friends and Family: Once a week    Attends Religious Services: Never    Database administrator or Organizations: No    Attends Engineer, structural: Never    Marital Status: Married    Outpatient Medications Prior to Visit  Medication Sig Dispense Refill   Accu-Chek Softclix Lancets lancets Use as instructed 100 each 12   Biotin 5 MG CAPS Take 1 capsule by mouth daily.     buPROPion (WELLBUTRIN XL) 300 MG 24 hr tablet TAKE 1 TABLET(300 MG) BY MOUTH EVERY MORNING 90 tablet 0   Cholecalciferol (VITAMIN D3) 2000 units TABS Take 1 tablet by mouth daily.     clobetasol cream (TEMOVATE) 0.05 % APPLY TOPICALLY TO THE AFFECTED AREA TWICE DAILY FOR 14 DAYS     eszopiclone (LUNESTA) 2 MG TABS tablet TAKE 1 TABLET BY MOUTH EVERY NIGHT AT BEDTIME AS NEEDED FOR SLEEP 30 tablet 3   finasteride (PROSCAR) 5 MG tablet Take 5 mg by mouth daily. From Dermatologist     FLUoxetine (PROZAC) 20 MG capsule Take 60 mg by mouth every morning.     glucose blood (ACCU-CHEK GUIDE) test strip Use as instructed 100 each 12   ketoconazole (NIZORAL) 2 % shampoo APPLY TOPICALLY 2 TO 3 TIMES A WEEK     loratadine (CLARITIN) 10 MG tablet Take 10 mg by mouth daily.     metFORMIN (GLUCOPHAGE) 850 MG tablet TAKE 1 TABLET BY MOUTH TWICE A DAY WITH FOOD SCHEDULE ANNUAL APPT FOR FUTURE REFILLS 180 tablet 1   MILI 0.25-35 MG-MCG tablet      rosuvastatin (CRESTOR) 10 MG tablet Take 1 tablet (10 mg total) by mouth daily. 30 tablet 3   spironolactone (ALDACTONE) 100 MG tablet Take 100 mg by mouth 2 (two) times daily.     SUMAtriptan (IMITREX) 100 MG tablet TAKE 1 TABLET BY MOUTH AT ONSET OF MIGRAINE AND MAY REPEAT IN 2 HOURS IF  HEADACHE PERSISTS OR RECURS 12 tablet 1   triamcinolone cream (KENALOG) 0.1 % triamcinolone acetonide 0.1 % topical cream  APPLY EXTERNALLY TO THE AFFECTED AREA TWICE DAILY FOR 15 DAYS  zinc gluconate 50 MG tablet Take 50 mg by mouth daily.     Dulaglutide (TRULICITY) 0.75 MG/0.5ML SOPN INJECT 0.75 MG INTO THE SKIN ONCE A WEEK 2 mL 1   DULoxetine (CYMBALTA) 30 MG capsule TAKE 1 CAPSULE BY MOUTH EVERY DAY ALONG WITH 60MG  CAPSULE (Patient not taking: Reported on 06/20/2023) 30 capsule 5   DULoxetine (CYMBALTA) 60 MG capsule TAKE 1 CAPSULE BY MOUTH EVERY DAY (Patient not taking: Reported on 06/20/2023) 30 capsule 8   polyethylene glycol (MIRALAX) 17 g packet Take 17 g by mouth daily. (Patient not taking: Reported on 06/20/2023) 14 each 0   traZODone (DESYREL) 50 MG tablet TAKE 1/2 TO 1 TABLET BY MOUTH AT BEDTIME AS DIRECTED 90 tablet 1   No facility-administered medications prior to visit.     EXAM:  BP 106/76 (BP Location: Right Arm, Patient Position: Sitting, Cuff Size: Large)   Pulse 90   Temp 98 F (36.7 C) (Oral)   Ht 5' 6.99" (1.702 m)   Wt 206 lb 12.8 oz (93.8 kg)   LMP 06/08/2023 (Exact Date)   SpO2 99%   BMI 32.40 kg/m   Body mass index is 32.4 kg/m. Wt Readings from Last 3 Encounters:  06/20/23 206 lb 12.8 oz (93.8 kg)  08/01/22 203 lb (92.1 kg)  04/25/22 204 lb 3.2 oz (92.6 kg)    Physical Exam: Vital signs reviewed ZOX:WRUE is a well-developed well-nourished alert cooperative    who appearsr stated age in no acute distress.  HEENT: normocephalic atraumatic , Eyes: PERRL EOM's full, conjunctiva clear, Nares: paten,t no deformity discharge or tenderness., Ears: no deformity EAC's clear TMs with normal landmarks. Mouth: clear OP, no lesions, edema.  Moist mucous membranes. Dentition in adequate repair. NECK: supple without masses, thyromegaly or bruits. CHEST/PULM:  Clear to auscultation and percussion breath sounds equal no wheeze , rales or rhonchi. No chest wall  deformities or tenderness. Breast: normal by inspection . No dimpling, discharge, masses, tenderness or discharge . CV: PMI is nondisplaced, S1 S2 no gallops, murmurs, rubs. Peripheral pulses are full without delay.No JVD .  ABDOMEN: Bowel sounds normal nontender  No guard or rebound, no hepato splenomegal no CVA tenderness.   Extremtities:  No clubbing cyanosis or edema, no acute joint swelling or redness no focal atrophy NEURO:  Oriented x3, cranial nerves 3-12 appear to be intact, no obvious focal weakness,gait within normal limits no abnormal reflexes or asymmetrical SKIN: No acute rashes normal turgor, color, no bruising or petechiae. PSYCH: Oriented, good eye contact, no obvious depression anxiety, cognition and judgment appear normal. LN: no cervical axillary adenopathy Diabetic Foot Exam - Simple   Simple Foot Form  06/20/2023 11:18 AM  Visual Inspection No deformities, no ulcerations, no other skin breakdown bilaterally: Yes Sensation Testing Intact to touch and monofilament testing bilaterally: Yes Pulse Check Posterior Tibialis and Dorsalis pulse intact bilaterally: Yes Comments     Lab Results  Component Value Date   WBC 8.3 06/13/2023   HGB 14.0 06/13/2023   HCT 43.5 06/13/2023   PLT 334.0 06/13/2023   GLUCOSE 93 06/13/2023   CHOL 185 06/13/2023   TRIG 115.0 06/13/2023   HDL 82.50 06/13/2023   LDLDIRECT 170.0 04/18/2022   LDLCALC 79 06/13/2023   ALT 22 06/13/2023   AST 20 06/13/2023   NA 135 06/13/2023   K 4.3 06/13/2023   CL 99 06/13/2023   CREATININE 1.01 06/13/2023   BUN 23 06/13/2023   CO2 26 06/13/2023   TSH 3.45 06/13/2023  HGBA1C 6.1 06/13/2023   MICROALBUR 4.9 (H) 06/13/2023    BP Readings from Last 3 Encounters:  06/20/23 106/76  04/25/22 118/80  12/06/21 124/80    Lab results reviewed with patient   ASSESSMENT AND PLAN:  Discussed the following assessment and plan:    ICD-10-CM   1. Encounter for annual physical exam  Z00.00 Flu  vaccine trivalent PF, 6mos and older(Flulaval,Afluria,Fluarix,Fluzone)    2. Encounter for preventive health examination  Z00.00     3. Medication management  Z79.899     4. Controlled type 2 diabetes mellitus with complication, without long-term current use of insulin (HCC)  E11.8     5. Psychophysiological insomnia  F51.04     6. Obesity with serious comorbidity, unspecified classification, unspecified obesity type  E66.9     7. Flu vaccine need  Z23 Flu vaccine trivalent PF, 6mos and older(Flulaval,Afluria,Fluarix,Fluzone)    8. Hyperlipidemia, unspecified hyperlipidemia type  E78.5     9. Insomnia, unspecified type  G47.00     Dm level much better   Would prefer also weight loss to help  Will inc dose  turlicity and fu  Mood improved at this time  Has on going  Return in about 4 months (around 10/20/2023) for 3-4 months .  Patient Care Team: [name], MD as PCP - Amedeo Gory, MD Nigel Bridgeman, CNM as Midwife Pyrtle, Carie Caddy, MD (Gastroenterology) [place], Elvin So, MD as Referring Physician (Dermatology) Patient Instructions  Good to see you today  Inc trulicity  dosing.  Take multivits with b12 in it.   Continue lifestyle intervention healthy eating and exercise .  Plan fu in 3-4 months and go from there   Adrian K. Telicia Hodgkiss M.D.

## 2023-06-20 NOTE — Patient Instructions (Signed)
Good to see you today  Inc trulicity  dosing.  Take multivits with b12 in it.   Continue lifestyle intervention healthy eating and exercise .  Plan fu in 3-4 months and go from there

## 2023-07-12 ENCOUNTER — Other Ambulatory Visit: Payer: Self-pay | Admitting: Family

## 2023-07-17 DIAGNOSIS — F331 Major depressive disorder, recurrent, moderate: Secondary | ICD-10-CM | POA: Diagnosis not present

## 2023-07-17 DIAGNOSIS — F411 Generalized anxiety disorder: Secondary | ICD-10-CM | POA: Diagnosis not present

## 2023-08-15 DIAGNOSIS — F331 Major depressive disorder, recurrent, moderate: Secondary | ICD-10-CM | POA: Diagnosis not present

## 2023-08-15 DIAGNOSIS — F411 Generalized anxiety disorder: Secondary | ICD-10-CM | POA: Diagnosis not present

## 2023-08-16 ENCOUNTER — Other Ambulatory Visit: Payer: Self-pay | Admitting: Family

## 2023-09-25 ENCOUNTER — Other Ambulatory Visit: Payer: Self-pay

## 2023-09-25 MED ORDER — SUMATRIPTAN SUCCINATE 100 MG PO TABS: ORAL_TABLET | ORAL | 1 refills | Status: DC

## 2023-09-25 MED ORDER — ROSUVASTATIN CALCIUM 10 MG PO TABS: 10.0000 mg | ORAL_TABLET | Freq: Every day | ORAL | 3 refills | Status: DC

## 2023-10-23 NOTE — Progress Notes (Signed)
 Chief Complaint  Patient presents with   Medical Management of Chronic Issues    HPI: Sandra Hudson 48 y.o. come in for Chronic disease management  DM: and obesity   Need hg A1c on trulicity    nose  noted  weight stable. On 1.5 Am glucoses 100- 120 -30 range  Mood coping  working on daily efforts  using self help app  ROS: See pertinent positives and negatives per HPI. Ocass ha related to menses  No cv sx  no new syncope dizziness and always had ortho sx short lived  Past Medical History:  Diagnosis Date   Allergic rhinitis due to dust    Allergy    Breech presentation 01/29/2014   Female infertility    Headache(784.0)    Pre-eclampsia 01/29/2014   Pregnancy induced hypertension    Status post primary low transverse cesarean section 02/01/2014    Family History  Problem Relation Age of Onset   Depression Father    ALS Father        multiple system atropy   Depression Mother    Breast cancer Other        maternal great aunt   Breast cancer Paternal Grandmother    Colon cancer Neg Hx    Pancreatic cancer Neg Hx    Esophageal cancer Neg Hx     Social History   Socioeconomic History   Marital status: Married    Spouse name: Not on file   Number of children: 1   Years of education: Not on file   Highest education level: Bachelor's degree (e.g., BA, AB, BS)  Occupational History    Employer: UNEMPLOYED  Tobacco Use   Smoking status: Never   Smokeless tobacco: Never  Vaping Use   Vaping status: Never Used  Substance and Sexual Activity   Alcohol use: No    Comment: occasionally   Drug use: No   Sexual activity: Not on file  Other Topics Concern   Not on file  Social History Narrative   HH of 4   1 Cat   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   Social Drivers of Health   Financial Resource Strain: Medium Risk (10/23/2023)   Overall Financial Resource Strain (CARDIA)    Difficulty of Paying Living Expenses:  Somewhat hard  Food Insecurity: No Food Insecurity (10/23/2023)   Hunger Vital Sign    Worried About Running Out of Food in the Last Year: Never true    Ran Out of Food in the Last Year: Never true  Transportation Needs: No Transportation Needs (10/23/2023)   PRAPARE - Administrator, Civil Service (Medical): No    Lack of Transportation (Non-Medical): No  Physical Activity: Inactive (10/23/2023)   Exercise Vital Sign    Days of Exercise per Week: 0 days    Minutes of Exercise per Session: 0 min  Stress: Stress Concern Present (10/23/2023)   [employer] of Occupational Health - Occupational Stress Questionnaire    Feeling of Stress : Rather much  Social Connections: Moderately Integrated (10/23/2023)   Social Connection and Isolation Panel [NHANES]    Frequency of Communication with Friends and Family: More than three times a week    Frequency of Social Gatherings with Friends and Family: Once a week    Attends Religious Services: Never    Database Administrator or Organizations: Yes  Attends Engineer, Structural: More than 4 times per year    Marital Status: Married    Outpatient Medications Prior to Visit  Medication Sig Dispense Refill   Accu-Chek Softclix Lancets lancets Use as instructed 100 each 12   Biotin 5 MG CAPS Take 1 capsule by mouth daily.     buPROPion  (WELLBUTRIN  XL) 150 MG 24 hr tablet Take 450 mg by mouth every morning. Takes 3 tablets  a day.     Cholecalciferol (VITAMIN D3) 2000 units TABS Take 1 tablet by mouth daily.     clobetasol cream (TEMOVATE) 0.05 % APPLY TOPICALLY TO THE AFFECTED AREA TWICE DAILY FOR 14 DAYS     Eszopiclone  3 MG TABS Take 3 mg by mouth at bedtime as needed.     finasteride (PROSCAR) 5 MG tablet Take 5 mg by mouth daily. From Dermatologist     FLUoxetine (PROZAC) 40 MG capsule Take 80 mg by mouth every morning.     glucose blood (ACCU-CHEK GUIDE) test strip Use as instructed 100 each 12   ketoconazole (NIZORAL) 2 %  shampoo APPLY TOPICALLY 2 TO 3 TIMES A WEEK     loratadine  (CLARITIN ) 10 MG tablet Take 10 mg by mouth daily.     metFORMIN  (GLUCOPHAGE ) 850 MG tablet TAKE 1 TABLET BY MOUTH TWICE A DAY WITH FOOD SCHEDULE ANNUAL APPT FOR FUTURE REFILLS 180 tablet 1   MILI 0.25-35 MG-MCG tablet      rosuvastatin  (CRESTOR ) 10 MG tablet Take 1 tablet (10 mg total) by mouth daily. 30 tablet 3   spironolactone (ALDACTONE) 100 MG tablet Take 100 mg by mouth 2 (two) times daily.     SUMAtriptan  (IMITREX ) 100 MG tablet TAKE 1 TABLET BY MOUTH AT ONSET OF MIGRAINE AND MAY REPEAT IN 2 HOURS IF HEADACHE PERSISTS OR RECURS 12 tablet 1   triamcinolone cream (KENALOG) 0.1 % triamcinolone acetonide 0.1 % topical cream  APPLY EXTERNALLY TO THE AFFECTED AREA TWICE DAILY FOR 15 DAYS     zinc gluconate 50 MG tablet Take 50 mg by mouth daily.     Dulaglutide  (TRULICITY ) 1.5 MG/0.5ML SOPN Inject 1.5 mg into the skin once a week. Dosage change 2 mL 5   buPROPion  (WELLBUTRIN  XL) 300 MG 24 hr tablet TAKE 1 TABLET(300 MG) BY MOUTH EVERY MORNING 90 tablet 0   eszopiclone  (LUNESTA ) 2 MG TABS tablet TAKE 1 TABLET BY MOUTH EVERY NIGHT AT BEDTIME AS NEEDED FOR SLEEP 30 tablet 3   FLUoxetine (PROZAC) 20 MG capsule Take 60 mg by mouth every morning.     No facility-administered medications prior to visit.     EXAM:  BP 98/88 (BP Location: Left Arm, Patient Position: Sitting, Cuff Size: Large)   Pulse 98   Temp 97.8 F (36.6 C) (Oral)   Ht 5' 6.99 (1.702 m)   Wt 207 lb 12.8 oz (94.3 kg)   LMP 09/27/2023 (Exact Date)   SpO2 98%   BMI 32.56 kg/m   Body mass index is 32.56 kg/m. Wt Readings from Last 3 Encounters:  10/24/23 207 lb 12.8 oz (94.3 kg)  06/20/23 206 lb 12.8 oz (93.8 kg)  08/01/22 203 lb (92.1 kg)    GENERAL: vitals reviewed and listed above, alert, oriented, appears well hydrated and in no acute distress  LUNGS: clear to auscultation bilaterally, no wheezes, rales or rhonchi, good air movement CV: HRRR, no  clubbing cyanosis or  peripheral edema nl cap refill  MS: moves all extremities without noticeable focal  abnormality PSYCH: pleasant and cooperative, no obvious depression or anxiety though process  intact  see ph9 screening  Lab Results  Component Value Date   WBC 8.3 06/13/2023   HGB 14.0 06/13/2023   HCT 43.5 06/13/2023   PLT 334.0 06/13/2023   GLUCOSE 93 06/13/2023   CHOL 185 06/13/2023   TRIG 115.0 06/13/2023   HDL 82.50 06/13/2023   LDLDIRECT 170.0 04/18/2022   LDLCALC 79 06/13/2023   ALT 22 06/13/2023   AST 20 06/13/2023   NA 135 06/13/2023   K 4.3 06/13/2023   CL 99 06/13/2023   CREATININE 1.01 06/13/2023   BUN 23 06/13/2023   CO2 26 06/13/2023   TSH 3.45 06/13/2023   HGBA1C 6.2 (A) 10/24/2023   MICROALBUR 4.9 (H) 06/13/2023   BP Readings from Last 3 Encounters:  10/24/23 98/88  06/20/23 106/76  04/25/22 118/80    ASSESSMENT AND PLAN:  Discussed the following assessment and plan:  Controlled type 2 diabetes mellitus with complication, without long-term current use of insulin (HCC) - increase trulicity  to 3 mg weekly  monitor bg and fu 3-4 months consider semiglutide - Plan: POC HgB A1c  Medication management  Psychophysiological insomnia  Depression, unspecified depression type Reviewed  status and  interventions  -Patient advised to return or notify health care team  if  new concerns arise.  Patient Instructions  Good to see  you today   Try increase Trulicity  to 3 mg  dosing.  If  getting more dizziness   or fainting  sx may address the bp readings.    Seila Liston K. Devario Bucklew M.D.

## 2023-10-24 ENCOUNTER — Ambulatory Visit (INDEPENDENT_AMBULATORY_CARE_PROVIDER_SITE_OTHER): Payer: 59 | Admitting: Internal Medicine

## 2023-10-24 ENCOUNTER — Encounter: Payer: Self-pay | Admitting: Internal Medicine

## 2023-10-24 VITALS — BP 98/88 | HR 98 | Temp 97.8°F | Ht 66.99 in | Wt 207.8 lb

## 2023-10-24 DIAGNOSIS — Z79899 Other long term (current) drug therapy: Secondary | ICD-10-CM | POA: Diagnosis not present

## 2023-10-24 DIAGNOSIS — E118 Type 2 diabetes mellitus with unspecified complications: Secondary | ICD-10-CM

## 2023-10-24 DIAGNOSIS — F32A Depression, unspecified: Secondary | ICD-10-CM

## 2023-10-24 DIAGNOSIS — Z7985 Long-term (current) use of injectable non-insulin antidiabetic drugs: Secondary | ICD-10-CM

## 2023-10-24 DIAGNOSIS — F5104 Psychophysiologic insomnia: Secondary | ICD-10-CM

## 2023-10-24 LAB — POCT GLYCOSYLATED HEMOGLOBIN (HGB A1C): Hemoglobin A1C: 6.2 % — AB (ref 4.0–5.6)

## 2023-10-24 MED ORDER — TRULICITY 3 MG/0.5ML ~~LOC~~ SOAJ: 3.0000 mg | SUBCUTANEOUS | 3 refills | Status: DC

## 2023-10-24 NOTE — Patient Instructions (Addendum)
 Good to see  you today   Try increase Trulicity to 3 mg  dosing.  If  getting more dizziness   or fainting  sx may address the bp readings.

## 2023-11-14 DIAGNOSIS — F411 Generalized anxiety disorder: Secondary | ICD-10-CM | POA: Diagnosis not present

## 2023-11-14 DIAGNOSIS — F331 Major depressive disorder, recurrent, moderate: Secondary | ICD-10-CM | POA: Diagnosis not present

## 2023-11-21 ENCOUNTER — Other Ambulatory Visit: Payer: Self-pay | Admitting: Internal Medicine

## 2023-12-26 ENCOUNTER — Telehealth: Payer: Self-pay | Admitting: Internal Medicine

## 2023-12-26 NOTE — Telephone Encounter (Signed)
 Prescription Request  12/26/2023  LOV: 10/24/2023  What is the name of the medication or equipment? metFORMIN (GLUCOPHAGE) 850 MG tablet  Have you contacted your pharmacy to request a refill? Yes   Which pharmacy would you like this sent to?  [id number] [place], [place] - [address] [place] [address] Phone: [phone] Fax: [phone]   Patient notified that their request is being sent to the clinical staff for review and that they should receive a response within 2 business days.   Please advise at Mobile [phone] (mobile)

## 2023-12-28 ENCOUNTER — Other Ambulatory Visit: Payer: Self-pay

## 2023-12-28 MED ORDER — METFORMIN HCL 850 MG PO TABS: ORAL_TABLET | ORAL | 1 refills | Status: DC

## 2023-12-28 NOTE — Telephone Encounter (Signed)
 Rx sent

## 2024-01-09 ENCOUNTER — Telehealth: Payer: Self-pay | Admitting: Internal Medicine

## 2024-01-09 NOTE — Telephone Encounter (Signed)
 Pt's pharmacy is saying that the pt's PCP needs to do a prior authorization for Trulicity 3 mg.  Patient is now out of this medication.  [facility] [address] [facility] [place], [place]

## 2024-01-10 ENCOUNTER — Other Ambulatory Visit (HOSPITAL_COMMUNITY): Payer: Self-pay

## 2024-01-10 ENCOUNTER — Telehealth: Payer: Self-pay

## 2024-01-10 NOTE — Telephone Encounter (Signed)
 Notified pt to pick up Rx from her pharmacy

## 2024-01-10 NOTE — Telephone Encounter (Signed)
 Pharmacy Patient Advocate Encounter   Received notification from Pt Calls Messages that prior authorization for TRULICITY is required/requested.   Insurance verification completed.   The patient is insured through [employer] .   Per test claim: Refill too soon. PA is not needed at this time. Medication was filled 01/09/24. Next eligible fill date is 01/31/24.

## 2024-01-10 NOTE — Telephone Encounter (Signed)
 Per test claim: Refill too soon. PA is not needed at this time. Medication was filled 01/09/24. Next eligible fill date is 01/31/24.

## 2024-01-16 ENCOUNTER — Other Ambulatory Visit: Payer: Self-pay | Admitting: Internal Medicine

## 2024-01-23 ENCOUNTER — Other Ambulatory Visit: Payer: Self-pay | Admitting: Internal Medicine

## 2024-01-23 ENCOUNTER — Ambulatory Visit: Payer: 59 | Admitting: Internal Medicine

## 2024-01-23 ENCOUNTER — Encounter: Payer: Self-pay | Admitting: Internal Medicine

## 2024-01-23 VITALS — BP 112/72 | HR 87 | Temp 98.2°F | Ht 66.99 in | Wt 207.2 lb

## 2024-01-23 DIAGNOSIS — E785 Hyperlipidemia, unspecified: Secondary | ICD-10-CM

## 2024-01-23 DIAGNOSIS — E118 Type 2 diabetes mellitus with unspecified complications: Secondary | ICD-10-CM

## 2024-01-23 DIAGNOSIS — Z79899 Other long term (current) drug therapy: Secondary | ICD-10-CM

## 2024-01-23 DIAGNOSIS — Z0000 Encounter for general adult medical examination without abnormal findings: Secondary | ICD-10-CM

## 2024-01-23 DIAGNOSIS — Z7984 Long term (current) use of oral hypoglycemic drugs: Secondary | ICD-10-CM

## 2024-01-23 LAB — POCT GLYCOSYLATED HEMOGLOBIN (HGB A1C): Hemoglobin A1C: 5.9 % — AB (ref 4.0–5.6)

## 2024-01-23 LAB — MICROALBUMIN / CREATININE URINE RATIO
Creatinine,U: 258.8 mg/dL
Microalb Creat Ratio: 16.2 mg/g (ref 0.0–30.0)
Microalb, Ur: 4.2 mg/dL — ABNORMAL HIGH (ref 0.0–1.9)

## 2024-01-23 NOTE — Patient Instructions (Addendum)
 Good to see you today . A1c 5.9!  Get urine  for protein routine screen.  Plan lab and then cpe end August or September .

## 2024-01-23 NOTE — Progress Notes (Signed)
 Future labs for cpe in aug September

## 2024-01-23 NOTE — Progress Notes (Signed)
 Future labs to be done for cpe  aug September 25  reordered ( last  orders released in error)

## 2024-01-23 NOTE — Progress Notes (Signed)
 Chief Complaint  Patient presents with   Medical Management of Chronic Issues    Pt is here for her 3 moths f/u. Pt states she had issues with getting medications for metformin and Trucility. Was not on medication at least 10 days on metformin.  Was off on Trulicity for a month. Pt states she is back on them now.     HPI: Sandra Hudson 48 y.o. come in for Chronic disease management  fu dm  here with daughter today   DM   as above   but back on meds   feels ok  No persistnt numbness but can get compression numbness easier than in past?  Has fine  motor tremor some abut no shakes   limited caffiene in am  Psych care   ROS: See pertinent positives and negatives per HPI.  Past Medical History:  Diagnosis Date   Allergic rhinitis due to dust    Allergy    Breech presentation 01/29/2014   Female infertility    Headache(784.0)    Pre-eclampsia 01/29/2014   Pregnancy induced hypertension    Status post primary low transverse cesarean section 02/01/2014    Family History  Problem Relation Age of Onset   Depression Father    ALS Father        multiple system atropy   Depression Mother    Breast cancer Other        maternal great aunt   Breast cancer Paternal Grandmother    Colon cancer Neg Hx    Pancreatic cancer Neg Hx    Esophageal cancer Neg Hx     Social History   Socioeconomic History   Marital status: Married    Spouse name: Not on file   Number of children: 1   Years of education: Not on file   Highest education level: Bachelor's degree (e.g., BA, AB, BS)  Occupational History    Employer: UNEMPLOYED  Tobacco Use   Smoking status: Never   Smokeless tobacco: Never  Vaping Use   Vaping status: Never Used  Substance and Sexual Activity   Alcohol use: No    Comment: occasionally   Drug use: No   Sexual activity: Not on file  Other Topics Concern   Not on file  Social History Narrative   HH of 4   1 Cat   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   Social Drivers of Health   Financial Resource Strain: Medium Risk (10/23/2023)   Overall Financial Resource Strain (CARDIA)    Difficulty of Paying Living Expenses: Somewhat hard  Food Insecurity: No Food Insecurity (10/23/2023)   Hunger Vital Sign    Worried About Running Out of Food in the Last Year: Never true    Ran Out of Food in the Last Year: Never true  Transportation Needs: No Transportation Needs (10/23/2023)   PRAPARE - Administrator, Civil Service (Medical): No    Lack of Transportation (Non-Medical): No  Physical Activity: Inactive (10/23/2023)   Exercise Vital Sign    Days of Exercise per Week: 0 days    Minutes of Exercise per Session: 0 min  Stress: Stress Concern Present (10/23/2023)   [employer] of Occupational Health - Occupational Stress Questionnaire    Feeling of Stress : Rather much  Social Connections: Moderately Integrated (10/23/2023)   Social Connection and Isolation Panel [NHANES]  Frequency of Communication with Friends and Family: More than three times a week    Frequency of Social Gatherings with Friends and Family: Once a week    Attends Religious Services: Never    Database administrator or Organizations: Yes    Attends Engineer, structural: More than 4 times per year    Marital Status: Married    Outpatient Medications Prior to Visit  Medication Sig Dispense Refill   Accu-Chek Softclix Lancets lancets Use as instructed 100 each 12   Biotin 5 MG CAPS Take 1 capsule by mouth daily.     buPROPion (WELLBUTRIN XL) 150 MG 24 hr tablet Take 450 mg by mouth every morning. Takes 3 tablets  a day.     Cholecalciferol (VITAMIN D3) 2000 units TABS Take 1 tablet by mouth daily.     clobetasol cream (TEMOVATE) 0.05 % APPLY TOPICALLY TO THE AFFECTED AREA TWICE DAILY FOR 14 DAYS     Dulaglutide (TRULICITY) 3 MG/0.5ML SOAJ Inject 3 mg as directed once a week. 2 mL 3   Eszopiclone 3 MG TABS Take 3 mg by mouth  at bedtime as needed.     finasteride (PROSCAR) 5 MG tablet Take 5 mg by mouth daily. From Dermatologist     FLUoxetine (PROZAC) 40 MG capsule Take 80 mg by mouth every morning.     glucose blood (ACCU-CHEK GUIDE) test strip Use as instructed 100 each 12   ketoconazole (NIZORAL) 2 % shampoo APPLY TOPICALLY 2 TO 3 TIMES A WEEK     loratadine (CLARITIN) 10 MG tablet Take 10 mg by mouth daily.     metFORMIN (GLUCOPHAGE) 850 MG tablet TAKE 1 TABLET BY MOUTH TWICE A DAY WITH FOOD; SCHEDULE ANNUAL APPT FOR FUTURE REFILLS 180 tablet 1   MILI 0.25-35 MG-MCG tablet      rosuvastatin (CRESTOR) 10 MG tablet TAKE 1 TABLET BY MOUTH EVERY DAY 30 tablet 3   spironolactone (ALDACTONE) 100 MG tablet Take 100 mg by mouth 2 (two) times daily.     SUMAtriptan (IMITREX) 100 MG tablet TAKE 1 TABLET BY MOUTH AT ONSET OF MIGRAINE AND MAY REPEAT IN 2 HOURS IF HEADACHE PERSISTS OR RECURS 12 tablet 1   triamcinolone cream (KENALOG) 0.1 % triamcinolone acetonide 0.1 % topical cream  APPLY EXTERNALLY TO THE AFFECTED AREA TWICE DAILY FOR 15 DAYS     zinc gluconate 50 MG tablet Take 50 mg by mouth daily.     No facility-administered medications prior to visit.     EXAM:  BP 112/72 (BP Location: Right Arm, Patient Position: Sitting, Cuff Size: Large)   Pulse 87   Temp 98.2 F (36.8 C) (Oral)   Ht 5' 6.99" (1.702 m)   Wt 207 lb 3.2 oz (94 kg)   LMP 01/14/2024 (Exact Date)   SpO2 98%   BMI 32.46 kg/m   Body mass index is 32.46 kg/m. Wt Readings from Last 3 Encounters:  01/23/24 207 lb 3.2 oz (94 kg)  10/24/23 207 lb 12.8 oz (94.3 kg)  06/20/23 206 lb 12.8 oz (93.8 kg)    GENERAL: vitals reviewed and listed above, alert, oriented, appears well hydrated and in no acute distress HEENT: atraumatic, conjunctiva  clear, no obvious abnormalities on inspection of external nose and ears  NECK: no obvious masses on inspection palpation  LUNGS: clear to auscultation bilaterally, no wheezes, rales or rhonchi, good air  movement CV: HRRR, no clubbing cyanosis or  peripheral edema nl cap refill  MS: moves  all extremities without noticeable focal  abnormality PSYCH: pleasant and cooperative, no obvious depression or anxiety Minor fine tremor hands  no rigidity or cog wheeling  Lab Results  Component Value Date   WBC 8.3 06/13/2023   HGB 14.0 06/13/2023   HCT 43.5 06/13/2023   PLT 334.0 06/13/2023   GLUCOSE 93 06/13/2023   CHOL 185 06/13/2023   TRIG 115.0 06/13/2023   HDL 82.50 06/13/2023   LDLDIRECT 170.0 04/18/2022   LDLCALC 79 06/13/2023   ALT 22 06/13/2023   AST 20 06/13/2023   NA 135 06/13/2023   K 4.3 06/13/2023   CL 99 06/13/2023   CREATININE 1.01 06/13/2023   BUN 23 06/13/2023   CO2 26 06/13/2023   TSH 3.45 06/13/2023   HGBA1C 5.9 (A) 01/23/2024   MICROALBUR 4.9 (H) 06/13/2023   BP Readings from Last 3 Encounters:  01/23/24 112/72  10/24/23 98/88  06/20/23 106/76    ASSESSMENT AND PLAN:  Discussed the following assessment and plan:  Controlled type 2 diabetes mellitus with complication, without long-term current use of insulin (HCC) - Plan: POC HgB A1c, Microalbumin / creatinine urine ratio, Microalbumin / creatinine urine ratio, TSH, Lipid panel, Hepatic function panel, Hemoglobin A1c, CBC with Differential/Platelet, Basic metabolic panel with GFR, Vitamin B12  Medication management  Hyperlipidemia, unspecified hyperlipidemia type Good range and med tolerating although refill delay was off for  a bit.  No obv se of med  Fine tremor could be med related  seems benign will follow.  Mood seems stable  on high dose Wellbutrin  Will place future orders for cpe and  monitoring  -Patient advised to return or notify health care team  if  new concerns arise.  Patient Instructions  Good to see you today . A1c 5.9!  Get urine  for protein routine screen.  Plan lab and then cpe end August or September .    Neta Mends. Davidmichael Zarazua M.D.

## 2024-01-24 ENCOUNTER — Encounter: Payer: Self-pay | Admitting: Internal Medicine

## 2024-01-24 NOTE — Progress Notes (Signed)
 Urine  shows no  increase protein  stable

## 2024-03-12 DIAGNOSIS — F411 Generalized anxiety disorder: Secondary | ICD-10-CM | POA: Diagnosis not present

## 2024-03-12 DIAGNOSIS — F331 Major depressive disorder, recurrent, moderate: Secondary | ICD-10-CM | POA: Diagnosis not present

## 2024-03-19 ENCOUNTER — Other Ambulatory Visit: Payer: Self-pay | Admitting: Internal Medicine

## 2024-04-16 NOTE — Progress Notes (Signed)
 [facility] Quality Team Note  Name: Sandra Hudson Date of Birth: Jan 31, 1976 MRN: [id number] Date: 04/16/2024  [facility] Quality Team has reviewed this patient's chart, please see recommendations below:  [facility] Quality Other; (Chart reviewed. Patient needs eGFR for gap closure)

## 2024-04-25 ENCOUNTER — Other Ambulatory Visit: Payer: Self-pay | Admitting: Internal Medicine

## 2024-04-25 DIAGNOSIS — F411 Generalized anxiety disorder: Secondary | ICD-10-CM | POA: Diagnosis not present

## 2024-04-25 DIAGNOSIS — F331 Major depressive disorder, recurrent, moderate: Secondary | ICD-10-CM | POA: Diagnosis not present

## 2024-05-06 DIAGNOSIS — G43909 Migraine, unspecified, not intractable, without status migrainosus: Secondary | ICD-10-CM | POA: Diagnosis not present

## 2024-05-15 ENCOUNTER — Other Ambulatory Visit: Payer: Self-pay | Admitting: Internal Medicine

## 2024-05-29 ENCOUNTER — Other Ambulatory Visit: Payer: Self-pay | Admitting: Internal Medicine

## 2024-06-13 ENCOUNTER — Other Ambulatory Visit (HOSPITAL_COMMUNITY): Payer: Self-pay

## 2024-06-13 ENCOUNTER — Other Ambulatory Visit: Payer: Self-pay | Admitting: Internal Medicine

## 2024-06-13 ENCOUNTER — Ambulatory Visit: Admitting: Internal Medicine

## 2024-06-13 ENCOUNTER — Encounter: Payer: Self-pay | Admitting: Internal Medicine

## 2024-06-13 VITALS — BP 104/76 | HR 90 | Temp 97.8°F | Ht 66.99 in | Wt 196.2 lb

## 2024-06-13 DIAGNOSIS — E118 Type 2 diabetes mellitus with unspecified complications: Secondary | ICD-10-CM

## 2024-06-13 DIAGNOSIS — G43C0 Periodic headache syndromes in child or adult, not intractable: Secondary | ICD-10-CM | POA: Diagnosis not present

## 2024-06-13 DIAGNOSIS — Z79899 Other long term (current) drug therapy: Secondary | ICD-10-CM

## 2024-06-13 LAB — POCT GLYCOSYLATED HEMOGLOBIN (HGB A1C): Hemoglobin A1C: 5.9 % — AB (ref 4.0–5.6)

## 2024-06-13 MED ORDER — NURTEC 75 MG PO TBDP: 75.0000 mg | ORAL_TABLET | Freq: Every day | ORAL | 1 refills | Status: DC | PRN

## 2024-06-13 NOTE — Patient Instructions (Addendum)
 Try different migraine headache.  Med that may be more effective.   May need a PA .  Make appt  cpe .  In the fall nov December   get the labs pre visit

## 2024-06-13 NOTE — Telephone Encounter (Signed)
 Pharmacy Patient Advocate Encounter   Received notification from RX Request Messages that prior authorization for Nurtec 75 is required/requested.   Insurance verification completed.   The patient is insured through [facility] .   Per test claim: PA required; PA submitted to above mentioned insurance via Latent Key/confirmation #/EOC [id number] Status is pending Submitted sumatriptan  fail

## 2024-06-13 NOTE — Progress Notes (Signed)
 Chief Complaint  Patient presents with   Medical Management of Chronic Issues   Migraine    Pt c/o migraine all week long. Had to go to [facility] for headache. Takes sumatriptan . Was working in the past. Recently no relief.     HPI: Sandra Hudson 48 y.o. come in for Chronic disease management  Cpe due this fall     Increase MHAs    has since July    frequency .   Sumatriptan  works not all the time  but using a lot   not sure of reason no fever  illness otherwise  typical migraine. Has calendar. Has seen [facility] and local.  No change in other meds   Aleve every day   for neck shoulders.    Trulicity  has lost weight and some help woth this  not as hungry .  Pleased with result  am bg in low 100s if eats best   Diet  acknowledge .  And attention to healthy options. . Depression mood is  stable doing ok   ROS: See pertinent positives and negatives per HPI. No cv resp sx    Past Medical History:  Diagnosis Date   Allergic rhinitis due to dust    Allergy    Breech presentation 01/29/2014   Female infertility    Headache(784.0)    Pre-eclampsia 01/29/2014   Pregnancy induced hypertension    Status post primary low transverse cesarean section 02/01/2014    Family History  Problem Relation Age of Onset   Depression Father    ALS Father        multiple system atropy   Depression Mother    Breast cancer Other        maternal great aunt   Breast cancer Paternal Grandmother    Colon cancer Neg Hx    Pancreatic cancer Neg Hx    Esophageal cancer Neg Hx     Social History   Socioeconomic History   Marital status: Married    Spouse name: Not on file   Number of children: 1   Years of education: Not on file   Highest education level: Bachelor's degree (e.g., BA, AB, BS)  Occupational History    Employer: UNEMPLOYED  Tobacco Use   Smoking status: Never   Smokeless tobacco: Never  Vaping Use   Vaping status: Never Used  Substance and Sexual Activity   Alcohol use: No    Comment:  occasionally   Drug use: No   Sexual activity: Not on file  Other Topics Concern   Not on file  Social History Narrative   HH of 4   1 Cat   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   Social Drivers of Health   Financial Resource Strain: Medium Risk (10/23/2023)   Overall Financial Resource Strain (CARDIA)    Difficulty of Paying Living Expenses: Somewhat hard  Food Insecurity: No Food Insecurity (10/23/2023)   Hunger Vital Sign    Worried About Running Out of Food in the Last Year: Never true    Ran Out of Food in the Last Year: Never true  Transportation Needs: No Transportation Needs (10/23/2023)   PRAPARE - Administrator, Civil Service (Medical): No    Lack of Transportation (Non-Medical): No  Physical Activity: Inactive (10/23/2023)   Exercise Vital Sign    Days of Exercise per Week: 0 days  Minutes of Exercise per Session: 0 min  Stress: Stress Concern Present (10/23/2023)   [employer] of Occupational Health - Occupational Stress Questionnaire    Feeling of Stress : Rather much  Social Connections: Moderately Integrated (10/23/2023)   Social Connection and Isolation Panel    Frequency of Communication with Friends and Family: More than three times a week    Frequency of Social Gatherings with Friends and Family: Once a week    Attends Religious Services: Never    Database administrator or Organizations: Yes    Attends Engineer, structural: More than 4 times per year    Marital Status: Married    Outpatient Medications Prior to Visit  Medication Sig Dispense Refill   Accu-Chek Softclix Lancets lancets Use as instructed 100 each 12   Biotin 5 MG CAPS Take 1 capsule by mouth daily.     buPROPion  (WELLBUTRIN  XL) 150 MG 24 hr tablet Take 450 mg by mouth every morning. Takes 3 tablets  a day.     Butalbital -APAP-Caffeine  50-300-40 MG CAPS Take 1 capsule by mouth every 4 (four) hours as needed.      Cholecalciferol (VITAMIN D3) 2000 units TABS Take 1 tablet by mouth daily.     clobetasol cream (TEMOVATE) 0.05 % APPLY TOPICALLY TO THE AFFECTED AREA TWICE DAILY FOR 14 DAYS     Dulaglutide  (TRULICITY ) 3 MG/0.5ML SOAJ INJECT 3 MG AS DIRECTED ONCE A WEEK. 2 mL 3   eszopiclone  (LUNESTA ) 2 MG TABS tablet 2 mg at bedtime. For sleep     finasteride (PROSCAR) 5 MG tablet Take 5 mg by mouth daily. From Dermatologist     FLUoxetine (PROZAC) 40 MG capsule Take 80 mg by mouth every morning.     glucose blood (ACCU-CHEK GUIDE) test strip Use as instructed 100 each 12   ketoconazole (NIZORAL) 2 % shampoo APPLY TOPICALLY 2 TO 3 TIMES A WEEK     loratadine  (CLARITIN ) 10 MG tablet Take 10 mg by mouth daily.     metFORMIN  (GLUCOPHAGE ) 850 MG tablet TAKE 1 TABLET BY MOUTH TWICE A DAY WITH FOOD; SCHEDULE ANNUAL APPT FOR FUTURE REFILLS 180 tablet 1   rosuvastatin  (CRESTOR ) 10 MG tablet TAKE 1 TABLET BY MOUTH EVERY DAY 30 tablet 3   spironolactone (ALDACTONE) 100 MG tablet Take 100 mg by mouth 2 (two) times daily.     SUMAtriptan  (IMITREX ) 100 MG tablet TAKE 1 TABLET BY MOUTH AT ONSET OF MIGRAINE AND MAY REPEAT IN 2 HOURS IF HEADACHE PERSISTS OR RECURS 12 tablet 1   zinc gluconate 50 MG tablet Take 50 mg by mouth daily.     MILI 0.25-35 MG-MCG tablet  (Patient not taking: Reported on 06/13/2024)     triamcinolone cream (KENALOG) 0.1 % triamcinolone acetonide 0.1 % topical cream  APPLY EXTERNALLY TO THE AFFECTED AREA TWICE DAILY FOR 15 DAYS (Patient not taking: Reported on 06/13/2024)     Eszopiclone  3 MG TABS Take 3 mg by mouth at bedtime as needed.     No facility-administered medications prior to visit.     EXAM:  BP 104/76 (BP Location: Left Arm, Patient Position: Sitting, Cuff Size: Large)   Pulse 90   Temp 97.8 F (36.6 C) (Oral)   Ht 5' 6.99 (1.702 m)   Wt 196 lb 3.2 oz (89 kg)   LMP 06/09/2024 (Exact Date)   SpO2 97%   BMI 30.74 kg/m   Body mass index is 30.74 kg/m. Wt Readings from Last 3  Encounters:  06/13/24 196 lb 3.2 oz (89 kg)  01/23/24 207 lb 3.2 oz (94 kg)  10/24/23 207 lb 12.8 oz (94.3 kg)   BP Readings from Last 3 Encounters:  06/13/24 104/76  01/23/24 112/72  10/24/23 98/88     GENERAL: vitals reviewed and listed above, alert, oriented, appears well hydrated and in no acute distress HEENT: atraumatic, conjunctiva  clear, no obvious abnormalities on inspection of external nose and ears  NECK: no obvious masses on inspection palpation  LUNGS: clear to auscultation bilaterally, no wheezes, rales or rhonchi, good air movement CV: HRRR, no clubbing cyanosis or  peripheral edema nl cap refill  MS: moves all extremities without noticeable focal  abnormality PSYCH: pleasant and cooperative, no obvious depression or anxiety Lab Results  Component Value Date   WBC 8.3 06/13/2023   HGB 14.0 06/13/2023   HCT 43.5 06/13/2023   PLT 334.0 06/13/2023   GLUCOSE 93 06/13/2023   CHOL 185 06/13/2023   TRIG 115.0 06/13/2023   HDL 82.50 06/13/2023   LDLDIRECT 170.0 04/18/2022   LDLCALC 79 06/13/2023   ALT 22 06/13/2023   AST 20 06/13/2023   NA 135 06/13/2023   K 4.3 06/13/2023   CL 99 06/13/2023   CREATININE 1.01 06/13/2023   BUN 23 06/13/2023   CO2 26 06/13/2023   TSH 3.45 06/13/2023   HGBA1C 5.9 (A) 06/13/2024   MICROALBUR 4.2 (H) 01/23/2024   BP Readings from Last 3 Encounters:  06/13/24 104/76  01/23/24 112/72  10/24/23 98/88    ASSESSMENT AND PLAN:  Discussed the following assessment and plan:  Controlled type 2 diabetes mellitus with complication, without long-term current use of insulin (HCC) - Plan: POC HgB A1c  Periodic headache syndrome, not intractable  Medication management Lab orders are in sytsem and  should have pv this fall   lab monitoring due  A1c is at goal today  so continue   no se se of med  Failure  of  sumatriptan .   .  For MHA   seems like her typical  consider hormonal or other  Order  nurtec as needed since sub optimum  response to sumatriptan  . Continue to track  and let us  know  . If having to use more than 110 - 15 d per month consider suppression or other .  Cont trulicity  -Patient advised to return or notify health care team  if  new concerns arise.  Patient Instructions  Try different migraine headache.  Med that may be more effective.   May need a PA .  Make appt  cpe .  In the fall nov December   get the labs pre visit    Leolia Vinzant K. Yailin Biederman M.D.

## 2024-06-14 ENCOUNTER — Other Ambulatory Visit (HOSPITAL_COMMUNITY): Payer: Self-pay

## 2024-06-14 MED ORDER — UBRELVY 100 MG PO TABS: ORAL_TABLET | ORAL | 1 refills | Status: AC

## 2024-06-14 NOTE — Telephone Encounter (Signed)
 Pharmacy Patient Advocate Encounter  Received notification from [facility] that Prior Authorization for Nurtec 75 has been DENIED.  Full denial letter will be uploaded to the media tab. See denial reason below.      PA #/Case ID/Reference #: [id number]

## 2024-06-18 ENCOUNTER — Telehealth: Payer: Self-pay

## 2024-06-18 ENCOUNTER — Other Ambulatory Visit (HOSPITAL_COMMUNITY): Payer: Self-pay

## 2024-06-18 ENCOUNTER — Other Ambulatory Visit (HOSPITAL_BASED_OUTPATIENT_CLINIC_OR_DEPARTMENT_OTHER): Payer: Self-pay

## 2024-06-18 NOTE — Telephone Encounter (Signed)
 Pharmacy Patient Advocate Encounter   Received notification from RX Request Messages that prior authorization for Ubrelvy  100 is required/requested.   Insurance verification completed.   The patient is insured through [facility] .   Per test claim: PA required; PA submitted to above mentioned insurance via Latent Key/confirmation #/EOC [id number] Status is pending

## 2024-06-18 NOTE — Telephone Encounter (Signed)
 Pharmacy Patient Advocate Encounter  Received notification from [facility] that Prior Authorization for Ubrelvy  100 has been DENIED.  Full denial letter will be uploaded to the media tab. See denial reason below.     PA #/Case ID/Reference #: # [id number]

## 2024-06-19 ENCOUNTER — Other Ambulatory Visit (HOSPITAL_COMMUNITY): Payer: Self-pay

## 2024-06-20 NOTE — Telephone Encounter (Signed)
 Contacted [employer] and spoke to [place] H., patient advocate, request to do appeal for Ubrelvy . She states she can fax over the form. She states form was faxed. She advise to attached supporting record for the claim.

## 2024-06-24 ENCOUNTER — Telehealth: Payer: Self-pay | Admitting: Internal Medicine

## 2024-06-24 NOTE — Telephone Encounter (Signed)
 Copied from CRM [id number]. Topic: Clinical - Medication Question >> Jun 24, 2024  1:25 PM Lauren C wrote: Reason for CRM: [employer] contacting us  about Ubrelvy  prescription PA. Provided all updates available. She just wants to make sure this is still in progress.

## 2024-06-26 ENCOUNTER — Telehealth: Payer: Self-pay | Admitting: *Deleted

## 2024-06-26 NOTE — Telephone Encounter (Signed)
 Copied from CRM [id number]. Topic: Clinical - Medication Question >> Jun 24, 2024  1:25 PM Lauren C wrote: Reason for CRM: [employer] contacting us  about Ubrelvy  prescription PA. Provided all updates available. She just wants to make sure this is still in progress. >> Jun 26, 2024 12:45 PM [place] J wrote: Patient is returning a call from [place] in regards to her Mamie. I as able to inform the patient on the message left and she acknowledge with no further questions at this time.

## 2024-06-26 NOTE — Telephone Encounter (Signed)
 Attempted to contact pt several times. Able to hear pt, pt was not able to hear this cma. Left a voicemail on third attempt.

## 2024-06-26 NOTE — Telephone Encounter (Signed)
 Form was received. However it was not an appeal form. Fill out an appeal form online and faxed the form including office notes, Rx Ubrelvy , pt's insurance.

## 2024-06-30 ENCOUNTER — Other Ambulatory Visit: Payer: Self-pay | Admitting: Internal Medicine

## 2024-07-04 DIAGNOSIS — Z124 Encounter for screening for malignant neoplasm of cervix: Secondary | ICD-10-CM | POA: Diagnosis not present

## 2024-07-04 DIAGNOSIS — Z1231 Encounter for screening mammogram for malignant neoplasm of breast: Secondary | ICD-10-CM | POA: Diagnosis not present

## 2024-07-04 DIAGNOSIS — Z01419 Encounter for gynecological examination (general) (routine) without abnormal findings: Secondary | ICD-10-CM | POA: Diagnosis not present

## 2024-07-06 ENCOUNTER — Other Ambulatory Visit: Payer: Self-pay | Admitting: Internal Medicine

## 2024-07-09 ENCOUNTER — Other Ambulatory Visit (HOSPITAL_COMMUNITY): Payer: Self-pay

## 2024-07-09 ENCOUNTER — Other Ambulatory Visit: Payer: Self-pay | Admitting: *Deleted

## 2024-07-09 MED ORDER — SUMATRIPTAN SUCCINATE 100 MG PO TABS: ORAL_TABLET | ORAL | 1 refills | Status: AC

## 2024-07-09 NOTE — Telephone Encounter (Signed)
 Still have not receive any update on the appeal. Contacted pt's insurance. The representative inform that the status is still showing  under review. Deadline for review is 10/10. Pt provided case ID [id number].   Update pt. Pt is aware.

## 2024-07-09 NOTE — Telephone Encounter (Signed)
 Patient came into the office inquiring about a refill of Imitrex  or a prescription for Ubrelvy .  I spoke with Dr Charlett and a refill for Imitrex  was sent.  I spoke with the pharmacist and Ubrelvy  will need a PA.  Forwarded to the PA team.

## 2024-07-10 ENCOUNTER — Other Ambulatory Visit (HOSPITAL_COMMUNITY): Payer: Self-pay

## 2024-07-10 DIAGNOSIS — H5213 Myopia, bilateral: Secondary | ICD-10-CM | POA: Diagnosis not present

## 2024-07-10 DIAGNOSIS — H33322 Round hole, left eye: Secondary | ICD-10-CM | POA: Diagnosis not present

## 2024-07-16 DIAGNOSIS — F3289 Other specified depressive episodes: Secondary | ICD-10-CM | POA: Diagnosis not present

## 2024-07-18 DIAGNOSIS — H33322 Round hole, left eye: Secondary | ICD-10-CM | POA: Diagnosis not present

## 2024-07-19 NOTE — Progress Notes (Addendum)
 Sandra Hudson                                          MRN: [id number]   07/19/2024   The [facility] Quality Team Specialist reviewed this patient medical record for the purposes of chart review for care gap closure. The following were reviewed: chart review for care gap closure-kidney health evaluation for diabetes:eGFR  and uACR. Lab appt 09/03/2024  09/26/2024- ABSTRACTED [facility]    [facility] Quality Team

## 2024-07-19 NOTE — Telephone Encounter (Signed)
 Contacted Education administrator to follow up. Left a voicemail at [phone] with pt's name and case number.

## 2024-07-22 DIAGNOSIS — F3289 Other specified depressive episodes: Secondary | ICD-10-CM | POA: Diagnosis not present

## 2024-07-23 DIAGNOSIS — F411 Generalized anxiety disorder: Secondary | ICD-10-CM | POA: Diagnosis not present

## 2024-07-23 DIAGNOSIS — F331 Major depressive disorder, recurrent, moderate: Secondary | ICD-10-CM | POA: Diagnosis not present

## 2024-07-23 NOTE — Telephone Encounter (Signed)
 Attempted to follow up contact number below. Left a voicemail to contact us  back.

## 2024-07-29 DIAGNOSIS — F3289 Other specified depressive episodes: Secondary | ICD-10-CM | POA: Diagnosis not present

## 2024-08-05 DIAGNOSIS — F3289 Other specified depressive episodes: Secondary | ICD-10-CM | POA: Diagnosis not present

## 2024-08-15 DIAGNOSIS — H5213 Myopia, bilateral: Secondary | ICD-10-CM | POA: Diagnosis not present

## 2024-08-15 DIAGNOSIS — H59812 Chorioretinal scars after surgery for detachment, left eye: Secondary | ICD-10-CM | POA: Diagnosis not present

## 2024-08-19 DIAGNOSIS — F3289 Other specified depressive episodes: Secondary | ICD-10-CM | POA: Diagnosis not present

## 2024-08-25 ENCOUNTER — Other Ambulatory Visit: Payer: Self-pay | Admitting: Internal Medicine

## 2024-09-03 ENCOUNTER — Other Ambulatory Visit (INDEPENDENT_AMBULATORY_CARE_PROVIDER_SITE_OTHER)

## 2024-09-03 DIAGNOSIS — E118 Type 2 diabetes mellitus with unspecified complications: Secondary | ICD-10-CM | POA: Diagnosis not present

## 2024-09-03 DIAGNOSIS — Z79899 Other long term (current) drug therapy: Secondary | ICD-10-CM

## 2024-09-03 DIAGNOSIS — E785 Hyperlipidemia, unspecified: Secondary | ICD-10-CM | POA: Diagnosis not present

## 2024-09-03 DIAGNOSIS — Z0000 Encounter for general adult medical examination without abnormal findings: Secondary | ICD-10-CM

## 2024-09-03 LAB — BASIC METABOLIC PANEL WITH GFR
BUN: 11 mg/dL (ref 6–23)
CO2: 26 meq/L (ref 19–32)
Calcium: 9.8 mg/dL (ref 8.4–10.5)
Chloride: 101 meq/L (ref 96–112)
Creatinine, Ser: 1.1 mg/dL (ref 0.40–1.20)
GFR: 60.08 mL/min (ref >60.00)
Glucose, Bld: 112 mg/dL — ABNORMAL HIGH (ref 70–99)
Potassium: 4.4 meq/L (ref 3.5–5.1)
Sodium: 137 meq/L (ref 135–145)

## 2024-09-03 LAB — MICROALBUMIN / CREATININE URINE RATIO
Creatinine,U: 402.3 mg/dL
Microalb Creat Ratio: 7.2 mg/g (ref 0.0–30.0)
Microalb, Ur: 2.9 mg/dL — ABNORMAL HIGH (ref 0.0–1.9)

## 2024-09-03 LAB — HEPATIC FUNCTION PANEL
ALT: 13 U/L (ref 0–35)
AST: 16 U/L (ref 0–37)
Albumin: 4.1 g/dL (ref 3.5–5.2)
Alkaline Phosphatase: 59 U/L (ref 39–117)
Bilirubin, Direct: 0 mg/dL (ref 0.0–0.3)
Total Bilirubin: 0.3 mg/dL (ref 0.2–1.2)
Total Protein: 7.7 g/dL (ref 6.0–8.3)

## 2024-09-03 LAB — CBC WITH DIFFERENTIAL/PLATELET
Basophils Absolute: 0 K/uL (ref 0.0–0.1)
Basophils Relative: 0.6 % (ref 0.0–3.0)
Eosinophils Absolute: 0.1 K/uL (ref 0.0–0.7)
Eosinophils Relative: 1.1 % (ref 0.0–5.0)
HCT: 39.8 % (ref 36.0–46.0)
Hemoglobin: 13.2 g/dL (ref 12.0–15.0)
Lymphocytes Relative: 27.2 % (ref 12.0–46.0)
Lymphs Abs: 1.6 K/uL (ref 0.7–4.0)
MCHC: 33.1 g/dL (ref 30.0–36.0)
MCV: 83.4 fl (ref 78.0–100.0)
Monocytes Absolute: 0.4 K/uL (ref 0.1–1.0)
Monocytes Relative: 6 % (ref 3.0–12.0)
Neutro Abs: 3.8 K/uL (ref 1.4–7.7)
Neutrophils Relative %: 65.1 % (ref 43.0–77.0)
Platelets: 368 K/uL (ref 150.0–400.0)
RBC: 4.77 Mil/uL (ref 3.87–5.11)
RDW: 13.7 % (ref 11.5–15.5)
WBC: 5.9 K/uL (ref 4.0–10.5)

## 2024-09-03 LAB — LIPID PANEL
Cholesterol: 180 mg/dL (ref 0–200)
HDL: 76.9 mg/dL (ref >39.00)
LDL Cholesterol: 68 mg/dL (ref 0–99)
NonHDL: 103.46
Total CHOL/HDL Ratio: 2
Triglycerides: 177 mg/dL — ABNORMAL HIGH (ref 0.0–149.0)
VLDL: 35.4 mg/dL (ref 0.0–40.0)

## 2024-09-03 LAB — TSH: TSH: 4.68 u[IU]/mL (ref 0.35–5.50)

## 2024-09-03 LAB — HEMOGLOBIN A1C: Hgb A1c MFr Bld: 5.7 % (ref 4.6–6.5)

## 2024-09-03 LAB — VITAMIN B12: Vitamin B-12: 321 pg/mL (ref 211–911)

## 2024-09-09 DIAGNOSIS — F3289 Other specified depressive episodes: Secondary | ICD-10-CM | POA: Diagnosis not present

## 2024-09-18 NOTE — Progress Notes (Unsigned)
 No chief complaint on file.   HPI: Patient  Sandra Hudson  48 y.o. comes in today for Preventive Health Care visit   Health Maintenance  Topic Date Due   FOOT EXAM  Never done   Pneumococcal Vaccine (1 of 2 - PCV) Never done   Hepatitis B Vaccines 19-59 Average Risk (1 of 3 - 19+ 3-dose series) Never done   OPHTHALMOLOGY EXAM  10/28/2023   DTaP/Tdap/Td (3 - Td or Tdap) 02/02/2024   Influenza Vaccine  05/17/2024   COVID-19 Vaccine (4 - 2025-26 season) 06/17/2024   HEMOGLOBIN A1C  03/03/2025   Mammogram  04/09/2025   Diabetic kidney evaluation - eGFR measurement  09/03/2025   Diabetic kidney evaluation - Urine ACR  09/03/2025   Cervical Cancer Screening (HPV/Pap Cotest)  02/24/2026   Colonoscopy  09/21/2030   Hepatitis C Screening  Completed   HIV Screening  Completed   HPV VACCINES  Aged Out   Meningococcal B Vaccine  Aged Out   Health Maintenance Review LIFESTYLE:  Exercise:   Tobacco/ETS: Alcohol:  Sugar beverages: Sleep: Drug use: no HH of  Work:    ROS:  GEN/ HEENT: No fever, significant weight changes sweats headaches vision problems hearing changes, CV/ PULM; No chest pain shortness of breath cough, syncope,edema  change in exercise tolerance. GI /GU: No adominal pain, vomiting, change in bowel habits. No blood in the stool. No significant GU symptoms. SKIN/HEME: ,no acute skin rashes suspicious lesions or bleeding. No lymphadenopathy, nodules, masses.  NEURO/ PSYCH:  No neurologic signs such as weakness numbness. No depression anxiety. IMM/ Allergy: No unusual infections.  Allergy .   REST of 12 system review negative except as per HPI   Past Medical History:  Diagnosis Date   Allergic rhinitis due to dust    Allergy    Breech presentation 01/29/2014   Female infertility    Headache(784.0)    Pre-eclampsia 01/29/2014   Pregnancy induced hypertension    Status post primary low transverse cesarean section 02/01/2014    Past Surgical History:   Procedure Laterality Date   CESAREAN SECTION N/A 01/31/2014   Procedure: CESAREAN SECTION;  Surgeon: Jon CINDERELLA Rummer, MD;  Location: [facility];  Service: Obstetrics;  Laterality: N/A;   COLONOSCOPY  09/16/2011   NASAL SEPTUM SURGERY     deviated septum and large turbinate    Family History  Problem Relation Age of Onset   Depression Father    ALS Father        multiple system atropy   Depression Mother    Breast cancer Other        maternal great aunt   Breast cancer Paternal Grandmother    Colon cancer Neg Hx    Pancreatic cancer Neg Hx    Esophageal cancer Neg Hx     Social History   Socioeconomic History   Marital status: Married    Spouse name: Not on file   Number of children: 1   Years of education: Not on file   Highest education level: Bachelor's degree (e.g., BA, AB, BS)  Occupational History    Employer: UNEMPLOYED  Tobacco Use   Smoking status: Never   Smokeless tobacco: Never  Vaping Use   Vaping status: Never Used  Substance and Sexual Activity   Alcohol use: No    Comment: occasionally   Drug use: No   Sexual activity: Not on file  Other Topics Concern   Not on file  Social History Narrative  HH of 4   1 Cat   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   Social Drivers of Health   Financial Resource Strain: Medium Risk (10/23/2023)   Overall Financial Resource Strain (CARDIA)    Difficulty of Paying Living Expenses: Somewhat hard  Food Insecurity: No Food Insecurity (10/23/2023)   Hunger Vital Sign    Worried About Running Out of Food in the Last Year: Never true    Ran Out of Food in the Last Year: Never true  Transportation Needs: No Transportation Needs (10/23/2023)   PRAPARE - Administrator, Civil Service (Medical): No    Lack of Transportation (Non-Medical): No  Physical Activity: Inactive (10/23/2023)   Exercise Vital Sign    Days of Exercise per Week: 0 days    Minutes of Exercise per  Session: 0 min  Stress: Stress Concern Present (10/23/2023)   [employer] of Occupational Health - Occupational Stress Questionnaire    Feeling of Stress : Rather much  Social Connections: Moderately Integrated (10/23/2023)   Social Connection and Isolation Panel    Frequency of Communication with Friends and Family: More than three times a week    Frequency of Social Gatherings with Friends and Family: Once a week    Attends Religious Services: Never    Database Administrator or Organizations: Yes    Attends Engineer, Structural: More than 4 times per year    Marital Status: Married    Outpatient Medications Prior to Visit  Medication Sig Dispense Refill   Accu-Chek Softclix Lancets lancets Use as instructed 100 each 12   Biotin 5 MG CAPS Take 1 capsule by mouth daily.     buPROPion  (WELLBUTRIN  XL) 150 MG 24 hr tablet Take 450 mg by mouth every morning. Takes 3 tablets  a day.     Butalbital -APAP-Caffeine  50-300-40 MG CAPS Take 1 capsule by mouth every 4 (four) hours as needed.     Cholecalciferol (VITAMIN D3) 2000 units TABS Take 1 tablet by mouth daily.     clobetasol cream (TEMOVATE) 0.05 % APPLY TOPICALLY TO THE AFFECTED AREA TWICE DAILY FOR 14 DAYS     Dulaglutide  (TRULICITY ) 3 MG/0.5ML SOAJ INJECT 3 MG AS DIRECTED ONCE A WEEK. 2 mL 3   eszopiclone  (LUNESTA ) 2 MG TABS tablet 2 mg at bedtime. For sleep     finasteride (PROSCAR) 5 MG tablet Take 5 mg by mouth daily. From Dermatologist     FLUoxetine (PROZAC) 40 MG capsule Take 80 mg by mouth every morning.     glucose blood (ACCU-CHEK GUIDE) test strip Use as instructed 100 each 12   ketoconazole (NIZORAL) 2 % shampoo APPLY TOPICALLY 2 TO 3 TIMES A WEEK     loratadine  (CLARITIN ) 10 MG tablet Take 10 mg by mouth daily.     metFORMIN  (GLUCOPHAGE ) 850 MG tablet TAKE 1 TABLET BY MOUTH TWICE A DAY WITH FOOD SCHEDULE ANNUAL APPT FOR FUTURE REFILLS 60 tablet 5   MILI 0.25-35 MG-MCG tablet  (Patient not taking: Reported on  06/13/2024)     NURTEC 75 MG TBDP PLACE 1 TABLET (75 MG TOTAL) UNDER THE TONGUE DAILY AS NEEDED (AS NEEDED FOR MIGRAINE HEADACHE). FAILED SUMATRIPTIN 30 tablet 1   rosuvastatin  (CRESTOR ) 10 MG tablet TAKE 1 TABLET BY MOUTH EVERY DAY 30 tablet 3   spironolactone (ALDACTONE) 100 MG tablet Take 100 mg by mouth 2 (  two) times daily.     SUMAtriptan  (IMITREX ) 100 MG tablet TAKE 1 TABLET BY MOUTH AT ONSET OF MIGRAINE AND MAY REPEAT IN 2 HOURS IF HEADACHE PERSISTS OR RECURS 12 tablet 1   triamcinolone cream (KENALOG) 0.1 % triamcinolone acetonide 0.1 % topical cream  APPLY EXTERNALLY TO THE AFFECTED AREA TWICE DAILY FOR 15 DAYS (Patient not taking: Reported on 06/13/2024)     Ubrogepant  (UBRELVY ) 100 MG TABS 100 mg orally if needed for migraine, if needed a second dose may be taken at least 2 hours after the initial dose; MAX, 200 mg/24 hrs; 30 tablet 1   zinc gluconate 50 MG tablet Take 50 mg by mouth daily.     No facility-administered medications prior to visit.     EXAM:  There were no vitals taken for this visit.  There is no height or weight on file to calculate BMI. Wt Readings from Last 3 Encounters:  06/13/24 196 lb 3.2 oz (89 kg)  01/23/24 207 lb 3.2 oz (94 kg)  10/24/23 207 lb 12.8 oz (94.3 kg)    Physical Exam: Vital signs reviewed HZW:Uypd is a well-developed well-nourished alert cooperative    who appearsr stated age in no acute distress.  HEENT: normocephalic atraumatic , Eyes: PERRL EOM's full, conjunctiva clear, Nares: paten,t no deformity discharge or tenderness., Ears: no deformity EAC's clear TMs with normal landmarks. Mouth: clear OP, no lesions, edema.  Moist mucous membranes. Dentition in adequate repair. NECK: supple without masses, thyromegaly or bruits. CHEST/PULM:  Clear to auscultation and percussion breath sounds equal no wheeze , rales or rhonchi. No chest wall deformities or tenderness. Breast: normal by inspection . No dimpling, discharge, masses, tenderness or  discharge . CV: PMI is nondisplaced, S1 S2 no gallops, murmurs, rubs. Peripheral pulses are full without delay.No JVD .  ABDOMEN: Bowel sounds normal nontender  No guard or rebound, no hepato splenomegal no CVA tenderness.  No hernia. Extremtities:  No clubbing cyanosis or edema, no acute joint swelling or redness no focal atrophy NEURO:  Oriented x3, cranial nerves 3-12 appear to be intact, no obvious focal weakness,gait within normal limits no abnormal reflexes or asymmetrical SKIN: No acute rashes normal turgor, color, no bruising or petechiae. PSYCH: Oriented, good eye contact, no obvious depression anxiety, cognition and judgment appear normal. LN: no cervical axillary inguinal adenopathy  Lab Results  Component Value Date   WBC 5.9 09/03/2024   HGB 13.2 09/03/2024   HCT 39.8 09/03/2024   PLT 368.0 09/03/2024   GLUCOSE 112 (H) 09/03/2024   CHOL 180 09/03/2024   TRIG 177.0 (H) 09/03/2024   HDL 76.90 09/03/2024   LDLDIRECT 170.0 04/18/2022   LDLCALC 68 09/03/2024   ALT 13 09/03/2024   AST 16 09/03/2024   NA 137 09/03/2024   K 4.4 09/03/2024   CL 101 09/03/2024   CREATININE 1.10 09/03/2024   BUN 11 09/03/2024   CO2 26 09/03/2024   TSH 4.68 09/03/2024   HGBA1C 5.7 09/03/2024   MICROALBUR 2.9 (H) 09/03/2024    BP Readings from Last 3 Encounters:  06/13/24 104/76  01/23/24 112/72  10/24/23 98/88    Lab results reviewed with patient   ASSESSMENT AND PLAN:  Discussed the following assessment and plan:    ICD-10-CM   1. Visit for preventive health examination  Z00.00     2. Medication management  Z79.899      No follow-ups on file.  Patient Care Team: Slyvia Lartigue K, MD as PCP - Diedre Ricker,  Vaughan, MD Verta Blossom, CNM as Midwife Pyrtle, Gordy HERO, MD (Gastroenterology) [place], Tawni CROME, MD as Referring Physician (Dermatology) myeyedr There are no Patient Instructions on file for this visit.  Harnoor Kohles K. Da Authement M.D.

## 2024-09-19 ENCOUNTER — Ambulatory Visit: Admitting: Internal Medicine

## 2024-09-19 ENCOUNTER — Encounter: Payer: Self-pay | Admitting: Internal Medicine

## 2024-09-19 VITALS — BP 122/82 | HR 66 | Temp 97.9°F | Ht 67.5 in | Wt 192.8 lb

## 2024-09-19 DIAGNOSIS — Z0000 Encounter for general adult medical examination without abnormal findings: Secondary | ICD-10-CM | POA: Diagnosis not present

## 2024-09-19 DIAGNOSIS — G43C0 Periodic headache syndromes in child or adult, not intractable: Secondary | ICD-10-CM

## 2024-09-19 DIAGNOSIS — Z23 Encounter for immunization: Secondary | ICD-10-CM | POA: Diagnosis not present

## 2024-09-19 DIAGNOSIS — Z79899 Other long term (current) drug therapy: Secondary | ICD-10-CM | POA: Diagnosis not present

## 2024-09-19 DIAGNOSIS — E785 Hyperlipidemia, unspecified: Secondary | ICD-10-CM

## 2024-09-19 DIAGNOSIS — E118 Type 2 diabetes mellitus with unspecified complications: Secondary | ICD-10-CM | POA: Diagnosis not present

## 2024-09-19 NOTE — Patient Instructions (Signed)
 Good to see  you today  Continue lifestyle intervention healthy eating and exercise . As planned  Same meds  at this tie  Optimize sleep .  Plan fu visit in 4-6 months or as needed

## 2024-09-23 DIAGNOSIS — F3289 Other specified depressive episodes: Secondary | ICD-10-CM | POA: Diagnosis not present

## 2024-10-01 ENCOUNTER — Other Ambulatory Visit: Payer: Self-pay | Admitting: Internal Medicine

## 2024-10-08 DIAGNOSIS — F3289 Other specified depressive episodes: Secondary | ICD-10-CM | POA: Diagnosis not present

## 2025-02-26 ENCOUNTER — Ambulatory Visit: Admitting: Internal Medicine
